# Patient Record
Sex: Female | Born: 1945 | Hispanic: Yes | Smoking: Former smoker
Health system: Southern US, Community
[De-identification: ages and names within clinical notes are randomized; demographics above are authoritative.]

## PROBLEM LIST (undated history)

## (undated) DIAGNOSIS — E079 Disorder of thyroid, unspecified: Secondary | ICD-10-CM

## (undated) DIAGNOSIS — I10 Essential (primary) hypertension: Secondary | ICD-10-CM

## (undated) DIAGNOSIS — I272 Pulmonary hypertension, unspecified: Secondary | ICD-10-CM

## (undated) DIAGNOSIS — J8410 Pulmonary fibrosis, unspecified: Secondary | ICD-10-CM

## (undated) DIAGNOSIS — M3500 Sicca syndrome, unspecified: Secondary | ICD-10-CM

## (undated) HISTORY — DX: Pulmonary fibrosis, unspecified: J84.10

## (undated) HISTORY — DX: Pulmonary hypertension, unspecified: I27.20

## (undated) HISTORY — DX: Sjogren syndrome, unspecified: M35.00

---

## 2012-10-19 ENCOUNTER — Ambulatory Visit: Payer: Self-pay | Admitting: Rheumatology

## 2012-10-20 ENCOUNTER — Ambulatory Visit: Payer: Self-pay | Admitting: Hematology and Oncology

## 2012-10-26 ENCOUNTER — Ambulatory Visit: Payer: Self-pay | Admitting: Hematology and Oncology

## 2012-10-26 LAB — COMPREHENSIVE METABOLIC PANEL
Albumin: 3.7 g/dL (ref 3.4–5.0)
Alkaline Phosphatase: 50 U/L (ref 50–136)
BUN: 15 mg/dL (ref 7–18)
Bilirubin,Total: 0.4 mg/dL (ref 0.2–1.0)
Chloride: 101 mmol/L (ref 98–107)
Co2: 28 mmol/L (ref 21–32)
Creatinine: 0.96 mg/dL (ref 0.60–1.30)
EGFR (Non-African Amer.): >60
Glucose: 162 mg/dL — ABNORMAL HIGH (ref 65–99)
Potassium: 3.7 mmol/L (ref 3.5–5.1)
SGOT(AST): 29 U/L (ref 15–37)
Sodium: 139 mmol/L (ref 136–145)
Total Protein: 9.1 g/dL — ABNORMAL HIGH (ref 6.4–8.2)

## 2012-10-26 LAB — CBC CANCER CENTER
Eosinophil #: 0.3 x10 3/mm (ref 0.0–0.7)
Eosinophil %: 3.2 %
HGB: 13.4 g/dL (ref 12.0–16.0)
Lymphocyte #: 2.9 x10 3/mm (ref 1.0–3.6)
Lymphocyte %: 31.3 %
MCHC: 33.4 g/dL (ref 32.0–36.0)
MCV: 85 fL (ref 80–100)
Monocyte #: 0.7 x10 3/mm (ref 0.2–0.9)
Monocyte %: 7.5 %
Neutrophil #: 5.3 x10 3/mm (ref 1.4–6.5)
Platelet: 305 x10 3/mm (ref 150–440)
RBC: 4.75 10*6/uL (ref 3.80–5.20)

## 2012-10-26 LAB — LACTATE DEHYDROGENASE: LDH: 208 U/L (ref 81–246)

## 2012-10-31 ENCOUNTER — Ambulatory Visit: Payer: Self-pay | Admitting: Hematology and Oncology

## 2012-11-12 ENCOUNTER — Ambulatory Visit: Payer: Self-pay | Admitting: Hematology and Oncology

## 2014-12-27 IMAGING — CT CT NECK-CHEST W/ CM
2 series · 10 of 14 positions shown, 12 images · non-contrast
Comparison: none

REASON FOR EXAM: Abnormal chest XR  SWOLLEN NECK GLANDS
COMMENTS:

[Series 2: soft tissue · axial · 0.66mm/px · z∈[-438,-90]mm · 6 of 164 slices shown, 8 images]
[im 24/164  soft-tissue]
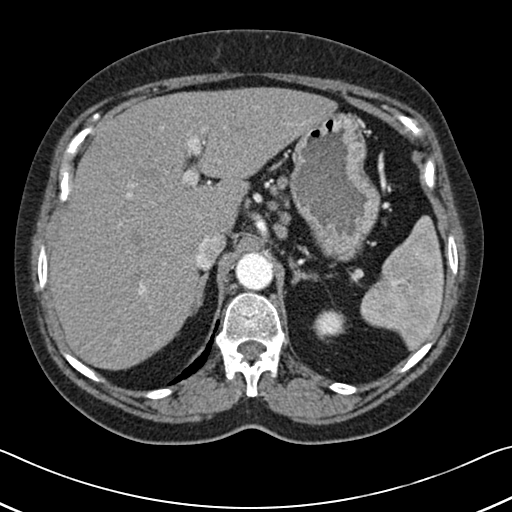
[im 24/164  bone]
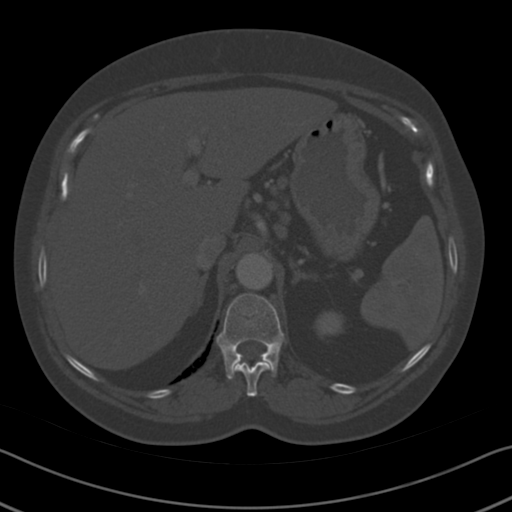
[im 47/164  bone]
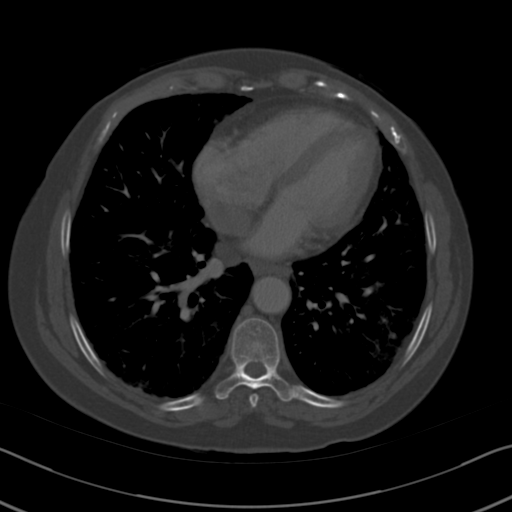
[im 70/164  bone]
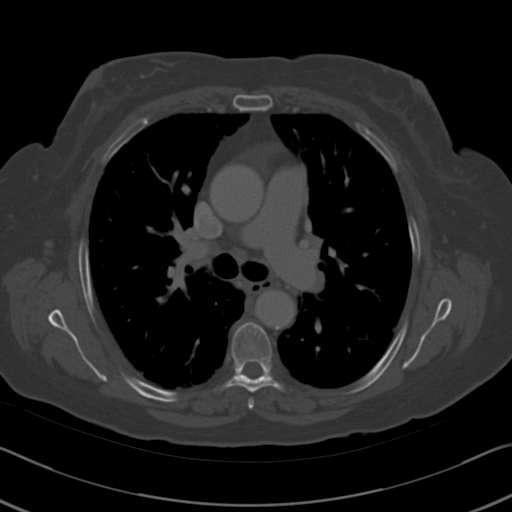
[im 94/164  bone]
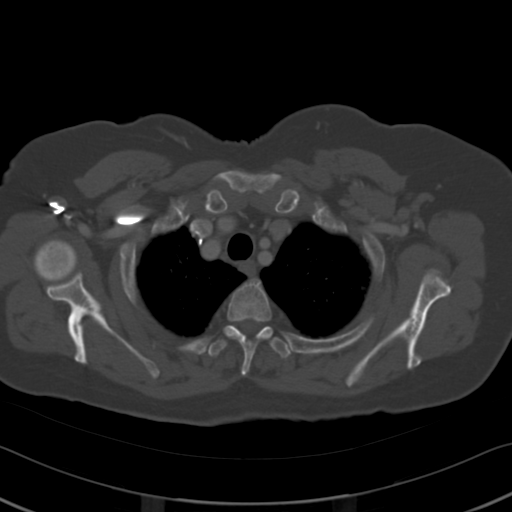
[im 117/164  soft-tissue]
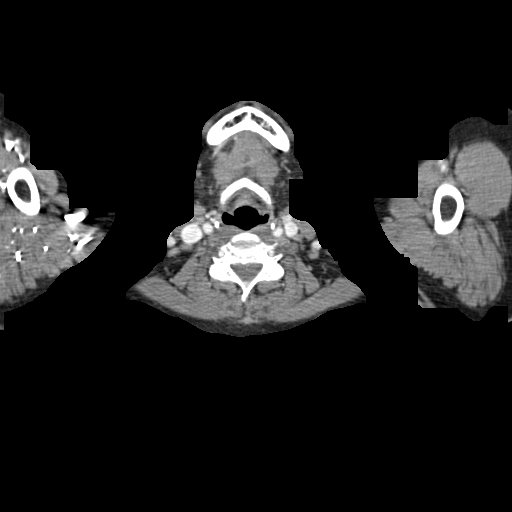
[im 117/164  bone]
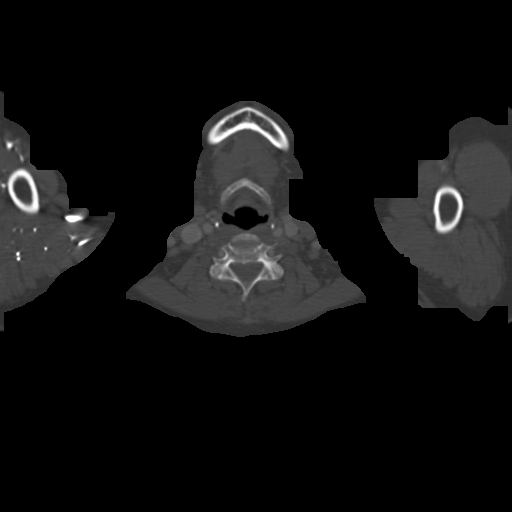
[im 140/164  bone]
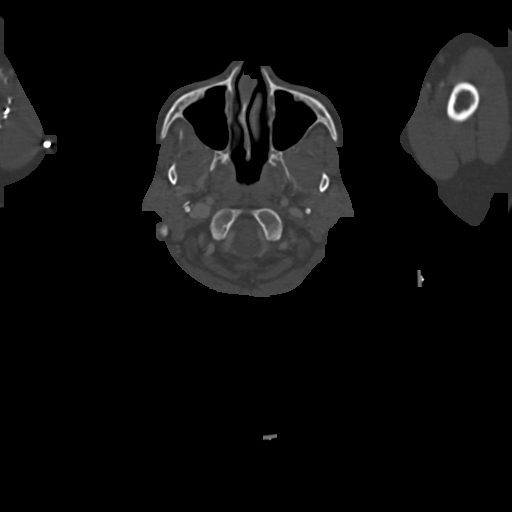

[Series 4: lung windows · axial · 0.66mm/px · z∈[-444,-249]mm · 4 of 109 slices shown]
[im 22/109  bone]
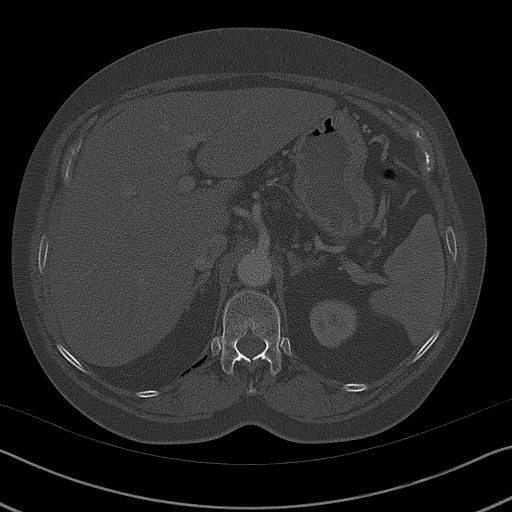
[im 44/109  bone]
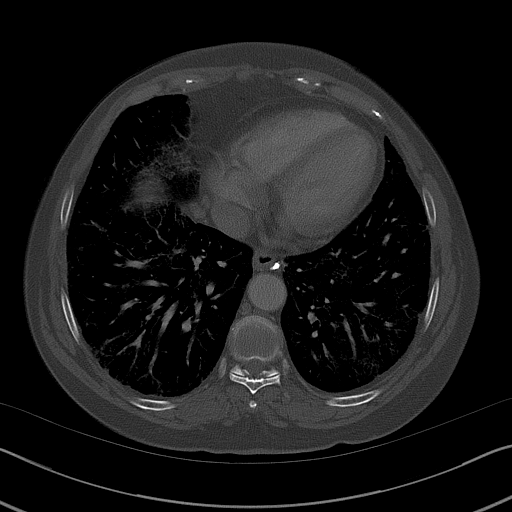
[im 65/109  bone]
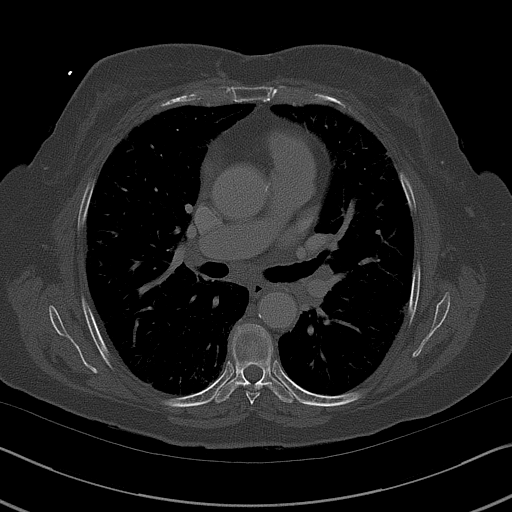
[im 87/109  bone]
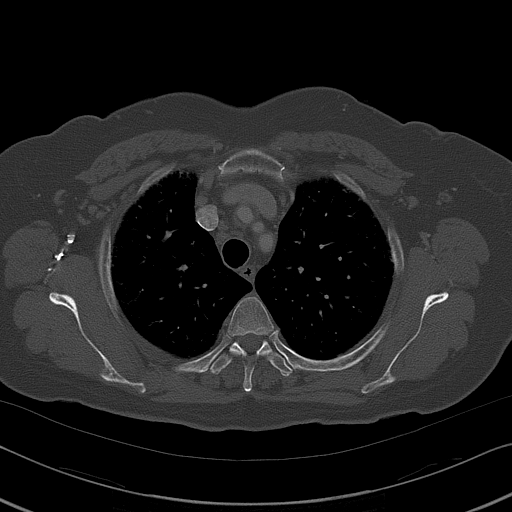

[10 of 14 positions shown; findings below may reference images not displayed]

PROCEDURE:     CT  - CT NECK AND CHEST W  - October 19, 2012  [DATE]

RESULT:     CT of the neck and chest is performed with 80 mL of Msovue-SBS
iodinated intravenous contrast. Images are reconstructed at 3 mm slice
thickness in the axial plane. Multiplanar reconstructions are performed with
the [employer] Via software at the time of interpretation. The patient has no
previous exams for comparison.

Images through the base the brain show no focal abnormal attenuation or
enhancement. The orbital structures and paranasal sinuses appear normal. The
parotid and submandibular salivary glands appear to be normal. The thyroid
lobes enhance homogeneously bilaterally without evidence of enlargement or
mass. The trachea is midline in position. The prevertebral and
parapharyngeal soft tissues appear normal. There are shotty submandibular
and posterior cervical lymph nodes present. No gross cervical adenopathy is
appreciated. There are slightly more prominent mediastinal lymph nodes with
the largest being in the area the precarinal region measuring up to 8 mm
short axis. Subcarinal adenopathy is present. Prominent hilar lymph nodes
measuring 1.4 cm in short axis smaller right hilar lymph nodes present.
There are prominent lymph nodes in the upper abdomen adjacent to the lesser
curvature the stomach and in the porta hepatis. Shotty retroperitoneal lymph
nodes are seen in the included portion of the retroperitoneal region
inferior to the renal arteries. Small nodes are seen adjacent to the greater
curvature the stomach in the left upper quadrant. The spleen is not enlarged
as demonstrated. The kidneys show no discrete mass or obstructive change.
The adrenal glands are unremarkable. There is no pleural or pericardial
effusion. Nonenlarged axillary lymph nodes are present bilaterally. The bony
structures are unremarkable. Lung window images demonstrate some scattered
emphysematous an fibrotic changes. Areas of atelectasis appears present in
the lower lung zones is minimal nodularity on image 70 near the left lung
base with a calcified nodular density seen at mediastinal window settings on
image 126 corresponding to this consistent with granulomatous change. There
is minimal subpleural density on image 62 at lung window settings without
evidence of calcification. This is nonspecific. Followup on subsequent
images is recommended in 6 months. This can be performed without contrast.
This lesion measures up to approximately 5 mm. There is no endobronchial
lesion centrally.

The thoracic aorta is normal in caliber without dissection. No definite
pulmonary embolism is seen within the pulmonary arteries.
IMPRESSION: 1. There does not appear to be severe cervical or supraclavicular
adenopathy. Shotty nodes are present. There is subcarinal adenopathy and
left hilar adenopathy small shotty mediastinal and right hilar lymph nodes
present. Borderline pathologic lymph nodes are seen scattered in the upper
abdomen as described. Correlate with clinical and laboratory data for
underlying process such as lymphoma. Oncologic consultation is recommended.
2. Probable calcified granulomas lesion at the left lung base. Nonspecific
noncalcified lesion in the subpleural left lower lobe. Chronic underlying
lung disease with some areas of fibrosis and/or atelectasis.

[REDACTED]

## 2015-11-21 ENCOUNTER — Emergency Department
Admission: EM | Admit: 2015-11-21 | Discharge: 2015-11-21 | Disposition: A | Discharge status: Home / Self Care | Payer: Self-pay | Attending: Emergency Medicine | Admitting: Emergency Medicine

## 2015-11-21 ENCOUNTER — Emergency Department: Payer: Self-pay

## 2015-11-21 ENCOUNTER — Encounter: Payer: Self-pay | Admitting: Emergency Medicine

## 2015-11-21 DIAGNOSIS — R0600 Dyspnea, unspecified: Secondary | ICD-10-CM | POA: Insufficient documentation

## 2015-11-21 DIAGNOSIS — Z87891 Personal history of nicotine dependence: Secondary | ICD-10-CM | POA: Insufficient documentation

## 2015-11-21 DIAGNOSIS — R002 Palpitations: Secondary | ICD-10-CM | POA: Insufficient documentation

## 2015-11-21 LAB — BASIC METABOLIC PANEL
Anion gap: 8 (ref 5–15)
BUN: 15 mg/dL (ref 6–20)
CALCIUM: 9.7 mg/dL (ref 8.9–10.3)
CO2: 28 mmol/L (ref 22–32)
CREATININE: 0.82 mg/dL (ref 0.44–1.00)
Chloride: 100 mmol/L — ABNORMAL LOW (ref 101–111)
GFR calc Af Amer: >60 mL/min (ref >60)
GFR calc non Af Amer: >60 mL/min (ref >60)
GLUCOSE: 134 mg/dL — AB (ref 65–99)
Potassium: 3.9 mmol/L (ref 3.5–5.1)
Sodium: 136 mmol/L (ref 135–145)

## 2015-11-21 LAB — CBC
HCT: 42.8 % (ref 35.0–47.0)
HEMOGLOBIN: 14.3 g/dL (ref 12.0–16.0)
MCH: 28.4 pg (ref 26.0–34.0)
MCHC: 33.3 g/dL (ref 32.0–36.0)
MCV: 85.3 fL (ref 80.0–100.0)
PLATELETS: 300 10*3/uL (ref 150–440)
RBC: 5.01 MIL/uL (ref 3.80–5.20)
RDW: 14.3 % (ref 11.5–14.5)
WBC: 10 10*3/uL (ref 3.6–11.0)

## 2015-11-21 LAB — TROPONIN I

## 2015-11-21 NOTE — ED Triage Notes (Signed)
[facility] Maryjane Hurtertto, sharp pain mid sternal x4 days

## 2015-11-21 NOTE — ED Notes (Signed)
Interpreter paged to have pt triaged

## 2015-11-21 NOTE — ED Provider Notes (Signed)
[facility] Emergency Department Provider Note  Time seen: 2:38 PM  I have reviewed the triage vital signs and the nursing notes.   HISTORY  Chief Complaint Chest Pain    HPI Dena BilletRosa L Farrel ConnersHernandez De Orellana is a 70 y.o. female who presents to the emergency department for a fast heartbeat and shortness of breath. According to the patient for the past one week she has intermittently felt like her heart is beating fast and she is having trouble breathing. She states the symptoms resolved and she feels normal. She had an episode last night, so her family brought her to the emergency department today for evaluation. Currently the patient states she feels normal, denies any palpitations, denies any chest discomfort or shortness of breath. Patient denies any nausea or diaphoresis, leg pain or swelling at any point. States several days ago she had an episode lasting several hours in which she was feeling short of breath and felt like her heart was racing.  History reviewed. No pertinent past medical history.  There are no active problems to display for this patient.   History reviewed. No pertinent surgical history.  Prior to Admission medications   Not on File    No Known Allergies  No family history on file.  Social History Social History  Substance Use Topics  . Smoking status: Former Games developermoker  . Smokeless tobacco: Never Used  . Alcohol use Not on file    Review of Systems Constitutional: Negative for fever. Cardiovascular: Negative for chest pain.Positive for palpitations/heart racing, resolved. Respiratory: Positive for shortness of breath, resolved. Gastrointestinal: Negative for abdominal pain Neurological: Negative for headache 10-point ROS otherwise negative.  ____________________________________________   PHYSICAL EXAM:  VITAL SIGNS: ED Triage Vitals  Enc Vitals Group     BP 11/21/15 1201 98/66     Pulse Rate 11/21/15 1201 73     Resp  11/21/15 1201 15     Temp 11/21/15 1201 98.2 F (36.8 C)     Temp Source 11/21/15 1201 Oral     SpO2 11/21/15 1201 99 %     Weight 11/21/15 1200 153 lb (69.4 kg)     Height 11/21/15 1200 5' (1.524 m)     Head Circumference --      Peak Flow --      Pain Score 11/21/15 1212 0     Pain Loc --      Pain Edu? --      Excl. in GC? --     Constitutional: Alert and oriented. Well appearing and in no distress. Eyes: Normal exam ENT   Head: Normocephalic and atraumatic.   Mouth/Throat: Mucous membranes are moist. Cardiovascular: Normal rate, regular rhythm. Respiratory: Normal respiratory effort without tachypnea nor retractions. Breath sounds are clear and equal bilaterally. No wheezes/rales/rhonchi. Gastrointestinal: Soft and nontender. No distention.   Musculoskeletal: Nontender with normal range of motion in all extremities. No lower extremity tenderness or edema. Neurologic:  Normal speech and language. No gross focal neurologic deficits  Psychiatric: Mood and affect are normal.   ____________________________________________    EKG  EKG reviewed and interpreted by myself shows normal sinus rhythm at 76 bpm, narrow QRS, normal axis, normal intervals, no concerning ST changes.  ____________________________________________    RADIOLOGY  Chest x-ray shows possible pneumonitis no pulmonary edema  ____________________________________________   INITIAL IMPRESSION / ASSESSMENT AND PLAN / ED COURSE  Pertinent labs & imaging results that were available during my care of the patient were reviewed by me  and considered in my medical decision making (see chart for details).  The patient presents emergency department for intermittent rapid heartbeat and shortness of breath. Patient states she had an episode yesterday, so her family brought her to the emergency department today for evaluation. Has not had any episodes today. States she is feeling normal. Denies any chest pain,  palpitations, shortness of breath. Patient's labs are within normal limits. Troponin is negative. Chest x-ray shows possible pneumonitis. Patient denies cough or fever. Overall the patient appears very well, no complaints at this time. I discussed with the patient and her family the need to see a cardiologist as soon as possible for a Holter monitor. Patient states she will call the number provided today to arrange an appointment as soon as possible. I discussed return precautions for any chest pain, or recurrent symptoms.  ____________________________________________   FINAL CLINICAL IMPRESSION(S) / ED DIAGNOSES  Palpitations Dyspnea    Minna AntisKevin Frenchie Dangerfield, MD 11/21/15 1443

## 2015-11-21 NOTE — Discharge Instructions (Signed)
Please call the number provided for cardiology to arrange the next available appointment for further evaluation and possible Holter monitor. Return to the emergency department for any further episodes of shortness of breath, heart racing, or if you develop any chest pain.

## 2015-12-16 MED ORDER — ONDANSETRON HCL 4 MG/2ML IJ SOLN
INTRAMUSCULAR | Status: AC
  Filled 2015-12-16: qty 2

## 2015-12-16 MED ORDER — MORPHINE SULFATE (PF) 4 MG/ML IV SOLN
INTRAVENOUS | Status: AC
  Filled 2015-12-16: qty 1

## 2016-12-11 ENCOUNTER — Other Ambulatory Visit: Payer: Self-pay

## 2016-12-11 ENCOUNTER — Emergency Department
Admission: EM | Admit: 2016-12-11 | Discharge: 2016-12-11 | Disposition: A | Discharge status: Home / Self Care | Payer: Medicaid Other | Attending: Emergency Medicine | Admitting: Emergency Medicine

## 2016-12-11 ENCOUNTER — Emergency Department: Payer: Medicaid Other

## 2016-12-11 ENCOUNTER — Encounter: Payer: Self-pay | Admitting: *Deleted

## 2016-12-11 DIAGNOSIS — Z87891 Personal history of nicotine dependence: Secondary | ICD-10-CM | POA: Insufficient documentation

## 2016-12-11 DIAGNOSIS — J069 Acute upper respiratory infection, unspecified: Secondary | ICD-10-CM | POA: Insufficient documentation

## 2016-12-11 DIAGNOSIS — R05 Cough: Secondary | ICD-10-CM | POA: Diagnosis present

## 2016-12-11 HISTORY — DX: Disorder of thyroid, unspecified: E07.9

## 2016-12-11 LAB — URINALYSIS, COMPLETE (UACMP) WITH MICROSCOPIC
BACTERIA UA: NONE SEEN
BILIRUBIN URINE: NEGATIVE
Glucose, UA: NEGATIVE mg/dL
Hgb urine dipstick: NEGATIVE
KETONES UR: NEGATIVE mg/dL
Leukocytes, UA: NEGATIVE
Nitrite: NEGATIVE
PROTEIN: NEGATIVE mg/dL
Specific Gravity, Urine: 1.015 (ref 1.005–1.030)
Squamous Epithelial / LPF: NONE SEEN
pH: 5 (ref 5.0–8.0)

## 2016-12-11 LAB — BASIC METABOLIC PANEL
ANION GAP: 9 (ref 5–15)
BUN: 11 mg/dL (ref 6–20)
CALCIUM: 9.4 mg/dL (ref 8.9–10.3)
CO2: 28 mmol/L (ref 22–32)
CREATININE: 0.76 mg/dL (ref 0.44–1.00)
Chloride: 98 mmol/L — ABNORMAL LOW (ref 101–111)
GFR calc non Af Amer: >60 mL/min (ref >60)
Glucose, Bld: 118 mg/dL — ABNORMAL HIGH (ref 65–99)
Potassium: 4 mmol/L (ref 3.5–5.1)
SODIUM: 135 mmol/L (ref 135–145)

## 2016-12-11 LAB — CBC WITH DIFFERENTIAL/PLATELET
BASOS ABS: 0.1 10*3/uL (ref 0–0.1)
BASOS PCT: 1 %
EOS ABS: 0.5 10*3/uL (ref 0–0.7)
Eosinophils Relative: 4 %
HCT: 40.9 % (ref 35.0–47.0)
HEMOGLOBIN: 13.5 g/dL (ref 12.0–16.0)
Lymphocytes Relative: 27 %
Lymphs Abs: 4 10*3/uL — ABNORMAL HIGH (ref 1.0–3.6)
MCH: 27.8 pg (ref 26.0–34.0)
MCHC: 33 g/dL (ref 32.0–36.0)
MCV: 84.3 fL (ref 80.0–100.0)
MONO ABS: 1.4 10*3/uL — AB (ref 0.2–0.9)
Monocytes Relative: 10 %
NEUTROS ABS: 8.5 10*3/uL — AB (ref 1.4–6.5)
NEUTROS PCT: 58 %
Platelets: 323 10*3/uL (ref 150–440)
RBC: 4.86 MIL/uL (ref 3.80–5.20)
RDW: 14.3 % (ref 11.5–14.5)
WBC: 14.6 10*3/uL — ABNORMAL HIGH (ref 3.6–11.0)

## 2016-12-11 MED ORDER — LEVOFLOXACIN 500 MG PO TABS: 500.0000 mg | ORAL_TABLET | Freq: Every day | ORAL | 0 refills | Status: DC

## 2016-12-11 NOTE — ED Triage Notes (Signed)
Pt to ED after PCP sent pt in to have a chest xray to rule out  Pneumonia. Pts PCP reported to family that pts lungs did not sound normal. Pts family reports she has had cough for 3 days with intermittent fevers. No other symptoms reported. Pt denies chest pains or SOB at this time.

## 2016-12-11 NOTE — ED Notes (Signed)
RN spoke to Dr. Shaune PollackLord. Only chest Xray at this time.

## 2016-12-11 NOTE — Discharge Instructions (Signed)
Follow up with your regular doctor in [place], give him the CD with the chest xray and ct results, take the medication as prescribed, wear a mask on the plane so as not to infect other passengers,

## 2016-12-11 NOTE — ED Provider Notes (Signed)
[facility] Emergency Department Provider Note  ____________________________________________   First MD Initiated Contact with Patient 12/11/16 1302     (approximate)  I have reviewed the triage vital signs and the nursing notes.   HISTORY  Chief Complaint Cough    HPI Rosa L Farrel ConnersHernandez De Orellana is a 71 y.o. female who was sent to the ED by her primary care provider for a chest x-ray, she has had a cough and congestion for over a week with a low-grade temp, her doctor was concerned that her lungs did not sound right, she denies chest pain or shortness of breath, she is going to [place] tomorrow for 6 months, she denies vomiting, diarrhea, urinary problems, she is here with her daughter   Past Medical History:  Diagnosis Date  . Thyroid disease     There are no active problems to display for this patient.   History reviewed. No pertinent surgical history.  Prior to Admission medications   Medication Sig Start Date End Date Taking? Authorizing Provider  levofloxacin (LEVAQUIN) 500 MG tablet Take 1 tablet (500 mg total) by mouth daily. 12/11/16   Faythe GheeFisher, Susan W, PA-C    Allergies Patient has no known allergies.  History reviewed. No pertinent family history.  Social History Social History   Tobacco Use  . Smoking status: Former Games developermoker  . Smokeless tobacco: Never Used  Substance Use Topics  . Alcohol use: No    Frequency: Never  . Drug use: No    Review of Systems  Constitutional: Positive fever/chills Eyes: No visual changes. ENT: No sore throat. Positive runny nose and congestion with sinus congestion Respiratory: Positive cough Genitourinary: Negative for dysuria. Musculoskeletal: Negative for back pain. Skin: Negative for rash.    ____________________________________________   PHYSICAL EXAM:  VITAL SIGNS: ED Triage Vitals  Enc Vitals Group     BP 12/11/16 1248 116/79     Pulse Rate 12/11/16 1248 87     Resp  12/11/16 1248 18     Temp 12/11/16 1248 99.3 F (37.4 C)     Temp Source 12/11/16 1248 Oral     SpO2 12/11/16 1248 95 %     Weight 12/11/16 1249 166 lb (75.3 kg)     Height 12/11/16 1249 5\' 3"  (1.6 m)     Head Circumference --      Peak Flow --      Pain Score 12/11/16 1602 5     Pain Loc --      Pain Edu? --      Excl. in GC? --     Constitutional: Alert and oriented. Well appearing and in no acute distress. Eyes: Conjunctivae are normal.  Head: Atraumatic. Nose: No congestion/rhinnorhea. Mouth/Throat: Mucous membranes are moist.  Sinuses are tender to palpation Cardiovascular: Normal rate, regular rhythm. Heart sounds are normal Respiratory: Normal respiratory effort.  No retractions, lungs have coarse breath sounds GU: deferred Musculoskeletal: FROM all extremities, warm and well perfused Neurologic:  Normal speech and language.  Skin:  Skin is warm, dry and intact. No rash noted. Psychiatric: Mood and affect are normal. Speech and behavior are normal.  ____________________________________________   LABS (all labs ordered are listed, but only abnormal results are displayed)  Labs Reviewed  URINALYSIS, COMPLETE (UACMP) WITH MICROSCOPIC - Abnormal; Notable for the following components:      Result Value   Color, Urine YELLOW (*)    APPearance CLEAR (*)    All other components within normal limits  CBC WITH DIFFERENTIAL/PLATELET - Abnormal; Notable for the following components:   WBC 14.6 (*)    Neutro Abs 8.5 (*)    Lymphs Abs 4.0 (*)    Monocytes Absolute 1.4 (*)    All other components within normal limits  BASIC METABOLIC PANEL - Abnormal; Notable for the following components:   Chloride 98 (*)    Glucose, Bld 118 (*)    All other components within normal limits   ____________________________________________   ____________________________________________  RADIOLOGY  Chest x-ray was inconclusive, CT of the chest showed chronic interstitial disease,    ____________________________________________   PROCEDURES  Procedure(s) performed: No      ____________________________________________   INITIAL IMPRESSION / ASSESSMENT AND PLAN / ED COURSE  Pertinent labs & imaging results that were available during my care of the patient were reviewed by me and considered in my medical decision making (see chart for details).  Patient is a 71 year old female that is here with her daughter, she is traveling to [place] tomorrow, her doctor was concerned about her lung sounds and sent her here for evaluation, chest x-ray was inconclusive, chest showed interstitial lung disease, did not show any sign of pneumonia, her white blood cell count was elevated at 14.6, she did have nitrites in her urine, she appears stable, discussed the care plan with her daughter, due to the patient's elevated white count, fever, will prescribe an antibiotic of Levaquin 500 mg daily for 10 days, due to her status of living and [place] for the next 3-6 months, a CT of her chest x-ray and chest CT was given, a copy of her lab work was given, and she was instructed to follow-up with her doctor and [place], she states she understands, she will follow-up as instructed,  Interpreter Annice PihJackie was presents and interpreted all information      ____________________________________________   FINAL CLINICAL IMPRESSION(S) / ED DIAGNOSES  Final diagnoses:  Acute upper respiratory infection      NEW MEDICATIONS STARTED DURING THIS VISIT:  This SmartLink is deprecated. Use AVSMEDLIST instead to display the medication list for a patient.   Note:  This document was prepared using Dragon voice recognition software and may include unintentional dictation errors.    Faythe GheeFisher, Susan W, PA-C 12/11/16 1645    Minna AntisPaduchowski, Kevin, MD 12/11/16 2245

## 2017-08-16 ENCOUNTER — Other Ambulatory Visit: Payer: Self-pay | Admitting: Physician Assistant

## 2017-08-16 DIAGNOSIS — K118 Other diseases of salivary glands: Secondary | ICD-10-CM

## 2017-08-23 ENCOUNTER — Ambulatory Visit: Payer: Medicaid Other

## 2017-08-25 ENCOUNTER — Ambulatory Visit: Payer: Medicaid Other

## 2017-08-30 ENCOUNTER — Ambulatory Visit
Admission: RE | Admit: 2017-08-30 | Discharge: 2017-08-30 | Disposition: A | Discharge status: Home / Self Care | Payer: Medicaid Other | Source: Ambulatory Visit | Attending: Physician Assistant | Admitting: Physician Assistant

## 2017-08-30 DIAGNOSIS — K118 Other diseases of salivary glands: Secondary | ICD-10-CM | POA: Insufficient documentation

## 2017-11-06 ENCOUNTER — Encounter: Payer: Self-pay | Admitting: Emergency Medicine

## 2017-11-06 ENCOUNTER — Emergency Department: Payer: Medicaid Other

## 2017-11-06 ENCOUNTER — Other Ambulatory Visit: Payer: Self-pay

## 2017-11-06 ENCOUNTER — Inpatient Hospital Stay
Admission: EM | Admit: 2017-11-06 | Discharge: 2017-11-08 | DRG: 316 | Disposition: A | Discharge status: Home / Self Care | Payer: Medicaid Other | Attending: Internal Medicine | Admitting: Internal Medicine

## 2017-11-06 DIAGNOSIS — M3500 Sicca syndrome, unspecified: Secondary | ICD-10-CM | POA: Diagnosis present

## 2017-11-06 DIAGNOSIS — E039 Hypothyroidism, unspecified: Secondary | ICD-10-CM | POA: Diagnosis present

## 2017-11-06 DIAGNOSIS — M349 Systemic sclerosis, unspecified: Secondary | ICD-10-CM | POA: Diagnosis present

## 2017-11-06 DIAGNOSIS — Z8249 Family history of ischemic heart disease and other diseases of the circulatory system: Secondary | ICD-10-CM

## 2017-11-06 DIAGNOSIS — I10 Essential (primary) hypertension: Secondary | ICD-10-CM | POA: Diagnosis present

## 2017-11-06 DIAGNOSIS — R079 Chest pain, unspecified: Secondary | ICD-10-CM | POA: Diagnosis present

## 2017-11-06 DIAGNOSIS — Z87891 Personal history of nicotine dependence: Secondary | ICD-10-CM

## 2017-11-06 DIAGNOSIS — R590 Localized enlarged lymph nodes: Secondary | ICD-10-CM | POA: Diagnosis present

## 2017-11-06 DIAGNOSIS — J8410 Pulmonary fibrosis, unspecified: Secondary | ICD-10-CM | POA: Diagnosis present

## 2017-11-06 DIAGNOSIS — D649 Anemia, unspecified: Secondary | ICD-10-CM | POA: Diagnosis present

## 2017-11-06 DIAGNOSIS — Z23 Encounter for immunization: Secondary | ICD-10-CM

## 2017-11-06 DIAGNOSIS — I959 Hypotension, unspecified: Secondary | ICD-10-CM | POA: Diagnosis not present

## 2017-11-06 DIAGNOSIS — I319 Disease of pericardium, unspecified: Principal | ICD-10-CM | POA: Diagnosis present

## 2017-11-06 DIAGNOSIS — I493 Ventricular premature depolarization: Secondary | ICD-10-CM | POA: Diagnosis present

## 2017-11-06 DIAGNOSIS — I2510 Atherosclerotic heart disease of native coronary artery without angina pectoris: Secondary | ICD-10-CM | POA: Diagnosis present

## 2017-11-06 HISTORY — DX: Essential (primary) hypertension: I10

## 2017-11-06 LAB — BASIC METABOLIC PANEL
Anion gap: 10 (ref 5–15)
BUN: 14 mg/dL (ref 8–23)
CO2: 26 mmol/L (ref 22–32)
CREATININE: 0.91 mg/dL (ref 0.44–1.00)
Calcium: 9 mg/dL (ref 8.9–10.3)
Chloride: 102 mmol/L (ref 98–111)
Glucose, Bld: 104 mg/dL — ABNORMAL HIGH (ref 70–99)
POTASSIUM: 3.6 mmol/L (ref 3.5–5.1)
SODIUM: 138 mmol/L (ref 135–145)

## 2017-11-06 LAB — CBC
HCT: 39.2 % (ref 36.0–46.0)
Hemoglobin: 12.3 g/dL (ref 12.0–15.0)
MCH: 27.3 pg (ref 26.0–34.0)
MCHC: 31.4 g/dL (ref 30.0–36.0)
MCV: 87.1 fL (ref 80.0–100.0)
NRBC: 0 % (ref 0.0–0.2)
Platelets: 299 10*3/uL (ref 150–400)
RBC: 4.5 MIL/uL (ref 3.87–5.11)
RDW: 14.5 % (ref 11.5–15.5)
WBC: 11 10*3/uL — AB (ref 4.0–10.5)

## 2017-11-06 LAB — TROPONIN I

## 2017-11-06 LAB — FIBRIN DERIVATIVES D-DIMER (ARMC ONLY): FIBRIN DERIVATIVES D-DIMER (ARMC): 377.98 ng{FEU}/mL (ref 0.00–499.00)

## 2017-11-06 MED ORDER — ASPIRIN 81 MG PO CHEW
324.0000 mg | CHEWABLE_TABLET | Freq: Once | ORAL | Status: AC
  Administered 2017-11-06: 324 mg via ORAL
  Filled 2017-11-06: qty 4

## 2017-11-06 MED ORDER — AMIODARONE HCL IN DEXTROSE 360-4.14 MG/200ML-% IV SOLN
30.0000 mg/h | INTRAVENOUS | Status: DC
  Filled 2017-11-06: qty 200

## 2017-11-06 MED ORDER — AMIODARONE HCL IN DEXTROSE 360-4.14 MG/200ML-% IV SOLN
60.0000 mg/h | INTRAVENOUS | Status: DC
  Administered 2017-11-06: 60 mg/h via INTRAVENOUS
  Filled 2017-11-06: qty 200

## 2017-11-06 MED ORDER — AMIODARONE LOAD VIA INFUSION
150.0000 mg | Freq: Once | INTRAVENOUS | Status: AC
  Administered 2017-11-06: 150 mg via INTRAVENOUS
  Filled 2017-11-06 (×3): qty 83.34

## 2017-11-06 NOTE — ED Provider Notes (Signed)
[facility] Emergency Department Provider Note       Time seen: ----------------------------------------- 9:34 PM on 11/06/2017 -----------------------------------------   I have reviewed the triage vital signs and the nursing notes.  HISTORY   Chief Complaint Chest Pain    HPI Denise [name] is a 72 y.o. female with a history of thyroid disease who presents to the ED for chest pain since last night.  Patient describes pleuritic pain, was told in the past that she had several blockages.  She was noted to be bradycardic on arrival.  She states she has had fever and pain with breathing.  She denies vomiting or diarrhea.  Past Medical History:  Diagnosis Date  . Thyroid disease     There are no active problems to display for this patient.   History reviewed. No pertinent surgical history.  Allergies Patient has no known allergies.  Social History Social History   Tobacco Use  . Smoking status: Former Games developer  . Smokeless tobacco: Never Used  Substance Use Topics  . Alcohol use: No    Frequency: Never  . Drug use: No   Review of Systems Constitutional: Positive for fever Cardiovascular: Positive for chest pain Respiratory: Positive shortness of breath and pain with breathing Gastrointestinal: Negative for abdominal pain, vomiting and diarrhea. Musculoskeletal: Negative for back pain. Skin: Negative for rash. Neurological: Negative for headaches, focal weakness or numbness.  All systems negative/normal/unremarkable except as stated in the HPI  ____________________________________________   PHYSICAL EXAM:  VITAL SIGNS: ED Triage Vitals  Enc Vitals Group     BP 11/06/17 2108 124/84     Pulse Rate 11/06/17 2108 (!) 43     Resp 11/06/17 2108 18     Temp 11/06/17 2108 100 F (37.8 C)     Temp Source 11/06/17 2108 Oral     SpO2 11/06/17 2108 97 %     Weight 11/06/17 2112 150 lb (68 kg)     Height 11/06/17 2112 5\' 1"   (1.549 m)     Head Circumference --      Peak Flow --      Pain Score 11/06/17 2112 10     Pain Loc --      Pain Edu? --      Excl. in GC? --    Constitutional: Alert and oriented.  Mild distress ENT   Head: Normocephalic and atraumatic.   Nose: No congestion/rhinnorhea.   Mouth/Throat: Mucous membranes are moist.   Neck: No stridor. Cardiovascular: Normal rate, regular rhythm. No murmurs, rubs, or gallops. Respiratory: Normal respiratory effort without tachypnea nor retractions. Breath sounds are clear and equal bilaterally. No wheezes/rales/rhonchi. Gastrointestinal: Soft and nontender. Normal bowel sounds Musculoskeletal: Nontender with normal range of motion in extremities. No lower extremity tenderness nor edema. Neurologic:  Normal speech and language. No gross focal neurologic deficits are appreciated.  Skin:  Skin is warm, dry and intact. No rash noted. Psychiatric: Mood and affect are normal. Speech and behavior are normal.  ____________________________________________  EKG: Interpreted by me.  Sinus rhythm rate 86 bpm, normal PR interval, normal QRS, normal QT  ____________________________________________  ED COURSE:  As part of my medical decision making, I reviewed the following data within the electronic MEDICAL RECORD NUMBER History obtained from family if available, nursing notes, old chart and ekg, as well as notes from prior ED visits. Patient presented for chest pain, we will assess with labs and imaging as indicated at this time.   Procedures ____________________________________________  LABS (pertinent positives/negatives)  Labs Reviewed  BASIC METABOLIC PANEL - Abnormal; Notable for the following components:      Result Value   Glucose, Bld 104 (*)    All other components within normal limits  CBC - Abnormal; Notable for the following components:   WBC 11.0 (*)    All other components within normal limits  TROPONIN I  FIBRIN DERIVATIVES  D-DIMER ([facility] ONLY)   CRITICAL CARE Performed by: Ulice Dash   Total critical care time: 30 minutes  Critical care time was exclusive of separately billable procedures and treating other patients.  Critical care was necessary to treat or prevent imminent or life-threatening deterioration.  Critical care was time spent personally by me on the following activities: development of treatment plan with patient and/or surrogate as well as nursing, discussions with consultants, evaluation of patient's response to treatment, examination of patient, obtaining history from patient or surrogate, ordering and performing treatments and interventions, ordering and review of laboratory studies, ordering and review of radiographic studies, pulse oximetry and re-evaluation of patient's condition.  RADIOLOGY Images were viewed by me  Chest x-ray IMPRESSION: 1. No acute cardiopulmonary process. 2. Chronic changes of interstitial lung disease. ____________________________________________  DIFFERENTIAL DIAGNOSIS   PE, musculoskeletal pain, GERD, MI, arrhythmia, unstable angina  FINAL ASSESSMENT AND PLAN  Chest pain  Plan: The patient had presented for chest pain. Patient's labs did not reveal any acute process. Patient's imaging was negative.  She has chronic interstitial lung disease but no acute findings.  No clear etiology to her pain at this point.  I did place her on amiodarone because she had numerous PVCs and couplets as well as bigeminy.  She was also given aspirin.  I will discuss with the hospitalist for admission.   Ulice Dash, MD   Note: This note was generated in part or whole with voice recognition software. Voice recognition is usually quite accurate but there are transcription errors that can and very often do occur. I apologize for any typographical errors that were not detected and corrected.     Emily Filbert, MD 11/06/17 [phone]

## 2017-11-06 NOTE — ED Triage Notes (Signed)
Pt arrives ambulatory to triage with c/o chest pain since last night. Pt denies other cardiac symptoms at this time but states that she has been told that she has several blockages. Pt is bradycardic in triage with pulses dipping in the upper 30's at this time.

## 2017-11-06 NOTE — ED Notes (Signed)
Cardiac/defib Pads placed on patient

## 2017-11-07 ENCOUNTER — Inpatient Hospital Stay: Payer: Medicaid Other

## 2017-11-07 ENCOUNTER — Encounter: Payer: Self-pay | Admitting: Radiology

## 2017-11-07 ENCOUNTER — Other Ambulatory Visit: Payer: Self-pay

## 2017-11-07 ENCOUNTER — Inpatient Hospital Stay (HOSPITAL_COMMUNITY)
Admit: 2017-11-07 | Discharge: 2017-11-07 | Disposition: A | Discharge status: Home / Self Care | Payer: Medicaid Other | Attending: Internal Medicine | Admitting: Internal Medicine

## 2017-11-07 DIAGNOSIS — J8410 Pulmonary fibrosis, unspecified: Secondary | ICD-10-CM | POA: Diagnosis present

## 2017-11-07 DIAGNOSIS — I493 Ventricular premature depolarization: Secondary | ICD-10-CM | POA: Diagnosis present

## 2017-11-07 DIAGNOSIS — Z8249 Family history of ischemic heart disease and other diseases of the circulatory system: Secondary | ICD-10-CM | POA: Diagnosis not present

## 2017-11-07 DIAGNOSIS — I10 Essential (primary) hypertension: Secondary | ICD-10-CM | POA: Diagnosis present

## 2017-11-07 DIAGNOSIS — R079 Chest pain, unspecified: Secondary | ICD-10-CM | POA: Diagnosis present

## 2017-11-07 DIAGNOSIS — I319 Disease of pericardium, unspecified: Secondary | ICD-10-CM | POA: Diagnosis present

## 2017-11-07 DIAGNOSIS — D649 Anemia, unspecified: Secondary | ICD-10-CM | POA: Diagnosis present

## 2017-11-07 DIAGNOSIS — M3500 Sicca syndrome, unspecified: Secondary | ICD-10-CM | POA: Diagnosis present

## 2017-11-07 DIAGNOSIS — I2510 Atherosclerotic heart disease of native coronary artery without angina pectoris: Secondary | ICD-10-CM | POA: Diagnosis present

## 2017-11-07 DIAGNOSIS — M349 Systemic sclerosis, unspecified: Secondary | ICD-10-CM | POA: Diagnosis present

## 2017-11-07 DIAGNOSIS — E039 Hypothyroidism, unspecified: Secondary | ICD-10-CM | POA: Diagnosis present

## 2017-11-07 DIAGNOSIS — I959 Hypotension, unspecified: Secondary | ICD-10-CM

## 2017-11-07 DIAGNOSIS — Z23 Encounter for immunization: Secondary | ICD-10-CM | POA: Diagnosis not present

## 2017-11-07 DIAGNOSIS — Z87891 Personal history of nicotine dependence: Secondary | ICD-10-CM | POA: Diagnosis not present

## 2017-11-07 DIAGNOSIS — R590 Localized enlarged lymph nodes: Secondary | ICD-10-CM

## 2017-11-07 DIAGNOSIS — R0781 Pleurodynia: Secondary | ICD-10-CM

## 2017-11-07 DIAGNOSIS — R071 Chest pain on breathing: Secondary | ICD-10-CM | POA: Diagnosis not present

## 2017-11-07 LAB — URINALYSIS, COMPLETE (UACMP) WITH MICROSCOPIC
Bacteria, UA: NONE SEEN
Bilirubin Urine: NEGATIVE
GLUCOSE, UA: NEGATIVE mg/dL
Hgb urine dipstick: NEGATIVE
Ketones, ur: NEGATIVE mg/dL
NITRITE: NEGATIVE
Protein, ur: NEGATIVE mg/dL
pH: 5 (ref 5.0–8.0)

## 2017-11-07 LAB — BASIC METABOLIC PANEL
ANION GAP: 9 (ref 5–15)
BUN: 16 mg/dL (ref 8–23)
CHLORIDE: 104 mmol/L (ref 98–111)
CO2: 25 mmol/L (ref 22–32)
Calcium: 8.5 mg/dL — ABNORMAL LOW (ref 8.9–10.3)
Creatinine, Ser: 0.94 mg/dL (ref 0.44–1.00)
GFR calc non Af Amer: >60 mL/min (ref >60)
Glucose, Bld: 147 mg/dL — ABNORMAL HIGH (ref 70–99)
POTASSIUM: 3.4 mmol/L — AB (ref 3.5–5.1)
SODIUM: 138 mmol/L (ref 135–145)

## 2017-11-07 LAB — CBC
HEMATOCRIT: 36.6 % (ref 36.0–46.0)
Hemoglobin: 11.5 g/dL — ABNORMAL LOW (ref 12.0–15.0)
MCH: 27.6 pg (ref 26.0–34.0)
MCHC: 31.4 g/dL (ref 30.0–36.0)
MCV: 87.8 fL (ref 80.0–100.0)
NRBC: 0 % (ref 0.0–0.2)
Platelets: 268 10*3/uL (ref 150–400)
RBC: 4.17 MIL/uL (ref 3.87–5.11)
RDW: 14.5 % (ref 11.5–15.5)
WBC: 10.9 10*3/uL — AB (ref 4.0–10.5)

## 2017-11-07 LAB — LACTIC ACID, PLASMA
Lactic Acid, Venous: 2.3 mmol/L (ref 0.5–1.9)
Lactic Acid, Venous: 1.8 mmol/L (ref 0.5–1.9)

## 2017-11-07 LAB — MAGNESIUM: MAGNESIUM: 1.9 mg/dL (ref 1.7–2.4)

## 2017-11-07 LAB — ECHOCARDIOGRAM COMPLETE
HEIGHTINCHES: 61 in
Weight: 2384 oz

## 2017-11-07 LAB — GLUCOSE, CAPILLARY: Glucose-Capillary: 126 mg/dL — ABNORMAL HIGH (ref 70–99)

## 2017-11-07 LAB — TROPONIN I: Troponin I: <0.03 ng/mL (ref <0.03)

## 2017-11-07 LAB — SEDIMENTATION RATE: SED RATE: 65 mm/h — AB (ref 0–30)

## 2017-11-07 MED ORDER — PANTOPRAZOLE SODIUM 20 MG PO TBEC
20.0000 mg | DELAYED_RELEASE_TABLET | Freq: Every day | ORAL | Status: DC
  Administered 2017-11-07 – 2017-11-08 (×2): 20 mg via ORAL
  Filled 2017-11-07 (×2): qty 1

## 2017-11-07 MED ORDER — TRAZODONE HCL 50 MG PO TABS: 25.0000 mg | ORAL_TABLET | Freq: Every evening | ORAL | Status: DC | PRN

## 2017-11-07 MED ORDER — ACETAMINOPHEN 650 MG RE SUPP: 650.0000 mg | Freq: Four times a day (QID) | RECTAL | Status: DC | PRN

## 2017-11-07 MED ORDER — BISACODYL 5 MG PO TBEC: 5.0000 mg | DELAYED_RELEASE_TABLET | Freq: Every day | ORAL | Status: DC | PRN

## 2017-11-07 MED ORDER — SODIUM CHLORIDE 0.9 % IV SOLN
500.0000 mg | INTRAVENOUS | Status: DC
  Administered 2017-11-07: 500 mg via INTRAVENOUS
  Filled 2017-11-07 (×2): qty 500

## 2017-11-07 MED ORDER — HYDROXYCHLOROQUINE SULFATE 200 MG PO TABS
200.0000 mg | ORAL_TABLET | Freq: Every day | ORAL | Status: DC
  Administered 2017-11-07 – 2017-11-08 (×2): 200 mg via ORAL
  Filled 2017-11-07 (×2): qty 1

## 2017-11-07 MED ORDER — ASPIRIN EC 81 MG PO TBEC
81.0000 mg | DELAYED_RELEASE_TABLET | Freq: Every day | ORAL | Status: DC
  Administered 2017-11-08: 81 mg via ORAL
  Filled 2017-11-07: qty 1

## 2017-11-07 MED ORDER — ONDANSETRON HCL 4 MG PO TABS: 4.0000 mg | ORAL_TABLET | Freq: Four times a day (QID) | ORAL | Status: DC | PRN

## 2017-11-07 MED ORDER — POTASSIUM CHLORIDE CRYS ER 20 MEQ PO TBCR
40.0000 meq | EXTENDED_RELEASE_TABLET | ORAL | Status: AC
  Administered 2017-11-07 (×2): 40 meq via ORAL
  Filled 2017-11-07 (×2): qty 2

## 2017-11-07 MED ORDER — IOHEXOL 350 MG/ML SOLN
75.0000 mL | Freq: Once | INTRAVENOUS | Status: AC | PRN
  Administered 2017-11-07: 75 mL via INTRAVENOUS

## 2017-11-07 MED ORDER — INFLUENZA VAC SPLIT HIGH-DOSE 0.5 ML IM SUSY
0.5000 mL | PREFILLED_SYRINGE | INTRAMUSCULAR | Status: AC
  Administered 2017-11-08: 0.5 mL via INTRAMUSCULAR
  Filled 2017-11-07: qty 0.5

## 2017-11-07 MED ORDER — SODIUM CHLORIDE 0.9 % IV SOLN
1.0000 g | INTRAVENOUS | Status: DC
  Administered 2017-11-07: 1 g via INTRAVENOUS
  Filled 2017-11-07: qty 1
  Filled 2017-11-07: qty 10

## 2017-11-07 MED ORDER — NAPROXEN 500 MG PO TABS
500.0000 mg | ORAL_TABLET | Freq: Two times a day (BID) | ORAL | Status: DC
  Administered 2017-11-07 – 2017-11-08 (×3): 500 mg via ORAL
  Filled 2017-11-07 (×4): qty 1

## 2017-11-07 MED ORDER — MAGNESIUM SULFATE 2 GM/50ML IV SOLN
2.0000 g | Freq: Once | INTRAVENOUS | Status: AC
  Administered 2017-11-07: 2 g via INTRAVENOUS
  Filled 2017-11-07: qty 50

## 2017-11-07 MED ORDER — DIPHENHYDRAMINE HCL 50 MG/ML IJ SOLN: 12.5000 mg | Freq: Once | INTRAMUSCULAR | Status: DC

## 2017-11-07 MED ORDER — HEPARIN SODIUM (PORCINE) 5000 UNIT/ML IJ SOLN
5000.0000 [IU] | Freq: Three times a day (TID) | INTRAMUSCULAR | Status: DC
  Administered 2017-11-07 – 2017-11-08 (×3): 5000 [IU] via SUBCUTANEOUS
  Filled 2017-11-07 (×3): qty 1

## 2017-11-07 MED ORDER — ONDANSETRON HCL 4 MG/2ML IJ SOLN
4.0000 mg | Freq: Four times a day (QID) | INTRAMUSCULAR | Status: DC | PRN
  Administered 2017-11-07 (×3): 4 mg via INTRAVENOUS
  Filled 2017-11-07 (×3): qty 2

## 2017-11-07 MED ORDER — HYDROCODONE-ACETAMINOPHEN 5-325 MG PO TABS
1.0000 | ORAL_TABLET | ORAL | Status: DC | PRN
  Administered 2017-11-07: 2 via ORAL
  Administered 2017-11-07: 1 via ORAL
  Filled 2017-11-07: qty 2
  Filled 2017-11-07: qty 1

## 2017-11-07 MED ORDER — LEVOTHYROXINE SODIUM 100 MCG PO TABS
100.0000 ug | ORAL_TABLET | Freq: Every day | ORAL | Status: DC
  Administered 2017-11-08: 100 ug via ORAL
  Filled 2017-11-07: qty 1

## 2017-11-07 MED ORDER — ACETAMINOPHEN 325 MG PO TABS
650.0000 mg | ORAL_TABLET | Freq: Four times a day (QID) | ORAL | Status: DC | PRN
  Administered 2017-11-07 – 2017-11-08 (×2): 650 mg via ORAL
  Filled 2017-11-07 (×2): qty 2

## 2017-11-07 MED ORDER — DOCUSATE SODIUM 100 MG PO CAPS
100.0000 mg | ORAL_CAPSULE | Freq: Two times a day (BID) | ORAL | Status: DC
  Administered 2017-11-07 – 2017-11-08 (×3): 100 mg via ORAL
  Filled 2017-11-07 (×3): qty 1

## 2017-11-07 MED ORDER — SODIUM CHLORIDE 0.9 % IV SOLN
Freq: Once | INTRAVENOUS | Status: AC
  Administered 2017-11-07: 04:00:00 via INTRAVENOUS

## 2017-11-07 MED ORDER — SODIUM CHLORIDE 0.9 % IV SOLN
INTRAVENOUS | Status: AC
  Administered 2017-11-07: 21:00:00 via INTRAVENOUS

## 2017-11-07 MED ORDER — PNEUMOCOCCAL VAC POLYVALENT 25 MCG/0.5ML IJ INJ
0.5000 mL | INJECTION | INTRAMUSCULAR | Status: AC
  Administered 2017-11-08: 0.5 mL via INTRAMUSCULAR
  Filled 2017-11-07: qty 0.5

## 2017-11-07 NOTE — Consult Note (Signed)
Cardiology Consultation:   Patient ID: Denise [name] MRN: [id number]; DOB: Jul 24, 1945  Admit date: 11/06/2017 Date of Consult: 11/07/2017  Primary Care Provider: [facility] Primary Cardiologist: New - Consult by Starlynn Klinkner Primary Electrophysiologist:  None    Patient Profile:   Denise [name] is a 72 y.o. female with a hx of Sjogren's disease, who is being seen today for the evaluation of chest pain at the request of Dr. Caryn Bee.  History is obtained with assistance of Spanish interpreter.  History of Present Illness:   Denise [name] developed acute onset of sharp, substernal chest pain at rest last night.  Pain was worse with deep inspiration and when lying flat.  She denies shortness of breath with this chest pain but was feeling nauseated and lightheaded overnight.  This resolved with Zofran.  She currently continues to feel "small" chest pain, though she rates it at 8/10 in intensity..  In the ED, her pulse was found to be in the 30's at triage, with telemetry showing frequent PVC's.  She also reports subjective fevers.  The patient was started on IV amiodarone by the ED physician with resultant hypotension but no significant improvement in PVC burden.  CTA of the chest was negative for PE but showed pulmonary fibrosis, hilar and mediastinal lymphadenopathy, and cardiomegaly.  Coronary artery calcification is also present.  Troponin was <0.03 x 1.  The patient states that she was told that her heart was not beating well earlier this year by a cardiologist in [place]  She also reports that she had an echocardiogram within the last year in [place], though I see no record of this.  Per her report, further cardiac evaluation was recommended but this was never performed.  Over the last few days, [name] has noted exertional dyspnea.  She also had a stabbing substernal chest pain a few days ago that resolved spontaneously.   She denies orthopnea, PND, and edema.  Past Medical History:  Diagnosis Date  . Hypertension   . Thyroid disease     History reviewed. No pertinent surgical history.   Home Medications:  Prior to Admission medications   Medication Sig Start Date Edwing Figley Date Taking? Authorizing Provider  levofloxacin (LEVAQUIN) 500 MG tablet Take 1 tablet (500 mg total) by mouth daily. 12/11/16   Faythe Ghee, PA-C    Inpatient Medications: Scheduled Meds: . diphenhydrAMINE  12.5 mg Intravenous Once  . docusate sodium  100 mg Oral BID  . heparin  5,000 Units Subcutaneous Q8H  . [START ON 11/08/2017] Influenza vac split quadrivalent PF  0.5 mL Intramuscular Tomorrow-1000  . [START ON 11/08/2017] pneumococcal 23 valent vaccine  0.5 mL Intramuscular Tomorrow-1000   Continuous Infusions:  PRN Meds: acetaminophen **OR** acetaminophen, bisacodyl, HYDROcodone-acetaminophen, ondansetron **OR** ondansetron (ZOFRAN) IV, traZODone  Allergies:   No Known Allergies  Social History:   Social History   Tobacco Use  . Smoking status: Former Games developer  . Smokeless tobacco: Never Used  Substance Use Topics  . Alcohol use: No    Frequency: Never  . Drug use: No     Family History:   No family history of cardiac disease.  ROS:  Review of Systems  Constitutional: Positive for fever and malaise/fatigue.  HENT: Negative.   Eyes: Negative.   Respiratory: Positive for shortness of breath.   Cardiovascular: Positive for chest pain and palpitations (when anxious). Negative for orthopnea, leg swelling and PND.  Gastrointestinal: Positive for nausea.  Negative for abdominal pain, blood in stool and melena.  Genitourinary: Negative.   Musculoskeletal: Negative.   Skin: Negative.   Neurological: Positive for dizziness and headaches.  Endo/Heme/Allergies: Negative.    Physical Exam/Data:   Vitals:   11/07/17 0417 11/07/17 0430 11/07/17 0500 11/07/17 0608  BP: (!) 93/55 96/62 96/63  (!) 92/57  Pulse: 65 65  63 64  Resp:      Temp:    98 F (36.7 C)  TempSrc:    Oral  SpO2: 97% 99%  93%  Weight:      Height:        Intake/Output Summary (Last 24 hours) at 11/07/2017 0802 Last data filed at 11/07/2017 0634 Gross per 24 hour  Intake 268.2 ml  Output 300 ml  Net -31.8 ml   Filed Weights   11/06/17 2112 11/07/17 0239  Weight: 68 kg 67.6 kg   Body mass index is 28.15 kg/m.  General:  Well nourished, well developed, in no acute distress. HEENT: Prominent parotid glands bilaterally. Lymph: no adenopathy Neck: No JVD or HJR. Endocrine:  No thryomegaly Vascular: No carotid bruits; Radial and pedal pulses are 1+ bilaterally. Cardiac:  RRR without murmurs, rubs, or gallops. Lungs:  Good air movement.  Coarse breath sounds with dry crackles in both lungs. Abd: soft, nontender, no hepatomegaly  Ext: no edema Musculoskeletal:  No deformities, BUE and BLE strength normal and equal Skin: warm and dry  Neuro:  CNs 3-12 intact, no focal abnormalities noted Psych:  Normal affect   EKG:  The EKG was personally reviewed and demonstrates:  NSR without significant abnormaltiies. Telemetry:  Telemetry was personally reviewed and demonstrates:  NSR with occasional PVC's (including pairs).  Relevant CV Studies: None.  Laboratory Data:  Chemistry Recent Labs  Lab 11/06/17 2124 11/07/17 0436  NA 138 138  K 3.6 3.4*  CL 102 104  CO2 26 25  GLUCOSE 104* 147*  BUN 14 16  CREATININE 0.91 0.94  CALCIUM 9.0 8.5*  GFRNONAA >60 >60  GFRAA >60 >60  ANIONGAP 10 9    No results for input(s): PROT, ALBUMIN, AST, ALT, ALKPHOS, BILITOT in the last 168 hours. Hematology Recent Labs  Lab 11/06/17 2124 11/07/17 0436  WBC 11.0* 10.9*  RBC 4.50 4.17  HGB 12.3 11.5*  HCT 39.2 36.6  MCV 87.1 87.8  MCH 27.3 27.6  MCHC 31.4 31.4  RDW 14.5 14.5  PLT 299 268   Cardiac Enzymes Recent Labs  Lab 11/06/17 2124  TROPONINI <0.03   No results for input(s): TROPIPOC in the last 168 hours.  BNPNo  results for input(s): BNP, PROBNP in the last 168 hours.  DDimer No results for input(s): DDIMER in the last 168 hours.  Radiology/Studies:  Ct Angio Chest Pe W Or Wo Contrast  Result Date: 11/07/2017 CLINICAL DATA:  72 year old female with concern for pulmonary embolism. EXAM: CT ANGIOGRAPHY CHEST WITH CONTRAST TECHNIQUE: Multidetector CT imaging of the chest was performed using the standard protocol during bolus administration of intravenous contrast. Multiplanar CT image reconstructions and MIPs were obtained to evaluate the vascular anatomy. CONTRAST:  75mL OMNIPAQUE IOHEXOL 350 MG/ML SOLN COMPARISON:  Chest radiograph dated 11/06/2017 and CT dated 12/11/2016 FINDINGS: Cardiovascular: There is moderate cardiomegaly, progressed since the prior CT. No pericardial effusion. There is mild atherosclerotic calcification of the thoracic aorta. There is mild dilatation of the central pulmonary arteries suggestive of underlying pulmonary hypertension. No CT evidence of pulmonary embolism. Mediastinum/Nodes: Enlarged bilateral hilar and mediastinal lymph nodes measure 15  mm in the right hilum and 18 mm on the left. Direct comparison with prior CT is limited due to noncontrast nature of the prior CT. The esophagus is grossly unremarkable. No mediastinal fluid collection. Lungs/Pleura: Diffuse interstitial coarsening consistent with known pulmonary fibrosis. Overall slight progression of the disease since the prior CT. No consolidative changes. There is no pleural effusion or pneumothorax. The central airways are patent. Upper Abdomen: No acute abnormality. Musculoskeletal: No chest wall abnormality. No acute or significant osseous findings. Review of the MIP images confirms the above findings. IMPRESSION: 1. No acute intrathoracic pathology. No CT evidence of pulmonary embolism. 2. Diffuse interstitial coarsening consistent with known pulmonary fibrosis. Overall slight progression of the disease since the prior CT.  3. Bilateral hilar and mediastinal adenopathy, likely reactive. 4. Cardiomegaly, new or progressed since the prior CT. Electronically Signed   By: Elgie Collard M.D.   On: 11/07/2017 07:00   Dg Chest Port 1 View  Result Date: 11/06/2017 CLINICAL DATA:  36-year-old female with chest pain. EXAM: PORTABLE CHEST 1 VIEW COMPARISON:  Chest CT dated 12/11/2016 FINDINGS: There is diffuse interstitial coarsening consistent with known interstitial lung disease. No focal consolidation, pleural effusion, or pneumothorax. The cardiac silhouette is within normal limits. Atherosclerotic calcification of the aortic arch. No acute osseous pathology. IMPRESSION: 1. No acute cardiopulmonary process. 2. Chronic changes of interstitial lung disease. Electronically Signed   By: Elgie Collard M.D.   On: 11/06/2017 22:28    Assessment and Plan:   Pleuritic chest pain Quality chest pain is pleuritic and could represent pleurisy or pericarditis.  Her EKG does not show any ST changes consistent with pericarditis.  Additionally friction rub is not appreciated on exam.  Nonetheless, I think it would be reasonable to try empiric treatment for this.  Symptoms are less concerning for ACS, though coronary artery calcification is noted on CTA of the chest.  Initiate naproxen 500 mg twice daily.  May benefit from concurrent colchicine, though I will defer for the time being.  Obtain transthoracic echocardiogram.  If chest pain does not improve with empiric therapy or significant structural abnormalities are noted by echocardiogram, will need to consider catheterization or stress testing as an inpatient.  Otherwise, noninvasive ischemia testing can be deferred to the outpatient setting.  Trend troponin x 3 (or until it has peaked).  Initiate ASA 81 mg daily.  Start pantoprazole 20 mg daily for GI protection in the setting of NSAID use.  Hypotension Patient currently asymptomatic.  This may have been exacerbated by IV  amiodarone administered in the emergency department.  Continue hydration with IV fluids and oral intake.  No further amiodarone or other heart rate lowering medications at this time.  PVCs Incidentally noted and likely contributing to low heart rate measured at triage in the ED.  Telemetry currently shows only occasional PVCs.  No role for amiodarone at this time.  If blood pressure tolerates, low-dose beta-blocker could be considered in the future.  Obtain transthoracic echocardiogram, as above.  Maintain potassium and magnesium levels greater than 4.0 and 2.0, respectively.  Check TSH  Mediastinal/hilar lymphadenopathy Nonspecific and possibly reactive.  This could indicate an acute inflammatory/infectious process that may also be driving aforementioned pleuritic chest pain.  However, Sjogren's syndrome is a risk factor for development of non-Hogkins lymphoma.  Further work-up and follow-up per internal medicine.  For questions or updates, please contact [facility] Please consult [email or web address] for contact info under [facility].  Signed, Yvonne Kendall, MD  11/07/2017  8:02 AM

## 2017-11-07 NOTE — Progress Notes (Addendum)
Pt is complaining of dizziness BP is at 93/61 HR 64. Page prime. Will continue to monitor.  Update 2014 Doctor Mayo ordered 0.9% sodium chloride 100 ml/hr continuous> will continue to monitor.  Update 0500: Pt states that she don't feel dizzy anymore and feeling a lot better. Will continue to monitor.

## 2017-11-07 NOTE — Plan of Care (Signed)
  Problem: Education: Goal: Knowledge of General Education information will improve Description Including pain rating scale, medication(s)/side effects and non-pharmacologic comfort measures Outcome: Progressing   Problem: Nutrition: Goal: Adequate nutrition will be maintained Outcome: Progressing   Problem: Pain Managment: Goal: General experience of comfort will improve Outcome: Progressing   

## 2017-11-07 NOTE — Plan of Care (Signed)
  Problem: Health Behavior/Discharge Planning: Goal: Ability to manage health-related needs will improve Outcome: Progressing   Problem: Pain Managment: Goal: General experience of comfort will improve Outcome: Progressing   Problem: Safety: Goal: Ability to remain free from injury will improve Outcome: Progressing   

## 2017-11-07 NOTE — Progress Notes (Addendum)
Pt complained of dizziness BP was at 90/58 HR 70. Pt amio gtt rate was change but was stopped due to low BP and symptoms of dizziness. Talked to Doctor maier and ordered to discontinue the amiodarone gtt, collect u/a, check lactic acid level, blood culture, and 0.9% sodium chloride 75 ml/hr once. Will continue to monitor.  Update 0518: Lab called in for a critical lactic acid of 2.3 Prime notified. Will continue to monitor.

## 2017-11-07 NOTE — Progress Notes (Signed)
*  PRELIMINARY RESULTS* Echocardiogram 2D Echocardiogram has been performed.  Denise [name] Acel Natzke 11/07/2017, 5:07 PM

## 2017-11-07 NOTE — Consult Note (Signed)
Pt/family refused restart at this time, informed RN Morrie Sheldon

## 2017-11-07 NOTE — Plan of Care (Signed)
  Problem: Health Behavior/Discharge Planning: Goal: Ability to manage health-related needs will improve Outcome: Progressing   Problem: Activity: Goal: Risk for activity intolerance will decrease Outcome: Progressing   Problem: Pain Managment: Goal: General experience of comfort will improve Outcome: Progressing   Problem: Safety: Goal: Ability to remain free from injury will improve Outcome: Progressing   

## 2017-11-07 NOTE — H&P (Signed)
[facility] [place] at [facility]   PATIENT NAME: Denise [name]    MR#:  [id number]  DATE OF BIRTH:  06/03/1945  DATE OF ADMISSION:  11/06/2017  PRIMARY CARE PHYSICIAN: [facility]   REQUESTING/REFERRING PHYSICIAN:   CHIEF COMPLAINT:   Chief Complaint  Patient presents with  . Chest Pain    HISTORY OF PRESENT ILLNESS: Denise [name]  is a 72 y.o. female with history of Sjogren's disease.  She is [place] from [place] and she visits for 6 months a year and [place].  Patient recalls she was told in the past that she had some blockages in her coronary arteries, but she never had any intervention done. Patient presented to emergency room for acute onset of pleuritic chest pain, started suddenly last night, while at rest.  The pain is retrosternal, constant and moderate in nature, worse with deep breathing.  No cough, no fever, no chills, no palpitations.  Patient denies having similar episodes in the past. While in the emergency room, she was noted with frequent PVCs on EKG. Patient was started on amiodarone drip, without much improvement in her PVCs.  Blood pressure is now on the low side with systolic in 90s. Blood test done emergency room, running CBC and CMP are grossly unremarkable except for slightly elevated WBC at 11,000.  Troponin is less than 0.03.  UA is pending. EKG shows sinus rhythm, rate 86 bpm, normal PR interval, normal QRS, normal QT.  Frequent PVCs noted.  No acute ischemic changes. No acute cardiopulmonary process per chest x-ray. Patient is admitted for further evaluation and treatment.  PAST MEDICAL HISTORY:   Past Medical History:  Diagnosis Date  . Thyroid disease     PAST SURGICAL HISTORY: History reviewed. No pertinent surgical history.  SOCIAL HISTORY:  Social History   Tobacco Use  . Smoking status: Former Games developer  . Smokeless tobacco: Never Used  Substance Use Topics  .  Alcohol use: No    Frequency: Never    FAMILY HISTORY: Hypertension in both parents.  DRUG ALLERGIES: No Known Allergies  REVIEW OF SYSTEMS:   CONSTITUTIONAL: No fever, fatigue or weakness.  EYES: No changes in vision.  EARS, NOSE, AND THROAT: No tinnitus or ear pain.  RESPIRATORY: No cough, shortness of breath, wheezing or hemoptysis.  CARDIOVASCULAR: Positive for pleuritic chest pain, no orthopnea, edema.  GASTROINTESTINAL: No nausea, vomiting, diarrhea or abdominal pain.  GENITOURINARY: No dysuria, hematuria.  ENDOCRINE: No polyuria, nocturia. HEMATOLOGY: No bleeding. SKIN: No rash or lesion. MUSCULOSKELETAL: No joint pain at this time.   NEUROLOGIC: No focal weakness.  PSYCHIATRY: No anxiety or depression.   MEDICATIONS AT HOME: Plaquenil, naproxen and Salagen. Prior to Admission medications   Medication Sig Start Date End Date Taking? Authorizing Provider  levofloxacin (LEVAQUIN) 500 MG tablet Take 1 tablet (500 mg total) by mouth daily. 12/11/16   Fisher, Roselyn Bering, PA-C      PHYSICAL EXAMINATION:   VITAL SIGNS: Blood pressure 121/67, pulse 74, temperature 100 F (37.8 C), temperature source Oral, resp. rate (!) 22, height 5\' 1"  (1.549 m), weight 68 kg, SpO2 95 %.  GENERAL:  72 y.o.-year-old patient lying in the bed with mild distress, secondary to chest pain.  EYES: Pupils equal, round, reactive to light and accommodation. No scleral icterus. Extraocular muscles intact.  HEENT: Head atraumatic, normocephalic. Oropharynx and nasopharynx clear.  NECK:  Supple, no jugular venous distention. No thyroid enlargement, no  tenderness.  LUNGS: Normal breath sounds bilaterally, no wheezing, rales,rhonchi or crepitation. No use of accessory muscles of respiration.  CARDIOVASCULAR: S1, S2 normal. No S3/S4.  ABDOMEN: Soft, nontender, nondistended. Bowel sounds present. No organomegaly or mass.  EXTREMITIES: No pedal edema, cyanosis, or clubbing.  NEUROLOGIC: Cranial nerves II  through XII are intact. Muscle strength 5/5 in all extremities. Sensation intact.   PSYCHIATRIC: The patient is alert and oriented x 3.  SKIN: No obvious rash, lesion, or ulcer.   LABORATORY PANEL:   CBC Recent Labs  Lab 11/06/17 2124  WBC 11.0*  HGB 12.3  HCT 39.2  PLT 299  MCV 87.1  MCH 27.3  MCHC 31.4  RDW 14.5   ------------------------------------------------------------------------------------------------------------------  Chemistries  Recent Labs  Lab 11/06/17 2124  NA 138  K 3.6  CL 102  CO2 26  GLUCOSE 104*  BUN 14  CREATININE 0.91  CALCIUM 9.0   ------------------------------------------------------------------------------------------------------------------ estimated creatinine clearance is 50.8 mL/min (by C-G formula based on SCr of 0.91 mg/dL). ------------------------------------------------------------------------------------------------------------------ No results for input(s): TSH, T4TOTAL, T3FREE, THYROIDAB in the last 72 hours.  Invalid input(s): FREET3   Coagulation profile No results for input(s): INR, PROTIME in the last 168 hours. ------------------------------------------------------------------------------------------------------------------- No results for input(s): DDIMER in the last 72 hours. -------------------------------------------------------------------------------------------------------------------  Cardiac Enzymes Recent Labs  Lab 11/06/17 2124  TROPONINI <0.03   ------------------------------------------------------------------------------------------------------------------ Invalid input(s): POCBNP  ---------------------------------------------------------------------------------------------------------------  Urinalysis    Component Value Date/Time   COLORURINE YELLOW (A) 12/11/2016 1315   APPEARANCEUR CLEAR (A) 12/11/2016 1315   LABSPEC 1.015 12/11/2016 1315   PHURINE 5.0 12/11/2016 1315   GLUCOSEU NEGATIVE  12/11/2016 1315   HGBUR NEGATIVE 12/11/2016 1315   BILIRUBINUR NEGATIVE 12/11/2016 1315   KETONESUR NEGATIVE 12/11/2016 1315   PROTEINUR NEGATIVE 12/11/2016 1315   NITRITE NEGATIVE 12/11/2016 1315   LEUKOCYTESUR NEGATIVE 12/11/2016 1315     RADIOLOGY: Dg Chest Port 1 View  Result Date: 11/06/2017 CLINICAL DATA:  39-year-old female with chest pain. EXAM: PORTABLE CHEST 1 VIEW COMPARISON:  Chest CT dated 12/11/2016 FINDINGS: There is diffuse interstitial coarsening consistent with known interstitial lung disease. No focal consolidation, pleural effusion, or pneumothorax. The cardiac silhouette is within normal limits. Atherosclerotic calcification of the aortic arch. No acute osseous pathology. IMPRESSION: 1. No acute cardiopulmonary process. 2. Chronic changes of interstitial lung disease. Electronically Signed   By: Elgie Collard M.D.   On: 11/06/2017 22:28    EKG: Orders placed or performed during the hospital encounter of 11/06/17  . EKG 12-Lead  . EKG 12-Lead  . ED EKG within 10 minutes  . ED EKG within 10 minutes    IMPRESSION AND PLAN:  1.  Pleuritic chest pain, of unclear etiology.  Further evaluate for PE, pneumonia with CTA of the chest.  Will rule out ACS.  Continue to monitor patient on telemetry and follow troponin levels; check 2D echo to further evaluate cardiac function. 2.  Sjogren's syndrome, continue management per rheumatology. 3.  Hypotension, status post amiodarone use, could be related to amiodarone drip.  Systolic blood pressure is currently at 90.  Will stop amiodarone drip and start IV fluids.  Will rule out sepsis, check lactic acid level, blood cultures and urine culture.  Continue to monitor clinically closely.  All the records are reviewed and case discussed with ED provider. Management plans discussed with the patient, family and they are in agreement.  CODE STATUS: Full    TOTAL TIME TAKING CARE OF THIS PATIENT: 50 minutes.    Marylene Land  Caryn Bee M.D  on 11/07/2017 at 12:13 AM  Between 7am to 6pm - Pager - [phone]  After 6pm go to [email or web address] - password [email or web address] [facility] [place] at [facility]  [phone]  CC: Primary care physician; [facility]

## 2017-11-07 NOTE — Progress Notes (Signed)
Admitted this morning for chest pain, patient told me that she is having cough, fever.  Lactic acid slightly high so started on antibiotic.  CT chest reviewed.  Cardiologist recommended echocardiogram.  Discussed with patient with help of Spanish and further.  Family inquired about home including thyroid.  Requested family to get her home medicines, we have to review with pharmacist and restart them. #1 pleuritic chest pain: Patient has history of pulmonary fibrosis, noted to have some lymph node, hilar lymphadenopathy which is likely reactive. #2 .PVCs: Keep potassium more than 4, magnesium more than 2.  Discontinue amiodarone.  Check echocardiogram. Time spent 15 minutes

## 2017-11-07 NOTE — ED Notes (Signed)
Report finished att, delay to call report d/t pt load

## 2017-11-08 ENCOUNTER — Telehealth: Payer: Self-pay | Admitting: Cardiovascular Disease

## 2017-11-08 DIAGNOSIS — R071 Chest pain on breathing: Secondary | ICD-10-CM

## 2017-11-08 LAB — BASIC METABOLIC PANEL
ANION GAP: 5 (ref 5–15)
BUN: 15 mg/dL (ref 8–23)
CO2: 25 mmol/L (ref 22–32)
CREATININE: 0.96 mg/dL (ref 0.44–1.00)
Calcium: 8.4 mg/dL — ABNORMAL LOW (ref 8.9–10.3)
Chloride: 107 mmol/L (ref 98–111)
GFR calc non Af Amer: 59 mL/min — ABNORMAL LOW (ref >60)
GLUCOSE: 166 mg/dL — AB (ref 70–99)
POTASSIUM: 4.7 mmol/L (ref 3.5–5.1)
Sodium: 137 mmol/L (ref 135–145)

## 2017-11-08 LAB — MAGNESIUM: MAGNESIUM: 2.3 mg/dL (ref 1.7–2.4)

## 2017-11-08 LAB — C-REACTIVE PROTEIN: CRP: 10.9 mg/dL — AB (ref <1.0)

## 2017-11-08 LAB — GLUCOSE, CAPILLARY: GLUCOSE-CAPILLARY: 84 mg/dL (ref 70–99)

## 2017-11-08 MED ORDER — AMOXICILLIN-POT CLAVULANATE 875-125 MG PO TABS: 1.0000 | ORAL_TABLET | Freq: Two times a day (BID) | ORAL | 0 refills | Status: AC

## 2017-11-08 MED ORDER — LEVOTHYROXINE SODIUM 100 MCG PO TABS: 100.0000 ug | ORAL_TABLET | Freq: Every day | ORAL | 0 refills | Status: AC

## 2017-11-08 MED ORDER — NAPROXEN 500 MG PO TABS: 500.0000 mg | ORAL_TABLET | Freq: Two times a day (BID) | ORAL | 0 refills | Status: DC

## 2017-11-08 MED ORDER — HYDROXYCHLOROQUINE SULFATE 200 MG PO TABS: 200.0000 mg | ORAL_TABLET | Freq: Every day | ORAL | 0 refills | Status: DC

## 2017-11-08 MED ORDER — AZITHROMYCIN 250 MG PO TABS: ORAL_TABLET | ORAL | 0 refills | Status: AC

## 2017-11-08 NOTE — Telephone Encounter (Signed)
-----   Message from Iran Ouch, MD sent at 11/08/2017 12:29 PM EDT ----- This patient needs a follow-up appointment in 1 to 2 weeks for chest pain. She was seen by Dr. Okey Dupre.

## 2017-11-08 NOTE — Telephone Encounter (Signed)
Unable to lmov for patient ° °Will try again at a later time ° °

## 2017-11-08 NOTE — Discharge Summary (Signed)
Denise [name], is a 72 y.o. female  DOB [birth year]  MRN [id number].  Admission date:  11/06/2017  Admitting Physician  [name], MD  Discharge Date:  11/08/2017   Primary MD  Center, [name]  Recommendations for primary care physician for things to follow:   Follow-up at [facility] in 1 to 2 weeks Follow-up with Dr. END in 4 weeks   Admission Diagnosis  Chest pain [R07.9]   Discharge Diagnosis  Chest pain [R07.9]    Active Problems:   Chest pain      Past Medical History:  Diagnosis Date  . Hypertension   . Thyroid disease     History reviewed. No pertinent surgical history.     History of present illness and  Hospital Course:     Kindly see H&P for history of present illness and admission details, please review complete Labs, Consult reports and Test reports for all details in brief  HPI  from the history and physical done on the day of admission 72 year old female patient comes in because of chest pain.  Admitted to telemetry for evaluation of acute coronary syndrome.   Hospital Course  Atypical chest pain chest pain: Troponins have been negative for 3 times.  Echocardiogram showed EF more than 60%.  Seen by cardiology from [facility].  Patient chest pain is atypical secondary to possible pericarditis, started on naproxen 500 mg p.o. twice daily, patient feels much better today, cardiologist recommended that she can be discharged home, patient can have outpatient stress test, and CT angios chest did not show PE.  Patient has history of scleroderma, CT chest showed diffuse interstitial coarsening with pulmonary fibrosis, found to have bilateral hilar lymphadenopathy about 15 mm in the right hilum, 18 mm in the left.  So started on empiric  antibiotics and discharging home with Augmentin, azithromycin.  Continue Augmentin for 5 days, azithromycin 250 mg daily for 4 days.  #2. history of scleroderma,' controlled, continue Plaquenil. 3.  Hypothyroidism: Continue Synthyroid.  Discharge Condition: Stable  Follow UP  Follow-up Information    Center, [facility]. Schedule an appointment as soon as possible for a visit on 11/17/2017.   Specialty:  General Practice Why:  Appointment Time: @ 1:00pm Contact information: [address] [place] [address] [phone]        [name], PA-C On 12/06/2017.   Specialties:  Physician Assistant, Cardiology, Radiology Why:  Appointment Time: @ 11:00 Contact information: [address] [place] [address] [phone]             Discharge Instructions  and  Discharge Medications   Used Spanish interpreter..   Allergies as of 11/08/2017   No Known Allergies     Medication List    STOP taking these medications   levofloxacin 500 MG tablet Commonly known as:  LEVAQUIN     TAKE these medications   amoxicillin-clavulanate 875-125 MG tablet Commonly known as:  AUGMENTIN Take 1 tablet by mouth 2 (two) times daily for 5 days.   azithromycin 250 MG tablet Commonly known as:  ZITHROMAX Take 2 tablets (500 mg) on  Day 1,  followed by 1 tablet (250 mg) once daily on Days 2 through 5.   hydroxychloroquine 200 MG tablet Commonly known as:  PLAQUENIL Take 1 tablet (200 mg total) by mouth daily. Start taking on:  11/09/2017   levothyroxine 100 MCG tablet Commonly known  as:  SYNTHROID, LEVOTHROID Take 1 tablet (100 mcg total) by mouth daily at 6 (six) AM. Start taking on:  11/09/2017   naproxen 500 MG tablet Commonly known as:  NAPROSYN Take 1 tablet (500 mg total) by mouth 2 (two) times daily with a meal.         Diet and Activity recommendation: See Discharge Instructions above   Consults obtained  -cardiology   Major procedures and Radiology Reports - PLEASE review detailed and final reports for all details, in brief -      Ct Angio Chest Pe W Or Wo Contrast  Result Date: 11/07/2017 CLINICAL DATA:  72 year old female with concern for pulmonary embolism. EXAM: CT ANGIOGRAPHY CHEST WITH CONTRAST TECHNIQUE: Multidetector CT imaging of the chest was performed using the standard protocol during bolus administration of intravenous contrast. Multiplanar CT image reconstructions and MIPs were obtained to evaluate the vascular anatomy. CONTRAST:  75mL OMNIPAQUE IOHEXOL 350 MG/ML SOLN COMPARISON:  Chest radiograph dated 11/06/2017 and CT dated 12/11/2016 FINDINGS: Cardiovascular: There is moderate cardiomegaly, progressed since the prior CT. No pericardial effusion. There is mild atherosclerotic calcification of the thoracic aorta. There is mild dilatation of the central pulmonary arteries suggestive of underlying pulmonary hypertension. No CT evidence of pulmonary embolism. Mediastinum/Nodes: Enlarged bilateral hilar and mediastinal lymph nodes measure 15 mm in the right hilum and 18 mm on the left. Direct comparison with prior CT is limited due to noncontrast nature of the prior CT. The esophagus is grossly unremarkable. No mediastinal fluid collection. Lungs/Pleura: Diffuse interstitial coarsening consistent with known pulmonary fibrosis. Overall slight progression of the disease since the prior CT. No consolidative changes. There is no pleural effusion or pneumothorax. The central airways are patent. Upper Abdomen: No acute abnormality. Musculoskeletal: No chest wall abnormality. No acute or significant osseous findings. Review of the MIP images confirms the above findings. IMPRESSION: 1. No acute intrathoracic pathology. No CT evidence of pulmonary embolism. 2. Diffuse interstitial coarsening consistent with known pulmonary fibrosis. Overall slight progression of the disease since the prior CT. 3.  Bilateral hilar and mediastinal adenopathy, likely reactive. 4. Cardiomegaly, new or progressed since the prior CT. Electronically Signed   By: Elgie Collard M.D.   On: 11/07/2017 07:00   Dg Chest Port 1 View  Result Date: 11/06/2017 CLINICAL DATA:  77-year-old female with chest pain. EXAM: PORTABLE CHEST 1 VIEW COMPARISON:  Chest CT dated 12/11/2016 FINDINGS: There is diffuse interstitial coarsening consistent with known interstitial lung disease. No focal consolidation, pleural effusion, or pneumothorax. The cardiac silhouette is within normal limits. Atherosclerotic calcification of the aortic arch. No acute osseous pathology. IMPRESSION: 1. No acute cardiopulmonary process. 2. Chronic changes of interstitial lung disease. Electronically Signed   By: Elgie Collard M.D.   On: 11/06/2017 22:28    Micro Results     Recent Results (from the past 240 hour(s))  CULTURE, BLOOD (ROUTINE X 2) w Reflex to ID Panel     Status: None (Preliminary result)   Collection Time: 11/07/17  4:33 AM  Result Value Ref Range Status   Specimen Description BLOOD LEFT ANTECUBITAL  Final   Special Requests   Final    BOTTLES DRAWN AEROBIC AND ANAEROBIC Blood Culture results may not be optimal due to an inadequate volume of blood received in culture bottles   Culture   Final    NO GROWTH 1 DAY Performed at [facility], [address] [place], [place] [address]    Report Status PENDING  Incomplete  CULTURE, BLOOD (ROUTINE X 2) w Reflex to ID Panel     Status: None (Preliminary result)   Collection Time: 11/07/17  4:36 AM  Result Value Ref Range Status   Specimen Description BLOOD LEFT HAND  Final   Special Requests   Final    BOTTLES DRAWN AEROBIC AND ANAEROBIC Blood Culture adequate volume   Culture   Final    NO GROWTH 1 DAY Performed at [facility], [address] [place], [place] [address]    Report Status PENDING  Incomplete       Today   Subjective:   Lorne Skeens today has no headache,no chest abdominal pain,no new weakness tingling or numbness, feels much better wants to go home today.   Objective:   Blood pressure 114/78, pulse 76, temperature 97.9 F (36.6 C), temperature source Oral, resp. rate 18, height 5\' 1"  (1.549 m), weight 72.8 kg, SpO2 91 %.   Intake/Output Summary (Last 24 hours) at 11/08/2017 1318 Last data filed at 11/08/2017 1100 Gross per 24 hour  Intake 835.84 ml  Output 100 ml  Net 735.84 ml    Exam Awake Alert, Oriented x 3, No new F.N deficits, Normal affect [place].AT,PERRAL Supple Neck,No JVD, No cervical lymphadenopathy appriciated.  Symmetrical Chest wall movement, Good air movement bilaterally, CTAB RRR,No Gallops,Rubs or new Murmurs, No Parasternal Heave +ve B.Sounds, Abd Soft, Non tender, No organomegaly appriciated, No rebound -guarding or rigidity. No Cyanosis, Clubbing or edema, No new Rash or bruise  Data Review   CBC w Diff:  Lab Results  Component Value Date   WBC 10.9 (H) 11/07/2017   HGB 11.5 (L) 11/07/2017   HGB 13.4 10/26/2012   HCT 36.6 11/07/2017   HCT 40.3 10/26/2012   PLT 268 11/07/2017   PLT 305 10/26/2012   LYMPHOPCT 27 12/11/2016   LYMPHOPCT 31.3 10/26/2012   MONOPCT 10 12/11/2016   MONOPCT 7.5 10/26/2012   EOSPCT 4 12/11/2016   EOSPCT 3.2 10/26/2012   BASOPCT 1 12/11/2016   BASOPCT 0.7 10/26/2012    CMP:  Lab Results  Component Value Date   NA 137 11/08/2017   NA 139 10/26/2012   K 4.7 11/08/2017   K 3.7 10/26/2012   CL 107 11/08/2017   CL 101 10/26/2012   CO2 25 11/08/2017   CO2 28 10/26/2012   BUN 15 11/08/2017   BUN 15 10/26/2012   CREATININE 0.96 11/08/2017   CREATININE 0.96 10/26/2012   PROT 9.1 (H) 10/26/2012   ALBUMIN 3.7 10/26/2012   BILITOT 0.4 10/26/2012   ALKPHOS 50 10/26/2012   AST 29 10/26/2012   ALT 33 10/26/2012  .   Total Time in preparing paper work, data evaluation and todays exam - 35 minutes  Katha Hamming M.D on  11/08/2017 at 1:18 PM    Note: This dictation was prepared with Dragon dictation along with smaller phrase technology. Any transcriptional errors that result from this process are unintentional.

## 2017-11-08 NOTE — Progress Notes (Signed)
Patient daughter at bedside informed this RN that patient had eaten breakfast this morning that she had brought in from home. MD notified.

## 2017-11-08 NOTE — Progress Notes (Signed)
Progress Note  Patient Name: Denise [name] Date of Encounter: 11/08/2017  Primary Cardiologist: New - [facility], Dr. Kirke Corin  Subjective   She reports current, mild epigastric and lower anterior chest pain, described as pleuritic and non-radiating chest pain; rated 2/10 and improved from yesterday. Reports associated SOB.   Emesis x2 last night with no further nausea following emesis. Patient was NPO in the event that stress test / cath needed today. Family brought in food, and she had 2 plantains and 1 coffee this AM ~8AM.  10/27 Echo as below shows normal EF 65-70% with moderately increased PASP at 45mm HG, IVC dilated and consistent with elevated CVP, calcified AV, mild RAE/LAE, and trivial pericardial effusion was noted.   Troponin negative x2.  Pending MD to see patient. *Will need to schedule her for outpatient follow-up before discharge.  Inpatient Medications    Scheduled Meds: . aspirin EC  81 mg Oral Daily  . diphenhydrAMINE  12.5 mg Intravenous Once  . docusate sodium  100 mg Oral BID  . heparin  5,000 Units Subcutaneous Q8H  . hydroxychloroquine  200 mg Oral Daily  . Influenza vac split quadrivalent PF  0.5 mL Intramuscular Tomorrow-1000  . levothyroxine  100 mcg Oral Q0600  . naproxen  500 mg Oral BID WC  . pantoprazole  20 mg Oral Daily  . pneumococcal 23 valent vaccine  0.5 mL Intramuscular Tomorrow-1000   Continuous Infusions: . azithromycin Stopped (11/07/17 1408)  . cefTRIAXone (ROCEPHIN)  IV Stopped (11/07/17 1307)   PRN Meds: acetaminophen **OR** acetaminophen, bisacodyl, HYDROcodone-acetaminophen, ondansetron **OR** ondansetron (ZOFRAN) IV, traZODone   Vital Signs    Vitals:   11/07/17 2014 11/08/17 0116 11/08/17 0448 11/08/17 0807  BP: 99/68 106/73 98/71 114/78  Pulse: 66 63 68 76  Resp:    18  Temp: 98.7 F (37.1 C)  97.9 F (36.6 C) 97.9 F (36.6 C)  TempSrc: Oral  Oral Oral  SpO2: 94%  92% 91%  Weight:   72.8 kg   Height:         Intake/Output Summary (Last 24 hours) at 11/08/2017 1057 Last data filed at 11/07/2017 2209 Gross per 24 hour  Intake 835.84 ml  Output 100 ml  Net 735.84 ml   Filed Weights   11/06/17 2112 11/07/17 0239 11/08/17 0448  Weight: 68 kg 67.6 kg 72.8 kg    Telemetry    SR, HR 60-70s - Personally Reviewed  ECG    No new tracings - Personally Reviewed  Physical Exam   GEN: No acute distress.  Accompanied by daughter Neck: No JVD Cardiac: RRR, + systolic murmur, no rubs, or gallops.  Respiratory: Coarse breath sounds, crackles appreciated bilaterally GI: Soft, obese, nontender, non-distended  MS: No edema; No deformity. Neuro:  Nonfocal  Psych: Normal affect   Labs    Chemistry Recent Labs  Lab 11/06/17 2124 11/07/17 0436 11/08/17 1019  NA 138 138 137  K 3.6 3.4* 4.7  CL 102 104 107  CO2 26 25 25   GLUCOSE 104* 147* 166*  BUN 14 16 15   CREATININE 0.91 0.94 0.96  CALCIUM 9.0 8.5* 8.4*  GFRNONAA >60 >60 59*  GFRAA >60 >60 >60  ANIONGAP 10 9 5      Hematology Recent Labs  Lab 11/06/17 2124 11/07/17 0436  WBC 11.0* 10.9*  RBC 4.50 4.17  HGB 12.3 11.5*  HCT 39.2 36.6  MCV 87.1 87.8  MCH 27.3 27.6  MCHC 31.4 31.4  RDW 14.5 14.5  PLT 299 268    Cardiac Enzymes Recent Labs  Lab 11/06/17 2124 11/07/17 2002  TROPONINI <0.03 <0.03   No results for input(s): TROPIPOC in the last 168 hours.   BNPNo results for input(s): BNP, PROBNP in the last 168 hours.   DDimer No results for input(s): DDIMER in the last 168 hours.   Radiology    Ct Angio Chest Pe W Or Wo Contrast  Result Date: 11/07/2017 CLINICAL DATA:  72 year old female with concern for pulmonary embolism. EXAM: CT ANGIOGRAPHY CHEST WITH CONTRAST TECHNIQUE: Multidetector CT imaging of the chest was performed using the standard protocol during bolus administration of intravenous contrast. Multiplanar CT image reconstructions and MIPs were obtained to evaluate the vascular anatomy. CONTRAST:   75mL OMNIPAQUE IOHEXOL 350 MG/ML SOLN COMPARISON:  Chest radiograph dated 11/06/2017 and CT dated 12/11/2016 FINDINGS: Cardiovascular: There is moderate cardiomegaly, progressed since the prior CT. No pericardial effusion. There is mild atherosclerotic calcification of the thoracic aorta. There is mild dilatation of the central pulmonary arteries suggestive of underlying pulmonary hypertension. No CT evidence of pulmonary embolism. Mediastinum/Nodes: Enlarged bilateral hilar and mediastinal lymph nodes measure 15 mm in the right hilum and 18 mm on the left. Direct comparison with prior CT is limited due to noncontrast nature of the prior CT. The esophagus is grossly unremarkable. No mediastinal fluid collection. Lungs/Pleura: Diffuse interstitial coarsening consistent with known pulmonary fibrosis. Overall slight progression of the disease since the prior CT. No consolidative changes. There is no pleural effusion or pneumothorax. The central airways are patent. Upper Abdomen: No acute abnormality. Musculoskeletal: No chest wall abnormality. No acute or significant osseous findings. Review of the MIP images confirms the above findings. IMPRESSION: 1. No acute intrathoracic pathology. No CT evidence of pulmonary embolism. 2. Diffuse interstitial coarsening consistent with known pulmonary fibrosis. Overall slight progression of the disease since the prior CT. 3. Bilateral hilar and mediastinal adenopathy, likely reactive. 4. Cardiomegaly, new or progressed since the prior CT. Electronically Signed   By: Elgie Collard M.D.   On: 11/07/2017 07:00   Dg Chest Port 1 View  Result Date: 11/06/2017 CLINICAL DATA:  82-year-old female with chest pain. EXAM: PORTABLE CHEST 1 VIEW COMPARISON:  Chest CT dated 12/11/2016 FINDINGS: There is diffuse interstitial coarsening consistent with known interstitial lung disease. No focal consolidation, pleural effusion, or pneumothorax. The cardiac silhouette is within normal limits.  Atherosclerotic calcification of the aortic arch. No acute osseous pathology. IMPRESSION: 1. No acute cardiopulmonary process. 2. Chronic changes of interstitial lung disease. Electronically Signed   By: Elgie Collard M.D.   On: 11/06/2017 22:28    Cardiac Studies   TTE  11/07/17 Left ventricle: The cavity size was normal. Wall thickness was   increased in a pattern of mild LVH. Systolic function was   vigorous. The estimated ejection fraction was in the range of 65%   to 70%. Wall motion was normal; there were no regional wall   motion abnormalities. Left ventricular diastolic function   parameters were normal. - Aortic valve: Calcified annulus. Trileaflet; mildly thickened   leaflets. - Left atrium: The atrium was mildly dilated. - Right ventricle: The cavity size was normal. Systolic function   was normal. - Right atrium: The atrium was mildly dilated. - Pulmonary arteries: Systolic pressure was moderately increased,   estimated to be 45 mm Hg. - Inferior vena cava: The vessel was dilated. The respirophasic   diameter changes were blunted (< 50%), consistent with elevated   central venous pressure. -  Pericardium, extracardiac: A trivial pericardial effusion was   identified.   Patient Profile     72 y.o. female with a hx of Sjogren's disease, who is being seen today for the evaluation of chest pain.    Assessment & Plan    Atypical, pleuritic chest pain - 2/10 pleuritic and non-radiating chest pain with associated SOB. EKG without STE and not consistent with pericarditis. No fiction rub on exam. Troponin negative x2. Suspicion low for cardiac etiology / ACS given quality of pain and story though CAC on CTA of chest.  - CP relief with initiation of 500mg  naproxen BID. No significant findings noted on echocardiogram and patient reports improvement in chest pain since yesterday. She was made NPO overnight in preparation for possible need to stress test / cath today if echo  findings concerning; however, given the above echo results, an improvement in reported CP, and that she ate this AM ~8AM, recommend discontinuing NPO status and resuming a heart healthy diet.  - Patient will need scheduled for outpatient cardiology follow-up and consideration of outpatient stress if still reporting chest pain at follow-up appointment. Started ASA 81mg  daily and PPI for GI protection and recommend discharge with these medications.  Continue subq heparin at this time for DVT prophylaxis. Further recommendations pending MD to see patient.   Hypotension - Resolved with BP currently 114/78, HR 76. No reported symptoms since discontinuing amiodarone administered in emergency department. Recommend ambulation prior to discharge and consider obtaining orthostatics to ensure no risk of fall s/p discharge as pt has reported symptoms of pre-syncope during hospitalization.   PVCs - No reported feeling of palpitations or remaining sx. No PVCs noted on limited telemetry recordings this AM. Could consider low dose  blocker in the future if becomes symptomatic / Zoll monitor. K 4.6, Mg 2.3. Consider checking TSH. Daily BMET to monitor electrolytes.  Mediastinal / hilar lymphadenopathy - Consider pulmonary etiology of CP - Per IM, pulmonology  Anemia, Mild - Hgb 11.5 - Per IM  For questions or updates, please contact [facility] Please consult [email or web address] for contact info under        Signed, Lennon Alstrom, PA-C  11/08/2017, 10:57 AM

## 2017-11-09 LAB — HIV ANTIBODY (ROUTINE TESTING W REFLEX): HIV SCREEN 4TH GENERATION: NONREACTIVE

## 2017-11-10 ENCOUNTER — Telehealth: Payer: Self-pay

## 2017-11-10 NOTE — Telephone Encounter (Signed)
EMMI Follow-up: Patient is on the Future patient list and I received a call from her daughter, Jearld Adjutant as they had just received an automated call.  I talked with Jearld Adjutant and she her mother was feeling better and no pain.  She had gotten her Rx's filled and was aware of her follow-up appointments.  I let her know there would be another automated call with a different series of questions and to let us know if she has any concerns at that time.  No needs noted for today.

## 2017-11-11 NOTE — Telephone Encounter (Signed)
Unable to lmov for patient ° °Will try again at a later time ° °

## 2017-11-12 LAB — CULTURE, BLOOD (ROUTINE X 2)
CULTURE: NO GROWTH
Culture: NO GROWTH
Special Requests: ADEQUATE

## 2017-11-17 NOTE — Telephone Encounter (Signed)
Sent Letter

## 2017-12-02 NOTE — Progress Notes (Signed)
Cardiology Office Note Date:  12/06/2017  Patient ID:  Denise [name], [place] 10/26/1945, MRN [id number] PCP:  [facility]  Cardiologist:  Dr. Okey DupreEnd, MD    Chief Complaint: Hospital follow up  History of Present Illness: Denise [name] is a 72 y.o. female with history of Sjogren's disease, pulmonary hypertension likely secondary to pulmonary fibrosis, HTN, and hypothyroidism who presents for hospital follow up after [facility] to 10/28 for atypical chest pain.  [facility] with acute onset of sharp, substernal chest pain at rest the prior night. Pain was worse with deep inspiration and when lying flat. In the ED she was reported to have had a pulse in the 30s bpm with frequent PVCs. She was started on IV amiodarone in the ED with subsequent hypotension and no improvement in PVC burden. CTA of the chest was negative for PE, but did show pulmonary fibrosis, hilar and mediastinal lymphadenopathy, and cardiomegaly as well as coronary artery calcification. She was reportedly told her heart was not beating well by a cardiologist in [place] earlier in 2019. Work up was recommended, though not performed. Troponin negative x 2. EKG without acute significant abnormalities. Echo showed an EF of 65-70%, mild LVH, no RWMA, normal LV diastolic function, mildly dilated LA, RVSF cavity size normal with normal RVSF, mildly dilated RA, PASP 45 mmHg, dilated IVC consistent with elevated CVP, trivial pericardial effusion was noted. She was felt to have atypical, pleuritic chest pain secondary to pericarditis or pleurisy. She was placed on naproxen 500 mg bid with significant improvement in her symptoms. Regarding her hilar and mediastinal lymphadenopathy, she was placed on empiric antibiotics by IM and discharged home.   Labs:  10/2017 - Mg++ 2.3, K+ 4.7, SCr 0.96, glucose 166, sed rate 65, CRP 10.9, WBC 10.9, HGB  11.5  She comes in accompanied by her daughter.  Hospital supplied medical interpreter is utilized for today's visit.  She continues to note sharp chest discomfort with coughing as well as laying flat.  Her chest discomfort is improved when sitting up.  She denies any exertional chest pain.  She does note some fatigue and her extremities with ambulation though no associated chest pain or dyspnea.  No palpitations, dizziness, presyncope, or syncope.  Overall she feels significantly improved from how she felt leading up to her hospital admission.  The patient's daughter would like a pulmonology referral for pulmonary fibrosis.  The patient will be traveling back to [place] and mid December, 2019.  She is currently symptom-free.   Past Medical History:  Diagnosis Date  . Hypertension   . Pulmonary fibrosis (HCC)   . Pulmonary hypertension (HCC)    a. TTE 10/19:  EF of 65-70%, mild LVH, no RWMA, normal LV diastolic function, mildly dilated LA, RVSF cavity size normal with normal RVSF, mildly dilated RA, PASP 45 mmHg, dilated IVC consistent with elevated CVP, trivial pericardial effusion was noted  . Sjogren's disease (HCC)   . Thyroid disease     History reviewed. No pertinent surgical history.  Current Meds  Medication Sig  . hydroxychloroquine (PLAQUENIL) 200 MG tablet Take 1 tablet (200 mg total) by mouth daily.  Marland Kitchen. levothyroxine (SYNTHROID, LEVOTHROID) 100 MCG tablet Take 1 tablet (100 mcg total) by mouth daily at 6 (six) AM.  . naproxen (NAPROSYN) 500 MG tablet Take 1 tablet (500 mg total) by mouth 2 (two) times daily with a meal.  .  pilocarpine (SALAGEN) 5 MG tablet Take 5 mg by mouth 3 (three) times daily.    Allergies:   Patient has no known allergies.   Social History:  The patient  reports that she has quit smoking. She has never used smokeless tobacco. She reports that she does not drink alcohol or use drugs.   Family History:  The patient's family history is not on  file.  ROS:   Review of Systems  Constitutional: Positive for malaise/fatigue. Negative for chills, diaphoresis, fever and weight loss.  HENT: Negative for congestion.   Eyes: Negative for discharge and redness.  Respiratory: Positive for cough and shortness of breath. Negative for hemoptysis, sputum production and wheezing.   Cardiovascular: Positive for chest pain. Negative for palpitations, orthopnea, claudication, leg swelling and PND.  Gastrointestinal: Negative for abdominal pain, blood in stool, heartburn, melena, nausea and vomiting.  Genitourinary: Negative for hematuria.  Musculoskeletal: Negative for falls and myalgias.  Skin: Negative for rash.  Neurological: Positive for weakness. Negative for dizziness, tingling, tremors, sensory change, speech change, focal weakness and loss of consciousness.  Endo/Heme/Allergies: Does not bruise/bleed easily.  Psychiatric/Behavioral: Negative for substance abuse. The patient is not nervous/anxious.   All other systems reviewed and are negative.    PHYSICAL EXAM:  VS:  BP 100/60 (BP Location: Left Arm, Patient Position: Sitting, Cuff Size: Normal)   Pulse 75   Ht 4\' 11"  (1.499 m)   Wt 148 lb 8 oz (67.4 kg)   SpO2 97%   BMI 29.99 kg/m  BMI: Body mass index is 29.99 kg/m.  Physical Exam  Constitutional: She is oriented to person, place, and time. She appears well-developed and well-nourished.  HENT:  Head: Normocephalic and atraumatic.  Eyes: Right eye exhibits no discharge. Left eye exhibits no discharge.  Neck: Normal range of motion. No JVD present.  Cardiovascular: Normal rate, regular rhythm, S1 normal, S2 normal and normal heart sounds. Exam reveals no distant heart sounds, no friction rub, no midsystolic click and no opening snap.  No murmur heard. Pulses:      Posterior tibial pulses are 2+ on the right side, and 2+ on the left side.  Pulmonary/Chest: Effort normal and breath sounds normal. No respiratory distress. She has  no decreased breath sounds. She has no wheezes. She has no rales. She exhibits no tenderness.  Diffuse coarse breath sounds bilaterally  Abdominal: Soft. She exhibits no distension. There is no tenderness.  Musculoskeletal: She exhibits no edema.  Neurological: She is alert and oriented to person, place, and time.  Skin: Skin is warm and dry. No cyanosis. Nails show no clubbing.  Psychiatric: She has a normal mood and affect. Her speech is normal and behavior is normal. Judgment and thought content normal.     EKG:  Was ordered and interpreted by me today. Shows NSR, 75 bpm, T wave inversion along leads V2, V3, V4, V5  Recent Labs: 11/07/2017: Hemoglobin 11.5; Platelets 268 11/08/2017: BUN 15; Creatinine, Ser 0.96; Magnesium 2.3; Potassium 4.7; Sodium 137  No results found for requested labs within last 8760 hours.   CrCl cannot be calculated (Patient's most recent lab result is older than the maximum 21 days allowed.).   Wt Readings from Last 3 Encounters:  12/06/17 148 lb 8 oz (67.4 kg)  11/08/17 160 lb 7.9 oz (72.8 kg)  12/11/16 166 lb (75.3 kg)     Other studies reviewed: Additional studies/records reviewed today include: summarized above  ASSESSMENT AND PLAN:  1. Chest pain with moderate  risk for cardiac etiology and abnormal EKG: Currently chest pain-free.  Symptoms are relatively atypical for angina however she does have multiple cardiac risk factors.  Because of this, we will schedule a Lexiscan Myoview to evaluate for high risk ischemia.  She is no longer taking naproxen as she was advised to stop this approximately 1 week after her discharge.  Recent echocardiogram demonstrated normal LV systolic function.  Should symptoms persist in the setting of normal stress test would recommend repeat echocardiogram.  2. Pulmonary fibrosis/pulmonary hypertension/SOB/cough: Refer to pulmonology for management of pulmonary fibrosis and pulmonary hypertension which is likely in the setting  of her underlying lung disease.  Stable.  3. Abnormal chest CT: Findings were discussed with patient and daughter in detail.  I have recommended that they follow-up with PCP for repeat imaging as indicated.  4. Hypertension: Blood pressure is well controlled not on antihypertensives currently.  5. Sjogren's disease: Possibly leading to her underlying elevated CRP and sed rate.  Managed by PCP.  6. Language barrier: Hospital supply of medical interpreter used for today's visit.  Disposition: F/u with Dr. Okey Dupre or an APP in 3 months.  Current medicines are reviewed at length with the patient today.  The patient did not have any concerns regarding medicines.  Signed, Eula Listen, PA-C 12/06/2017 11:28 AM     [facility] [place] [address] [place], [place] [address] [phone]

## 2017-12-04 ENCOUNTER — Encounter: Payer: Self-pay | Admitting: Physician Assistant

## 2017-12-06 ENCOUNTER — Ambulatory Visit (INDEPENDENT_AMBULATORY_CARE_PROVIDER_SITE_OTHER): Payer: Medicaid Other | Admitting: Physician Assistant

## 2017-12-06 ENCOUNTER — Encounter: Payer: Self-pay | Admitting: Physician Assistant

## 2017-12-06 VITALS — BP 100/60 | HR 75 | Ht 59.0 in | Wt 148.5 lb

## 2017-12-06 DIAGNOSIS — J8410 Pulmonary fibrosis, unspecified: Secondary | ICD-10-CM

## 2017-12-06 DIAGNOSIS — R0602 Shortness of breath: Secondary | ICD-10-CM

## 2017-12-06 DIAGNOSIS — R059 Cough, unspecified: Secondary | ICD-10-CM

## 2017-12-06 DIAGNOSIS — R079 Chest pain, unspecified: Secondary | ICD-10-CM

## 2017-12-06 DIAGNOSIS — M3500 Sicca syndrome, unspecified: Secondary | ICD-10-CM

## 2017-12-06 DIAGNOSIS — R9389 Abnormal findings on diagnostic imaging of other specified body structures: Secondary | ICD-10-CM

## 2017-12-06 DIAGNOSIS — I10 Essential (primary) hypertension: Secondary | ICD-10-CM

## 2017-12-06 DIAGNOSIS — I272 Pulmonary hypertension, unspecified: Secondary | ICD-10-CM

## 2017-12-06 DIAGNOSIS — R05 Cough: Secondary | ICD-10-CM

## 2017-12-06 DIAGNOSIS — Z789 Other specified health status: Secondary | ICD-10-CM

## 2017-12-06 NOTE — Patient Instructions (Signed)
Medication Instructions:  Your physician recommends that you continue on your current medications as directed. Please refer to the Current Medication list given to you today.  If you need a refill on your cardiac medications before your next appointment, please call your pharmacy.   Lab work: none If you have labs (blood work) drawn today and your tests are completely normal, you will receive your results only by: Marland Kitchen. MyChart Message (if you have MyChart) OR . A paper copy in the mail If you have any lab test that is abnormal or we need to change your treatment, we will call you to review the results.  Testing/Procedures: Your physician has requested that you have a lexiscan myoview. For further information please visit [email or web address]. Please follow instruction sheet, as given.  [facility] MYOVIEW  Your caregiver has ordered a Stress Test with nuclear imaging. The purpose of this test is to evaluate the blood supply to your heart muscle. This procedure is referred to as a "Non-Invasive Stress Test." This is because other than having an IV started in your vein, nothing is inserted or "invades" your body. Cardiac stress tests are done to find areas of poor blood flow to the heart by determining the extent of coronary artery disease (CAD). Some patients exercise on a treadmill, which naturally increases the blood flow to your heart, while others who are  unable to walk on a treadmill due to physical limitations have a pharmacologic/chemical stress agent called Lexiscan . This medicine will mimic walking on a treadmill by temporarily increasing your coronary blood flow.   Please note: these test may take anywhere between 2-4 hours to complete  PLEASE REPORT TO [facility] MEDICAL MALL ENTRANCE  THE VOLUNTEERS AT THE FIRST DESK WILL DIRECT YOU WHERE TO GO  Date of Procedure:_____________________________________  Arrival Time for Procedure:______________________________   PLEASE NOTIFY THE  OFFICE AT LEAST 24 HOURS IN ADVANCE IF YOU ARE UNABLE TO KEEP YOUR APPOINTMENT.  [phone] AND  PLEASE NOTIFY NUCLEAR MEDICINE AT [facility] AT LEAST 24 HOURS IN ADVANCE IF YOU ARE UNABLE TO KEEP YOUR APPOINTMENT. [phone]  How to prepare for your Myoview test:  1. Do not eat or drink after midnight 2. No caffeine for 24 hours prior to test 3. No smoking 24 hours prior to test. 4. Your medication may be taken with water.  If your doctor stopped a medication because of this test, do not take that medication. 5. Ladies, please do not wear dresses.  Skirts or pants are appropriate. Please wear a short sleeve shirt. 6. No perfume, cologne or lotion. 7. Wear comfortable walking shoes. No heels!   Follow-Up: You have been referred to PULMONOLOGY FOR EVALUATION.   At [facility], you and your health needs are our priority.  As part of our continuing mission to provide you with exceptional heart care, we have created designated Provider Care Teams.  These Care Teams include your primary Cardiologist (physician) and Advanced Practice Providers (APPs -  Physician Assistants and Nurse Practitioners) who all work together to provide you with the care you need, when you need it. You will need a follow up appointment in 3 months.  Please call our office 2 months in advance to schedule this appointment.  You may see DR Cristal DeerHRISTOPHER END.   Gammagrafa cardaca (Cardiac Nuclear Scanning) Se utiliza una gammagrafa cardaca para controlar si hay problemas en el corazn, por ejemplo, los siguientes:  Una regin del corazn no recibe suficiente sangre.  Parte del msculo cardaco ha  muerto, lo que ocurre cuando se produce un ataque cardaco.  La pared del corazn no funciona normalmente. Para este estudio, se inyecta un colorante radiactivo (marcador) en el torrente sanguneo. Despus de que el marcador se haya dirigido al corazn, se [place] un dispositivo de gammagrafa para medir la cantidad de  Engineer, manufacturing systems que es absorbida por diferentes zonas del corazn o que se distribuye en ellas. INFORME AL MDICO:  Cualquier alergia que tenga.  Todos los [employer], incluidos vitaminas, hierbas, gotas oftlmicas, cremas y [address] de [address].  Problemas previos que usted o los [employer] de su familia hayan tenido con el uso de anestsicos.  Enfermedades de la sangre que padezca.  Cirugas previas.  Enfermedades que tenga.  RIESGOS Y COMPLICACIONES En general, se trata de un procedimiento seguro. Sin embargo, Photographer, pueden surgir problemas. Algunos posibles problemas incluyen:  Dolor de pecho intenso.  Latidos cardacos rpidos.  Sensacin de calor en el pecho, que suele desaparecer rpidamente. ANTES DEL PROCEDIMIENTO Consulte a su mdico si debe cambiar o suspender los medicamentos que toma habitualmente. PROCEDIMIENTO Griffin Dakin, este procedimiento se realiza en un hospital y [place] 2 y Nurse, learning disability.  Se coloca una va intravenosa (IV) en una de las venas.  El mdico le inyectar una pequea cantidad de marcador radiactivo a travs de la va.  Luego, usted esperar durante 20 a mientras el marcador viaja por el torrente sanguneo.  Estar recostado sobre una camilla para que se puedan tomar imgenes del corazn. Se tomarn imgenes durante un lapso de 15 a .  Deber ejercitarse sobre una cinta caminadora o una bicicleta fija. Mientras se ejercita, se controlar la actividad del corazn con Artist (ECG) y se Management consultant presin arterial.  Si no puede ejercitarse, tal vez le administren un medicamento para acelerar los latidos cardacos.  Cuando el corazn reciba el flujo mximo de [place], se volver a Materials engineer en la va intravenosa.  Despus de entre 20 y , regresar a la camilla y se tomarn ms imgenes del Programmer, multimedia.  Cuando el procedimiento haya terminado, se retirar  la va intravenosa. DESPUS DEL PROCEDIMIENTO  Es probable que pueda retirarse inmediatamente despus del estudio. Puede retomar su rutina normal, incluidos la dieta, las actividades y [employer], a menos que el mdico le indique lo contrario.  Averige cmo y [employer].  Esta informacin no tiene Theme park manager el consejo del mdico. Asegrese de hacerle al mdico cualquier pregunta que tenga. Document Released: 04/25/2012 Document Revised: 01/03/2013 Document Reviewed: 12/07/2012 Elsevier Interactive Patient Education  2017 [employer].

## 2017-12-07 ENCOUNTER — Ambulatory Visit (INDEPENDENT_AMBULATORY_CARE_PROVIDER_SITE_OTHER): Payer: Medicaid Other | Admitting: Internal Medicine

## 2017-12-07 ENCOUNTER — Encounter: Payer: Self-pay | Admitting: Internal Medicine

## 2017-12-07 VITALS — BP 104/62 | HR 90 | Resp 16 | Ht 59.0 in | Wt 147.0 lb

## 2017-12-07 DIAGNOSIS — J849 Interstitial pulmonary disease, unspecified: Secondary | ICD-10-CM

## 2017-12-07 DIAGNOSIS — R05 Cough: Secondary | ICD-10-CM

## 2017-12-07 DIAGNOSIS — R059 Cough, unspecified: Secondary | ICD-10-CM

## 2017-12-07 MED ORDER — GUAIFENESIN-CODEINE 100-10 MG/5ML PO SOLN: 5.0000 mL | ORAL | 0 refills | Status: DC | PRN

## 2017-12-07 MED ORDER — FLUTTER DEVI: 1.0000 | Freq: Four times a day (QID) | 0 refills | Status: AC

## 2017-12-07 NOTE — Progress Notes (Signed)
Name: Denise [name] MRN: [id number] DOB: 03/24/1945     CONSULTATION DATE: 11.26.19 REFERRING MD : Leonette Most drew  CHIEF COMPLAINT: SOB  STUDIES:       10.27.19  CT chest Independently reviewed by Me extensive b/l interstitial infiltrates Findings c/w fibrotic NSIP, UIP   HISTORY OF PRESENT ILLNESS: 72 year old pleasant Hispanic female seen today for abnormal CT chest Patient was recently admitted to the hospital for chest pain She was prescribed antibiotics and prednisone and she felt better   admitted to [facility] 11/07/2017 with acute onset of sharp, substernal chest pain at rest the prior night. Pain was worse with deep inspiration and when lying flat. In the ED she was reported to have had a pulse in the 30s bpm with frequent PVCs. CTA of the chest was negative for PE, but did show pulmonary fibrosis, hilar and mediastinal lymphadenopathy, and cardiomegaly as well as coronary artery calcification.   Troponin negative x 2. EKG without acute significant abnormalities. Echo showed an EF of 65-70%, mild LVH, no RWMA, normal LV diastolic function, mildly dilated LA, RVSF cavity size normal with normal RVSF, mildly dilated RA, PASP 45 mmHg, dilated IVC consistent with elevated CVP, trivial pericardial effusion was noted.    Her chest pain is being evaluated by Dr. 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  has no past surgical history on file. Prior to Admission medications   Medication Sig Start Date End Date Taking? Authorizing Provider  hydroxychloroquine (PLAQUENIL) 200 MG tablet Take 1 tablet (200 mg total) by mouth daily. 11/09/17   Katha Hamming, MD  levothyroxine (SYNTHROID, LEVOTHROID) 100 MCG tablet Take 1 tablet (100 mcg total) by mouth daily at 6 (six) AM. 11/09/17   Katha Hamming, MD  naproxen (NAPROSYN) 500 MG tablet Take 1 tablet (500 mg total) by mouth 2 (two) times daily with a meal. 11/08/17   Katha Hamming, MD  pilocarpine (SALAGEN) 5 MG tablet Take 5 mg by mouth 3 (three) times daily.    [provider]   No Known Allergies  FAMILY HISTORY:  family history is not on file. SOCIAL HISTORY:  reports that she has quit smoking. She has never used smokeless tobacco. She reports that she does not drink alcohol or use drugs.  REVIEW OF SYSTEMS:   Constitutional: Negative for fever, chills, weight loss, malaise/fatigue and diaphoresis.  HENT: Negative for hearing loss, ear pain, nosebleeds, congestion, sore throat, neck pain, tinnitus and ear discharge.   Eyes: Negative for blurred vision, double vision, photophobia, pain, discharge and redness.  Respiratory + cough, hemoptysis, +sputum production, +shortness of breath, +wheezing and stridor.  Cardiovascular: Negative for chest pain, palpitations, orthopnea, claudication, leg swelling and PND.  Gastrointestinal: Negative for heartburn, nausea, vomiting, abdominal pain, diarrhea, constipation, blood in stool and melena.  Genitourinary: Negative for dysuria, urgency, frequency, hematuria and flank pain.  Musculoskeletal: Negative for myalgias, back pain, joint pain and falls.  Skin:  Negative for itching and rash.  Neurological: Negative for dizziness, tingling, tremors, sensory change, speech change, focal weakness, seizures, loss of consciousness, weakness and headaches.  Endo/Heme/Allergies: Negative for environmental allergies and polydipsia. Does not bruise/bleed easily.  ALL OTHER ROS ARE NEGATIVE  Ht 4\' 11"  (1.499 m)   BMI 29.99 kg/m  BP 104/62 (BP Location: Left Arm, Cuff Size: Normal)   Pulse 90   Resp 16   Ht 4\' 11"  (1.499 m)   Wt 147 lb (66.7 kg)   SpO2 93%   BMI 29.69 kg/m    Physical Examination:   GENERAL:NAD, no fevers, chills, no weakness no fatigue HEAD: Normocephalic, atraumatic.  EYES: Pupils equal, round, reactive to light. Extraocular muscles intact. No scleral icterus.  MOUTH: Moist mucosal membrane.   EAR, NOSE, THROAT: Clear without exudates. No external lesions.  NECK: Supple. No thyromegaly. No nodules. No JVD.  PULMONARY:CTA B/L no wheezes, +crackles, no rhonchi CARDIOVASCULAR: S1 and S2. Regular rate and rhythm. No murmurs, rubs, or gallops. No edema.  GASTROINTESTINAL: Soft, nontender, nondistended. No masses. Positive bowel sounds.  MUSCULOSKELETAL: No swelling, clubbing, or edema. Range of motion full in all extremities.  NEUROLOGIC: Cranial nerves II through XII are intact. No gross focal neurological deficits.  SKIN: No ulceration, lesions, rashes, or cyanosis. Skin warm and dry. Turgor intact.  PSYCHIATRIC: Mood, affect within normal limits. The patient is awake, alert and oriented x 3. Insight, judgment intact.      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  At this time her respiratory disease does not seem to impede her lifestyle or her daily activities however she does have some chronic dyspnea on exertion and shortness of breath associated with chronic early morning productive cough  At this time I would recommend obtaining overnight pulse oximetry along with 6-minute walk test to assess for exertional and nocturnal hypoxia  I have also advised flutter valve 10-15 times per day  At this time I do believe her lung disease is chronic and there is no indication for biopsy at this time as CT scan findings suggest underlying chronic interstitial lung disease.  I do not believe there is any role for steroids at this time May consider antifibrinolytic therapy in the future if symptoms progress but at this time would follow-up with cardiology to finish her cardiac work-up     Patient/Family are satisfied with Plan of action and management. All questions answered Follow up in 3 months   Denise [name] Santiago Gladavid Sunny Aguon, M.D.  [facility]  Medical Director [facility] Medical Director [facility] Cardio-Pulmonary Department

## 2017-12-07 NOTE — Patient Instructions (Addendum)
Plan for and ONO  Follow up in 6 months  Flutter valve 10-15 times per day

## 2017-12-08 ENCOUNTER — Ambulatory Visit
Admission: RE | Admit: 2017-12-08 | Discharge: 2017-12-08 | Disposition: A | Discharge status: Home / Self Care | Payer: Medicaid Other | Source: Ambulatory Visit | Attending: Physician Assistant | Admitting: Physician Assistant

## 2017-12-08 DIAGNOSIS — R079 Chest pain, unspecified: Secondary | ICD-10-CM | POA: Diagnosis not present

## 2017-12-08 LAB — NM MYOCAR MULTI W/SPECT W/WALL MOTION / EF
LVDIAVOL: 39 mL (ref 46–106)
LVSYSVOL: 14 mL
NUC STRESS TID: 1.18
Peak HR: 93 {beats}/min
Rest HR: 68 {beats}/min

## 2017-12-08 MED ORDER — TECHNETIUM TC 99M TETROFOSMIN IV KIT
32.0910 | PACK | Freq: Once | INTRAVENOUS | Status: AC | PRN
  Administered 2017-12-08: 32.091 via INTRAVENOUS

## 2017-12-08 MED ORDER — REGADENOSON 0.4 MG/5ML IV SOLN
0.4000 mg | Freq: Once | INTRAVENOUS | Status: AC
  Administered 2017-12-08: 0.4 mg via INTRAVENOUS

## 2017-12-08 MED ORDER — TECHNETIUM TC 99M TETROFOSMIN IV KIT
10.9100 | PACK | Freq: Once | INTRAVENOUS | Status: AC | PRN
  Administered 2017-12-08: 10.91 via INTRAVENOUS

## 2017-12-16 ENCOUNTER — Telehealth: Payer: Self-pay | Admitting: *Deleted

## 2017-12-16 NOTE — Telephone Encounter (Signed)
SMW needs to be rescheduled from 12/17/17 due to front office being closed for pm. We can rescheduled to 12/18 at 3 pm if pt able to come in. If not then we can try to find another date that will work but must be redone within 30 days off office visit.

## 2017-12-16 NOTE — Telephone Encounter (Signed)
Patients daughter calling stating she can't do the 12/29/17  Patient is leaving the country on 12/28/17   Please call back

## 2017-12-17 ENCOUNTER — Ambulatory Visit: Payer: Medicaid Other

## 2017-12-20 NOTE — Telephone Encounter (Signed)
Appt rescheduled. Patient's daughter states she will be leaving the country on 12/17. She will not return until April 2020.

## 2017-12-24 ENCOUNTER — Ambulatory Visit (INDEPENDENT_AMBULATORY_CARE_PROVIDER_SITE_OTHER): Payer: Medicaid Other | Admitting: *Deleted

## 2017-12-24 DIAGNOSIS — J849 Interstitial pulmonary disease, unspecified: Secondary | ICD-10-CM | POA: Diagnosis not present

## 2017-12-24 NOTE — Progress Notes (Signed)
SIX MIN WALK 12/24/2017  Medications none  Supplimental Oxygen during Test? (L/min) No  Laps 6  Partial Lap (in Meters) 0  Baseline BP (sitting) 106/64  Baseline Heartrate 79  Baseline Dyspnea (Borg Scale) 1  Baseline Fatigue (Borg Scale) 0  Baseline SPO2 97  BP (sitting) 130/70  Heartrate 101  Dyspnea (Borg Scale) 0  Fatigue (Borg Scale) 0  SPO2 74  BP (sitting) 110/80  Heartrate 88  SPO2 100  Distance Completed 204  Tech Comments: pt walked normal walking pace for 6 mins. Pt did DE-SAT/ 2 liters 02 applied.    SMW performed today.  Patient scheduled to leave the country on 12/28/17 and returning April 2020. Patient informed that if she qualified that orders could not be submitted because oxygen has be to set-up within 30 days of qualifying walk. Spoke with her son-in-law and [facility] interpreter. They both talked with patient and she decided to proceed with walk. She understood that she does qualify and the danger of not having 02 with exertion. She was informed and verbalized understanding that SMW will have to be performed to re-qualify if not set up now.

## 2018-12-05 ENCOUNTER — Other Ambulatory Visit: Payer: Self-pay | Admitting: Internal Medicine

## 2018-12-05 DIAGNOSIS — J849 Interstitial pulmonary disease, unspecified: Secondary | ICD-10-CM

## 2019-03-13 ENCOUNTER — Telehealth: Payer: Self-pay | Admitting: Internal Medicine

## 2019-03-13 NOTE — Telephone Encounter (Signed)
3 attempts to schedule fu appt from recall list.   Deleting recall.    Patient daughter says patient needs to correct papers before she can come back to the [place] from [place]

## 2019-06-14 ENCOUNTER — Encounter: Payer: Self-pay | Admitting: Internal Medicine

## 2019-06-14 ENCOUNTER — Ambulatory Visit (INDEPENDENT_AMBULATORY_CARE_PROVIDER_SITE_OTHER): Payer: Medicaid Other | Admitting: Internal Medicine

## 2019-06-14 ENCOUNTER — Other Ambulatory Visit: Payer: Self-pay

## 2019-06-14 VITALS — BP 116/72 | HR 93 | Temp 98.7°F | Ht 63.0 in | Wt 157.4 lb

## 2019-06-14 DIAGNOSIS — R0602 Shortness of breath: Secondary | ICD-10-CM | POA: Diagnosis not present

## 2019-06-14 DIAGNOSIS — R9389 Abnormal findings on diagnostic imaging of other specified body structures: Secondary | ICD-10-CM

## 2019-06-14 DIAGNOSIS — J849 Interstitial pulmonary disease, unspecified: Secondary | ICD-10-CM | POA: Diagnosis not present

## 2019-06-14 NOTE — Progress Notes (Signed)
Name: Denise [name] MRN: [id number] DOB: 1945/08/23     CONSULTATION DATE: 11.26.19 REFERRING MD : Leonette Most drew   STUDIES:       10.27.19  CT chest Independently reviewed by Me extensive b/l interstitial infiltrates Findings c/w fibrotic NSIP, UIP  10/2017 Troponin negative x 2. EKG without acute significant abnormalities. Echo showed an EF of 65-70%, mild LVH, no RWMA, normal LV diastolic function, mildly dilated LA, RVSF cavity size normal with normal RVSF, mildly dilated RA, PASP 45 mmHg, dilated IVC consistent with elevated CVP, trivial pericardial effusion was noted.    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 Had been prescribed prednisone and antibiotics which made her feel better  Admitted to [facility] October 2019 Incidental finding of pulmonary fibrosis at that time with CT of the chest   Chronic cough for many years Chronic shortness of breath progressive over the last several months  No exacerbation at this time No evidence of heart failure at this time No evidence or signs of infection at this time No respiratory distress No fevers, chills, nausea, vomiting, diarrhea No evidence of lower extremity edema No evidence hemoptysis  Patient is from [place] Migrated to [place] in 2013   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 Previous smoker 1 pack a day for 30 years Quit 2002   CT chest findings relayed to the patient and family via interpreter Patient does not seem to be in any distress at this time Patient has crackles in both bases  Patient had questions are whether her lungs are getting worse or not I advised her that she needs to be assessed for exertional and nocturnal hypoxia Patient states she can walk 1-2 blocks  before she gets short of breath At this time that may be  acceptable exercise tolerance for her age and for her CT scan lung findings    PAST MEDICAL HISTORY :   has a past medical history of Hypertension, Pulmonary fibrosis (HCC), Pulmonary hypertension (HCC), Sjogren's disease (HCC), and Thyroid disease.  has no past surgical history on file. Prior to Admission medications   Medication Sig Start Date End Date Taking? Authorizing Provider  hydroxychloroquine (PLAQUENIL) 200 MG tablet Take 1 tablet (200 mg total) by mouth daily. 11/09/17   Katha Hamming, MD  levothyroxine (SYNTHROID, LEVOTHROID) 100 MCG tablet Take 1 tablet (100 mcg total) by mouth daily at 6 (six) AM. 11/09/17   Katha Hamming, MD  naproxen (NAPROSYN) 500 MG tablet Take 1 tablet (500 mg total) by mouth 2 (two) times daily with a meal. 11/08/17   Katha Hamming, MD  pilocarpine (SALAGEN) 5 MG tablet Take 5 mg by mouth 3 (three) times daily.    [provider]   No Known Allergies  SOCIAL HISTORY:  reports that she has quit smoking. She has never used smokeless tobacco. She reports that she does not drink alcohol or use drugs.  Review of Systems:  Gen:  Denies  fever, sweats, chills weight loss  HEENT: Denies blurred vision, double vision, ear pain, eye pain, hearing loss, nose bleeds, sore throat Cardiac:  No dizziness, chest pain or heaviness, chest tightness,edema, No JVD Resp:   No cough, -sputum production, -shortness of breath,-wheezing, -hemoptysis,  Gi: Denies swallowing difficulty, stomach pain, nausea or vomiting, diarrhea, constipation, bowel incontinence  Gu:  Denies bladder incontinence, burning urine Ext:   Denies Joint pain, stiffness or swelling Skin: Denies  skin rash, easy bruising or bleeding or hives Endoc:  Denies polyuria, polydipsia , polyphagia or weight change Psych:   Denies depression, insomnia or hallucinations  Other:  All other systems negative   ALL OTHER ROS  ARE NEGATIVE   BP 116/72 (BP Location: Left Arm, Cuff Size: Normal)   Pulse 93   Temp 98.7 F (37.1 C) (Oral)   Ht 5\' 3"  (1.6 m)   Wt 157 lb 6.4 oz (71.4 kg)   SpO2 97%   BMI 27.88 kg/m    Physical Examination:   General Appearance: No distress  Neuro:without focal findings,  speech normal,  HEENT: PERRLA, EOM intact.   Pulmonary: normal breath sounds, +crackels at bases CardiovascularNormal S1,S2.  No m/r/g.   Abdomen: Benign, Soft, non-tender. Renal:  No costovertebral tenderness  GU:  Not performed at this time. Endoc: No evident thyromegaly Skin:   warm, no rashes, no ecchymosis  Extremities: normal, no cyanosis, clubbing. PSYCHIATRIC: Mood, affect within normal limits.   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   Reviewed the underlying CT scan with the patient and family via interpreter At this time her respiratory disease with interstitial lung disease does not seem to overly impede her lifestyle or her daily activities at this time Patient does have chronic shortness of breath and dyspnea exertion I advised patient that we will need to assess for exertional and nocturnal hypoxia by obtaining a 6-minute walk test and overnight pulse oximetry  This time I do believe her lung disease is chronic in nature and there is no indication for biopsy at this time No indication for steroids or antibiotics at this time Repeat imaging studies may or may not be beneficial at this time however will based on the results of the exertional and nocturnal hypoxia testing  May  consider antifibrinolytic therapy in the future if symptoms progress     COVID-19 EDUCATION: The signs and symptoms of COVID-19 were discussed with the patient and how to seek care for testing (follow up with PCP or arrange E-visit).  The importance of social distancing was discussed today.  MEDICATION ADJUSTMENTS/LABS AND TESTS ORDERED: Check 6-minute walk test Check overnight pulse oximetry CURRENT MEDICATIONS REVIEWED AT LENGTH WITH PATIENT TODAY   Patient/Family are satisfied with Plan of action and management. All questions answered  Follow up 6 months  Time spent 34 minutes  Corrin Parker, M.D.  [facility]  Medical Director [place] Director [facility]io-Pulmonary Department

## 2019-06-14 NOTE — Patient Instructions (Signed)
Check 6-minute walk test Check overnight pulse oximetry Avoid secondhand smoke exposure

## 2019-06-26 ENCOUNTER — Other Ambulatory Visit: Payer: Self-pay

## 2019-06-26 ENCOUNTER — Ambulatory Visit (INDEPENDENT_AMBULATORY_CARE_PROVIDER_SITE_OTHER): Payer: Medicaid Other

## 2019-06-26 DIAGNOSIS — J9611 Chronic respiratory failure with hypoxia: Secondary | ICD-10-CM

## 2019-06-26 DIAGNOSIS — R0602 Shortness of breath: Secondary | ICD-10-CM

## 2019-06-26 NOTE — Progress Notes (Signed)
Six Minute Walk - 06/26/19 1646      Six Minute Walk   Medications taken before test (dose and time) mucinex taken at 11:00 am    Supplemental oxygen during test? No    Lap distance in meters  34 meters    Laps Completed 10    Partial lap (in meters) 9 meters    Baseline BP (sitting) 118/70    Baseline Heartrate 83    Baseline Dyspnea (Borg Scale) 0    Baseline Fatigue (Borg Scale) 0    Baseline SPO2 96 %      End of Test Values    BP (sitting) 124/74    Heartrate 100    Dyspnea (Borg Scale) 0.5    Fatigue (Borg Scale) 0.5    SPO2 79 %      2 Minutes Post Walk Values   BP (sitting) 118/70    Heartrate 82    SPO2 100 %   2lpm continuous o2   Stopped or paused before six minutes? No      Interpretation   Distance completed 349 meters    Tech Comments: after desat to 79%ra pt placed on 2lpm continuous o2 and her sats increased to 100%---walked 1 lap at 176 ft and sats at end 98%2lpm//lmr

## 2019-07-05 ENCOUNTER — Other Ambulatory Visit: Payer: Self-pay

## 2019-07-05 ENCOUNTER — Emergency Department
Admission: EM | Admit: 2019-07-05 | Discharge: 2019-07-05 | Disposition: A | Discharge status: Home / Self Care | Payer: Medicaid Other | Attending: Emergency Medicine | Admitting: Emergency Medicine

## 2019-07-05 ENCOUNTER — Emergency Department: Payer: Medicaid Other

## 2019-07-05 DIAGNOSIS — E079 Disorder of thyroid, unspecified: Secondary | ICD-10-CM | POA: Diagnosis not present

## 2019-07-05 DIAGNOSIS — J40 Bronchitis, not specified as acute or chronic: Secondary | ICD-10-CM | POA: Insufficient documentation

## 2019-07-05 DIAGNOSIS — I10 Essential (primary) hypertension: Secondary | ICD-10-CM | POA: Insufficient documentation

## 2019-07-05 DIAGNOSIS — J8410 Pulmonary fibrosis, unspecified: Secondary | ICD-10-CM | POA: Diagnosis not present

## 2019-07-05 DIAGNOSIS — Z87891 Personal history of nicotine dependence: Secondary | ICD-10-CM | POA: Insufficient documentation

## 2019-07-05 DIAGNOSIS — Z20822 Contact with and (suspected) exposure to covid-19: Secondary | ICD-10-CM | POA: Insufficient documentation

## 2019-07-05 DIAGNOSIS — R05 Cough: Secondary | ICD-10-CM | POA: Diagnosis present

## 2019-07-05 DIAGNOSIS — R0981 Nasal congestion: Secondary | ICD-10-CM | POA: Insufficient documentation

## 2019-07-05 DIAGNOSIS — Z79899 Other long term (current) drug therapy: Secondary | ICD-10-CM | POA: Diagnosis not present

## 2019-07-05 LAB — COMPREHENSIVE METABOLIC PANEL
ALT: 17 U/L (ref 0–44)
AST: 31 U/L (ref 15–41)
Albumin: 4 g/dL (ref 3.5–5.0)
Alkaline Phosphatase: 50 U/L (ref 38–126)
Anion gap: 10 (ref 5–15)
BUN: 8 mg/dL (ref 8–23)
CO2: 28 mmol/L (ref 22–32)
Calcium: 9 mg/dL (ref 8.9–10.3)
Chloride: 98 mmol/L (ref 98–111)
Creatinine, Ser: 0.76 mg/dL (ref 0.44–1.00)
GFR calc Af Amer: >60 mL/min (ref >60)
GFR calc non Af Amer: >60 mL/min (ref >60)
Glucose, Bld: 122 mg/dL — ABNORMAL HIGH (ref 70–99)
Potassium: 4.7 mmol/L (ref 3.5–5.1)
Sodium: 136 mmol/L (ref 135–145)
Total Bilirubin: 0.7 mg/dL (ref 0.3–1.2)
Total Protein: 8.9 g/dL — ABNORMAL HIGH (ref 6.5–8.1)

## 2019-07-05 LAB — CBC WITH DIFFERENTIAL/PLATELET
Abs Immature Granulocytes: 0.05 10*3/uL (ref 0.00–0.07)
Basophils Absolute: 0.1 10*3/uL (ref 0.0–0.1)
Basophils Relative: 1 %
Eosinophils Absolute: 0.6 10*3/uL — ABNORMAL HIGH (ref 0.0–0.5)
Eosinophils Relative: 6 %
HCT: 41.4 % (ref 36.0–46.0)
Hemoglobin: 13.5 g/dL (ref 12.0–15.0)
Immature Granulocytes: 1 %
Lymphocytes Relative: 28 %
Lymphs Abs: 2.9 10*3/uL (ref 0.7–4.0)
MCH: 29 pg (ref 26.0–34.0)
MCHC: 32.6 g/dL (ref 30.0–36.0)
MCV: 88.8 fL (ref 80.0–100.0)
Monocytes Absolute: 0.9 10*3/uL (ref 0.1–1.0)
Monocytes Relative: 9 %
Neutro Abs: 5.8 10*3/uL (ref 1.7–7.7)
Neutrophils Relative %: 55 %
Platelets: 309 10*3/uL (ref 150–400)
RBC: 4.66 MIL/uL (ref 3.87–5.11)
RDW: 14.2 % (ref 11.5–15.5)
WBC: 10.4 10*3/uL (ref 4.0–10.5)
nRBC: 0 % (ref 0.0–0.2)

## 2019-07-05 LAB — SARS CORONAVIRUS 2 BY RT PCR (HOSPITAL ORDER, PERFORMED IN ~~LOC~~ HOSPITAL LAB): SARS Coronavirus 2: NEGATIVE

## 2019-07-05 LAB — TROPONIN I (HIGH SENSITIVITY): Troponin I (High Sensitivity): 5 ng/L (ref <18)

## 2019-07-05 MED ORDER — PREDNISONE 50 MG PO TABS
ORAL_TABLET | ORAL | 0 refills | Status: DC
Start: 2019-07-05 — End: 2020-09-02

## 2019-07-05 MED ORDER — AMOXICILLIN-POT CLAVULANATE 875-125 MG PO TABS: 1.0000 | ORAL_TABLET | Freq: Two times a day (BID) | ORAL | 0 refills | Status: AC

## 2019-07-05 NOTE — ED Triage Notes (Signed)
Pt states that she has been coughing a lot, itchiness of the throat and chest pain that started a week ago. Pt has productive cough.

## 2019-07-05 NOTE — Discharge Instructions (Addendum)
Take Augmentin twice daily for the next week. Take prednisone once daily for the next week.

## 2019-07-05 NOTE — ED Provider Notes (Signed)
Emergency Department Provider Note  ____________________________________________  Time seen: Approximately 5:16 PM  I have reviewed the triage vital signs and the nursing notes.   HISTORY  Chief Complaint Cough   Historian Patient     HPI Denise Pontarelli Annabeth [name] is a 74 y.o. female with a history of hypertension, pulmonary fibrosis and pulmonary hypertension, presents to the emergency department with productive cough for the past week.  Patient states that she also had some chest wall pain with cough.  Patient has had some nasal congestion.  Patient's daughter denies increased work of breathing at home.  No hemoptysis or diarrhea.  No sick contacts in the home.  No fever or chills at home.  No other alleviating measures have been attempted.   Past Medical History:  Diagnosis Date  . Hypertension   . Pulmonary fibrosis ([place])   . Pulmonary hypertension ([place])    a. TTE 10/19:  EF of 65-70%, mild LVH, no RWMA, normal LV diastolic function, mildly dilated LA, RVSF cavity size normal with normal RVSF, mildly dilated RA, PASP 45 mmHg, dilated IVC consistent with elevated CVP, trivial pericardial effusion was noted  . Sjogren's disease ([place])   . Thyroid disease      Immunizations up to date:  Yes.     Past Medical History:  Diagnosis Date  . Hypertension   . Pulmonary fibrosis ([place])   . Pulmonary hypertension ([place])    a. TTE 10/19:  EF of 65-70%, mild LVH, no RWMA, normal LV diastolic function, mildly dilated LA, RVSF cavity size normal with normal RVSF, mildly dilated RA, PASP 45 mmHg, dilated IVC consistent with elevated CVP, trivial pericardial effusion was noted  . Sjogren's disease ([place])   . Thyroid disease     Patient Active Problem List   Diagnosis Date Noted  . Chest pain 11/07/2017    No past surgical history on file.  Prior to Admission medications   Medication Sig Start Date End Date Taking? Authorizing Provider  amoxicillin-clavulanate (AUGMENTIN)  875-125 MG tablet Take 1 tablet by mouth 2 (two) times daily for 10 days. 07/05/19 07/15/19  Lannie Fields, PA-C  guaiFENesin-codeine 100-10 MG/5ML syrup Take 5 mLs by mouth every 4 (four) hours as needed for cough. 12/07/17   Flora Lipps, MD  hydroxychloroquine (PLAQUENIL) 200 MG tablet Take 1 tablet (200 mg total) by mouth daily. 11/09/17   Epifanio Lesches, MD  levothyroxine (SYNTHROID, LEVOTHROID) 100 MCG tablet Take 1 tablet (100 mcg total) by mouth daily at 6 (six) AM. 11/09/17   Epifanio Lesches, MD  naproxen (NAPROSYN) 500 MG tablet Take 1 tablet (500 mg total) by mouth 2 (two) times daily with a meal. 11/08/17   Epifanio Lesches, MD  pilocarpine (SALAGEN) 5 MG tablet Take 5 mg by mouth 3 (three) times daily.    [provider]  predniSONE (DELTASONE) 50 MG tablet Take one tablet once daily for the next five days. 07/05/19   Lannie Fields, PA-C  Respiratory Therapy Supplies (FLUTTER) DEVI 1 each by Does not apply route 4 (four) times daily. 12/07/17   Flora Lipps, MD    Allergies Patient has no known allergies.  No family history on file.  Social History Social History   Tobacco Use  . Smoking status: Former Research scientist (life sciences)  . Smokeless tobacco: Never Used  Vaping Use  . Vaping Use: Never used  Substance Use Topics  . Alcohol use: No  . Drug use: No     Review of Systems  Constitutional:  No fever/chills Eyes:  No discharge ENT: No upper respiratory complaints. Respiratory: Patient has cough. No SOB/ use of accessory muscles to breath Cardiovascular: Patient has pleuritic chest pain.  Gastrointestinal:   No nausea, no vomiting.  No diarrhea.  No constipation. Musculoskeletal: Negative for musculoskeletal pain. Skin: Negative for rash, abrasions, lacerations, ecchymosis.    ____________________________________________   PHYSICAL EXAM:  VITAL SIGNS: ED Triage Vitals  Enc Vitals Group     BP 07/05/19 1607 116/73     Pulse Rate 07/05/19 1607 83      Resp 07/05/19 1607 18     Temp 07/05/19 1607 98.6 F (37 C)     Temp Source 07/05/19 1607 Oral     SpO2 07/05/19 1607 96 %     Weight 07/05/19 1608 157 lb (71.2 kg)     Height 07/05/19 1608 5\' 3"  (1.6 m)     Head Circumference --      Peak Flow --      Pain Score 07/05/19 1613 10     Pain Loc --      Pain Edu? --      Excl. in GC? --      Constitutional: Alert and oriented. Well appearing and in no acute distress. Eyes: Conjunctivae are normal. PERRL. EOMI. Head: Atraumatic. ENT:      Nose: No congestion/rhinnorhea.      Mouth/Throat: Mucous membranes are moist.  Neck: No stridor.  No cervical spine tenderness to palpation. Cardiovascular: Normal rate, regular rhythm. Normal S1 and S2.  Good peripheral circulation. Respiratory: Normal respiratory effort without tachypnea or retractions. Lungs CTAB. Good air entry to the bases with no decreased or absent breath sounds Gastrointestinal: Bowel sounds x 4 quadrants. Soft and nontender to palpation. No guarding or rigidity. No distention. Musculoskeletal: Full range of motion to all extremities. No obvious deformities noted Neurologic:  Normal for age. No gross focal neurologic deficits are appreciated.  Skin:  Skin is warm, dry and intact. No rash noted. Psychiatric: Mood and affect are normal for age. Speech and behavior are normal.   ____________________________________________   LABS (all labs ordered are listed, but only abnormal results are displayed)  Labs Reviewed  CBC WITH DIFFERENTIAL/PLATELET - Abnormal; Notable for the following components:      Result Value   Eosinophils Absolute 0.6 (*)    All other components within normal limits  COMPREHENSIVE METABOLIC PANEL - Abnormal; Notable for the following components:   Glucose, Bld 122 (*)    Total Protein 8.9 (*)    All other components within normal limits  SARS CORONAVIRUS 2 BY RT PCR (HOSPITAL ORDER, PERFORMED IN [facility])  TROPONIN I (HIGH  SENSITIVITY)  TROPONIN I (HIGH SENSITIVITY)   ____________________________________________  EKG   ____________________________________________  RADIOLOGY 07/07/19, personally viewed and evaluated these images (plain radiographs) as part of my medical decision making, as well as reviewing the written report by the radiologist.    DG Chest 1 View  Result Date: 07/05/2019 CLINICAL DATA:  Cough. EXAM: CHEST  1 VIEW COMPARISON:  November 06, 2017 FINDINGS: Stable moderate severity chronic appearing increased interstitial lung markings are seen bilaterally, most notably within the bilateral lung bases. There is no evidence of a pleural effusion or pneumothorax. The heart size and mediastinal contours are within normal limits. There is moderate severity calcification of the aortic arch. The visualized skeletal structures are unremarkable. IMPRESSION: 1. Stable moderate severity chronic appearing increased interstitial lung markings without evidence of  acute or active cardiopulmonary disease. Electronically Signed   By: Aram Candela M.D.   On: 07/05/2019 18:11    ____________________________________________    PROCEDURES  Procedure(s) performed:     Procedures     Medications - No data to display   ____________________________________________   INITIAL IMPRESSION / ASSESSMENT AND PLAN / ED COURSE  Pertinent labs & imaging results that were available during my care of the patient were reviewed by me and considered in my medical decision making (see chart for details).  Clinical Course as of Jul 04 2104  Wed Jul 05, 2019  1631 Temp: 98.6 F (37 C) [JC]    Clinical Course User Index [JC] Emelia Salisbury, Student-PA     Assessment and Plan:  Bronchitis 74 year old female presents to the emergency department with productive cough for the past week.  Vital signs are reassuring at triage.  On physical exam, patient had no increased work of breathing.  Faint  expiratory wheezes were auscultated on exam.  CBC and CMP were reassuring without leukocytosis or electrolyte abnormality.  Troponin was within reference range.  EKG revealed normal sinus rhythm without ST segment elevation or apparent arrhythmia.  COVID-19 testing was negative.  Patient was discharged with Augmentin and prednisone.  Return precautions were given to return with new or worsening symptoms. ____________________________________________  FINAL CLINICAL IMPRESSION(S) / ED DIAGNOSES  Final diagnoses:  Bronchitis      NEW MEDICATIONS STARTED DURING THIS VISIT:  ED Discharge Orders         Ordered    amoxicillin-clavulanate (AUGMENTIN) 875-125 MG tablet  2 times daily     Discontinue  Reprint     07/05/19 2100    predniSONE (DELTASONE) 50 MG tablet     Discontinue  Reprint     07/05/19 2100              This chart was dictated using voice recognition software/Dragon. Despite best efforts to proofread, errors can occur which can change the meaning. Any change was purely unintentional.     Gasper Lloyd 07/05/19 2221    Sharman Cheek, MD 07/05/19 2244

## 2019-07-05 NOTE — ED Notes (Signed)
See triage note  Presents with cough for several days  States she was feeling bad about 1-2 weeks ago    Afebrile on arrival   States recently has COVID vaccine

## 2019-07-07 ENCOUNTER — Telehealth: Payer: Self-pay

## 2019-07-07 DIAGNOSIS — J849 Interstitial pulmonary disease, unspecified: Secondary | ICD-10-CM

## 2019-07-07 NOTE — Telephone Encounter (Signed)
ONO reviewed by Dr. Belia Heman- recommend 2L O2.  Lm for pt's daughter, Jearld Adjutant [facility]) to relay results.

## 2019-07-10 NOTE — Telephone Encounter (Signed)
Lm x2 for pt's daughter, Jearld Adjutant [facility]).

## 2019-07-10 NOTE — Telephone Encounter (Signed)
spoke to pt's daughter, Jearld Adjutant [facility]) and relayed below results.  Order has been placed for night time oxygen per Marina's request.  Nothing further is needed.

## 2020-08-31 ENCOUNTER — Other Ambulatory Visit: Payer: Self-pay

## 2020-08-31 ENCOUNTER — Emergency Department: Payer: Medicaid Other

## 2020-08-31 ENCOUNTER — Inpatient Hospital Stay
Admission: EM | Admit: 2020-08-31 | Discharge: 2020-09-02 | DRG: 196 | Disposition: A | Discharge status: Home / Self Care | Payer: Medicaid Other | Attending: Internal Medicine | Admitting: Internal Medicine

## 2020-08-31 DIAGNOSIS — E039 Hypothyroidism, unspecified: Secondary | ICD-10-CM | POA: Diagnosis present

## 2020-08-31 DIAGNOSIS — J9621 Acute and chronic respiratory failure with hypoxia: Secondary | ICD-10-CM | POA: Diagnosis not present

## 2020-08-31 DIAGNOSIS — Z79890 Hormone replacement therapy (postmenopausal): Secondary | ICD-10-CM

## 2020-08-31 DIAGNOSIS — Z20822 Contact with and (suspected) exposure to covid-19: Secondary | ICD-10-CM | POA: Diagnosis present

## 2020-08-31 DIAGNOSIS — Z7952 Long term (current) use of systemic steroids: Secondary | ICD-10-CM

## 2020-08-31 DIAGNOSIS — M3500 Sicca syndrome, unspecified: Secondary | ICD-10-CM | POA: Diagnosis present

## 2020-08-31 DIAGNOSIS — Z79899 Other long term (current) drug therapy: Secondary | ICD-10-CM

## 2020-08-31 DIAGNOSIS — M349 Systemic sclerosis, unspecified: Secondary | ICD-10-CM | POA: Diagnosis present

## 2020-08-31 DIAGNOSIS — Z87891 Personal history of nicotine dependence: Secondary | ICD-10-CM

## 2020-08-31 DIAGNOSIS — J9600 Acute respiratory failure, unspecified whether with hypoxia or hypercapnia: Secondary | ICD-10-CM | POA: Diagnosis present

## 2020-08-31 DIAGNOSIS — R0602 Shortness of breath: Secondary | ICD-10-CM

## 2020-08-31 DIAGNOSIS — I2721 Secondary pulmonary arterial hypertension: Secondary | ICD-10-CM | POA: Diagnosis present

## 2020-08-31 DIAGNOSIS — I10 Essential (primary) hypertension: Secondary | ICD-10-CM | POA: Diagnosis present

## 2020-08-31 DIAGNOSIS — J9601 Acute respiratory failure with hypoxia: Secondary | ICD-10-CM | POA: Diagnosis present

## 2020-08-31 DIAGNOSIS — J84112 Idiopathic pulmonary fibrosis: Principal | ICD-10-CM | POA: Diagnosis present

## 2020-08-31 DIAGNOSIS — J8410 Pulmonary fibrosis, unspecified: Secondary | ICD-10-CM | POA: Diagnosis present

## 2020-08-31 LAB — BASIC METABOLIC PANEL
Anion gap: 8 (ref 5–15)
BUN: 22 mg/dL (ref 8–23)
CO2: 25 mmol/L (ref 22–32)
Calcium: 9.1 mg/dL (ref 8.9–10.3)
Chloride: 104 mmol/L (ref 98–111)
Creatinine, Ser: 1.1 mg/dL — ABNORMAL HIGH (ref 0.44–1.00)
GFR, Estimated: 53 mL/min — ABNORMAL LOW (ref >60)
Glucose, Bld: 124 mg/dL — ABNORMAL HIGH (ref 70–99)
Potassium: 4.1 mmol/L (ref 3.5–5.1)
Sodium: 137 mmol/L (ref 135–145)

## 2020-08-31 LAB — CBC
HCT: 40.5 % (ref 36.0–46.0)
Hemoglobin: 13.3 g/dL (ref 12.0–15.0)
MCH: 29.4 pg (ref 26.0–34.0)
MCHC: 32.8 g/dL (ref 30.0–36.0)
MCV: 89.4 fL (ref 80.0–100.0)
Platelets: 308 10*3/uL (ref 150–400)
RBC: 4.53 MIL/uL (ref 3.87–5.11)
RDW: 15.3 % (ref 11.5–15.5)
WBC: 13.2 10*3/uL — ABNORMAL HIGH (ref 4.0–10.5)
nRBC: 0 % (ref 0.0–0.2)

## 2020-08-31 LAB — TROPONIN I (HIGH SENSITIVITY)
Troponin I (High Sensitivity): 24 ng/L — ABNORMAL HIGH (ref <18)
Troponin I (High Sensitivity): 25 ng/L — ABNORMAL HIGH (ref <18)

## 2020-08-31 LAB — PROCALCITONIN: Procalcitonin: <0.1 ng/mL

## 2020-08-31 MED ORDER — LEVOTHYROXINE SODIUM 100 MCG PO TABS
100.0000 ug | ORAL_TABLET | Freq: Every day | ORAL | Status: DC
  Administered 2020-09-01 – 2020-09-02 (×2): 100 ug via ORAL
  Filled 2020-08-31 (×2): qty 1

## 2020-08-31 MED ORDER — ACETAMINOPHEN 325 MG PO TABS
650.0000 mg | ORAL_TABLET | Freq: Four times a day (QID) | ORAL | Status: DC | PRN
  Administered 2020-09-01: 650 mg via ORAL
  Filled 2020-08-31: qty 2

## 2020-08-31 MED ORDER — ACETAMINOPHEN 650 MG RE SUPP: 650.0000 mg | Freq: Four times a day (QID) | RECTAL | Status: DC | PRN

## 2020-08-31 MED ORDER — ALBUTEROL SULFATE (2.5 MG/3ML) 0.083% IN NEBU: 2.5000 mg | INHALATION_SOLUTION | RESPIRATORY_TRACT | Status: DC | PRN

## 2020-08-31 MED ORDER — IOHEXOL 350 MG/ML SOLN
75.0000 mL | Freq: Once | INTRAVENOUS | Status: AC | PRN
  Administered 2020-08-31: 75 mL via INTRAVENOUS

## 2020-08-31 MED ORDER — ONDANSETRON HCL 4 MG PO TABS: 4.0000 mg | ORAL_TABLET | Freq: Four times a day (QID) | ORAL | Status: DC | PRN

## 2020-08-31 MED ORDER — METHYLPREDNISOLONE SODIUM SUCC 40 MG IJ SOLR
40.0000 mg | Freq: Two times a day (BID) | INTRAMUSCULAR | Status: DC
  Administered 2020-09-01: 40 mg via INTRAVENOUS
  Filled 2020-08-31: qty 1

## 2020-08-31 MED ORDER — ENOXAPARIN SODIUM 40 MG/0.4ML IJ SOSY
40.0000 mg | PREFILLED_SYRINGE | INTRAMUSCULAR | Status: DC
  Administered 2020-09-01: 40 mg via SUBCUTANEOUS
  Filled 2020-08-31: qty 0.4

## 2020-08-31 MED ORDER — GUAIFENESIN ER 600 MG PO TB12
600.0000 mg | ORAL_TABLET | Freq: Two times a day (BID) | ORAL | Status: DC
  Administered 2020-09-01 – 2020-09-02 (×3): 600 mg via ORAL
  Filled 2020-08-31 (×3): qty 1

## 2020-08-31 MED ORDER — SODIUM CHLORIDE 0.9% FLUSH
3.0000 mL | Freq: Two times a day (BID) | INTRAVENOUS | Status: DC
  Administered 2020-09-01 – 2020-09-02 (×3): 3 mL via INTRAVENOUS

## 2020-08-31 MED ORDER — IPRATROPIUM-ALBUTEROL 0.5-2.5 (3) MG/3ML IN SOLN
RESPIRATORY_TRACT | Status: AC
  Filled 2020-08-31: qty 9

## 2020-08-31 MED ORDER — SENNOSIDES-DOCUSATE SODIUM 8.6-50 MG PO TABS: 1.0000 | ORAL_TABLET | Freq: Every evening | ORAL | Status: DC | PRN

## 2020-08-31 MED ORDER — IPRATROPIUM-ALBUTEROL 0.5-2.5 (3) MG/3ML IN SOLN
3.0000 mL | Freq: Four times a day (QID) | RESPIRATORY_TRACT | Status: DC
  Administered 2020-09-01: 3 mL via RESPIRATORY_TRACT
  Filled 2020-08-31: qty 3

## 2020-08-31 MED ORDER — ONDANSETRON HCL 4 MG/2ML IJ SOLN: 4.0000 mg | Freq: Four times a day (QID) | INTRAMUSCULAR | Status: DC | PRN

## 2020-08-31 MED ORDER — METHYLPREDNISOLONE SODIUM SUCC 125 MG IJ SOLR
125.0000 mg | Freq: Once | INTRAMUSCULAR | Status: AC
  Administered 2020-08-31: 125 mg via INTRAVENOUS
  Filled 2020-08-31: qty 2

## 2020-08-31 NOTE — H&P (Signed)
History and Physical    Denise [name] DZH:[id number] DOB: Jul 31, 1945 DOA: 08/31/2020  PCP: Marya Fossa, PA-C  Patient coming from: Home  I have personally briefly reviewed patient's old medical records in [facility]  Chief Complaint: Shortness of breath  HPI: Denise [name] is a 75 y.o. 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  Patient reports 3 days of worsening shortness of breath with increased frequency of nonproductive cough compared to her baseline.  Dyspnea is worse with exertion.  She has had some subjective fevers and headache.  She reports chest wall pain that occurs with cough.  She has been feeling generally weak.  She has supplemental oxygen at home but is not using it consistently.  She has been using an albuterol inhaler without relief.  ED Course:  Initial vitals show BP 128/74, pulse 111, RR 22, temp not recorded, SPO2 85% on room air.  Patient was placed on 3 L supplemental O2 via [place] with improvement in SPO2 to 100%.  Labs show WBC 13.2, hemoglobin 13.3, platelets 308,000, sodium 137, potassium 4.1, bicarb 25, BUN 22, creatinine 1.10, serum glucose 124, high-sensitivity troponin I 24 > 25, procalcitonin <0.10.  SARS-CoV-2 PCR panel is ordered and pending collection.  Portable chest x-ray shows pulmonary fibrosis without acute airspace disease.  CTA chest is negative for evidence of PE.  Stable pulmonary fibrosis without acute airspace disease noted.  Changes consistent with pulmonary arterial hypertension seen.  Stable borderline enlarged mediastinal, hilar, and upper abdominal adenopathy is seen, unchanged since prior PET scan 10/31/2012, felt to be  reactive.  Patient was given 125 mg IV Solu-Medrol and the hospitalist service was consulted to admit for further evaluation and management.  Review of Systems: All systems reviewed and are negative except as documented in history of present illness above.   Past Medical History:  Diagnosis Date   Hypertension    Pulmonary fibrosis (HCC)    Pulmonary hypertension (HCC)    a. TTE 10/19:  EF of 65-70%, mild LVH, no RWMA, normal LV diastolic function, mildly dilated LA, RVSF cavity size normal with normal RVSF, mildly dilated RA, PASP 45 mmHg, dilated IVC consistent with elevated CVP, trivial pericardial effusion was noted   Sjogren's disease (HCC)    Thyroid disease     No past surgical history on file.  Social History:  reports that she has quit smoking. She has never used smokeless tobacco. She reports that she does not drink alcohol and does not use drugs.  No Known Allergies  No family history on file.   Prior to Admission medications   Medication Sig Start Date End Date Taking? Authorizing Provider  guaiFENesin-codeine 100-10 MG/5ML syrup Take 5 mLs by mouth every 4 (four) hours as needed for cough. 12/07/17   Erin Fulling, MD  hydroxychloroquine (PLAQUENIL) 200 MG tablet Take 1 tablet (200 mg total) by mouth daily. 11/09/17   Katha Hamming, MD  levothyroxine (SYNTHROID, LEVOTHROID) 100 MCG tablet Take 1 tablet (100 mcg total) by mouth daily at 6 (six) AM. 11/09/17   Katha Hamming, MD  naproxen (NAPROSYN) 500 MG tablet Take 1 tablet (500 mg total) by mouth 2 (two) times daily with a meal. 11/08/17   Katha Hamming, MD  pilocarpine (SALAGEN) 5 MG tablet Take 5  mg by mouth 3 (three) times daily.    [provider]  predniSONE (DELTASONE) 50 MG tablet Take one tablet once daily for the next five days. 07/05/19   Orvil FeilWoods, Jaclyn M, PA-C  Respiratory Therapy Supplies (FLUTTER) DEVI 1 each by Does not apply route 4 (four) times daily. 12/07/17   Erin FullingKasa, Kurian,  MD    Physical Exam: Vitals:   08/31/20 2000 08/31/20 2030 08/31/20 2130 08/31/20 2230  BP: 102/64 106/64 107/73 118/70  Pulse: 92 96 77 80  Resp: (!) 23 (!) 24 (!) 23 20  SpO2: 100% 99% 100% 100%  Weight:      Height:       Constitutional: Elderly woman resting in bed, NAD, calm, comfortable Eyes: PERRL, lids and conjunctivae normal ENMT: Mucous membranes are moist. Posterior pharynx clear of any exudate or lesions.Normal dentition.  Neck: normal, supple, no masses. Respiratory: Inspiratory crackles throughout the lung fields.  Normal respiratory effort while on 3 L O2 via [place]. No accessory muscle use.  Cardiovascular: Regular rate and rhythm, no murmurs / rubs / gallops. No extremity edema. 2+ pedal pulses. Abdomen: Mild right lower quadrant tenderness, no masses palpated. No hepatosplenomegaly. Musculoskeletal: no clubbing / cyanosis. No joint deformity upper and lower extremities. Good ROM, no contractures. Normal muscle tone.  Skin: Slightly diaphoretic.  No rashes, lesions, ulcers. No induration Neurologic: CN 2-12 grossly intact. Sensation intact. Strength 5/5 in all 4.  Psychiatric: Normal judgment and insight. Alert and oriented x 3. Normal mood.   Labs on Admission: I have personally reviewed following labs and imaging studies  CBC: Recent Labs  Lab 08/31/20 1918  WBC 13.2*  HGB 13.3  HCT 40.5  MCV 89.4  PLT 308   Basic Metabolic Panel: Recent Labs  Lab 08/31/20 1918  NA 137  K 4.1  CL 104  CO2 25  GLUCOSE 124*  BUN 22  CREATININE 1.10*  CALCIUM 9.1   GFR: Estimated Creatinine Clearance: 39.9 mL/min (A) (by C-G formula based on SCr of 1.1 mg/dL (H)). Liver Function Tests: No results for input(s): AST, ALT, ALKPHOS, BILITOT, PROT, ALBUMIN in the last 168 hours. No results for input(s): LIPASE, AMYLASE in the last 168 hours. No results for input(s): AMMONIA in the last 168 hours. Coagulation Profile: No results for input(s): INR, PROTIME in the last 168  hours. Cardiac Enzymes: No results for input(s): CKTOTAL, CKMB, CKMBINDEX, TROPONINI in the last 168 hours. BNP (last 3 results) No results for input(s): PROBNP in the last 8760 hours. HbA1C: No results for input(s): HGBA1C in the last 72 hours. CBG: No results for input(s): GLUCAP in the last 168 hours. Lipid Profile: No results for input(s): CHOL, HDL, LDLCALC, TRIG, CHOLHDL, LDLDIRECT in the last 72 hours. Thyroid Function Tests: No results for input(s): TSH, T4TOTAL, FREET4, T3FREE, THYROIDAB in the last 72 hours. Anemia Panel: No results for input(s): VITAMINB12, FOLATE, FERRITIN, TIBC, IRON, RETICCTPCT in the last 72 hours. Urine analysis:    Component Value Date/Time   COLORURINE YELLOW (A) 11/07/2017 0704   APPEARANCEUR CLEAR (A) 11/07/2017 0704   LABSPEC >1.046 (H) 11/07/2017 0704   PHURINE 5.0 11/07/2017 0704   GLUCOSEU NEGATIVE 11/07/2017 0704   HGBUR NEGATIVE 11/07/2017 0704   BILIRUBINUR NEGATIVE 11/07/2017 0704   KETONESUR NEGATIVE 11/07/2017 0704   PROTEINUR NEGATIVE 11/07/2017 0704   NITRITE NEGATIVE 11/07/2017 0704   LEUKOCYTESUR SMALL (A) 11/07/2017 0704    Radiological Exams on Admission: CT Angio Chest PE W/Cm &/Or Wo Cm  Result Date:  08/31/2020 CLINICAL DATA:  Shortness of breath, tachycardia, weakness EXAM: CT ANGIOGRAPHY CHEST WITH CONTRAST TECHNIQUE: Multidetector CT imaging of the chest was performed using the standard protocol during bolus administration of intravenous contrast. Multiplanar CT image reconstructions and MIPs were obtained to evaluate the vascular anatomy. CONTRAST:  36mL OMNIPAQUE IOHEXOL 350 MG/ML SOLN COMPARISON:  08/31/2020, 11/07/2017 FINDINGS: Cardiovascular: This is a technically adequate evaluation of the pulmonary vasculature. No filling defects or pulmonary emboli. Stable dilation of the pulmonary artery consistent with pulmonary arterial hypertension. Stable cardiomegaly without pericardial effusion. Continued ectasia of the  thoracic aorta without frank aneurysm. Stable atherosclerosis of the aorta and coronary vasculature. Mediastinum/Nodes: Borderline enlarged mediastinal and hilar adenopathy unchanged, likely reactive. Largest lymph node in the AP window measuring 12 mm and in the subcarinal station measuring 15 mm, stable since prior study. The thyroid, trachea, and esophagus are unremarkable. Lungs/Pleura: Basilar predominant fibrosis is again identified, unchanged since prior exam. No acute airspace disease, effusion, or pneumothorax. The central airways are patent. Upper Abdomen: No acute abnormality. Stable borderline enlarged lymph nodes within the central upper mesentery are nonspecific. Musculoskeletal: No acute or destructive bony lesions. Reconstructed images demonstrate no additional findings. Review of the MIP images confirms the above findings. IMPRESSION: 1. No evidence of pulmonary embolus. 2. Stable pulmonary fibrosis.  No acute airspace disease. 3. Dilated main pulmonary artery consistent with pulmonary arterial hypertension. 4. Stable borderline enlarged mediastinal, hilar, and upper abdominal adenopathy, likely reactive. This is unchanged since previous PET scan dated 10/31/2012. 5.  Aortic Atherosclerosis (ICD10-I70.0). Electronically Signed   By: Sharlet Salina M.D.   On: 08/31/2020 22:09   DG Chest Port 1 View  Result Date: 08/31/2020 CLINICAL DATA:  Short of breath, tachycardia, weakness EXAM: PORTABLE CHEST 1 VIEW COMPARISON:  07/05/2019 FINDINGS: Single frontal view of the chest demonstrates a stable cardiac silhouette. Diffuse pulmonary fibrosis is again identified, without significant change since prior exam. No superimposed airspace disease, effusion, or pneumothorax. No acute bony abnormalities. IMPRESSION: 1. Pulmonary fibrosis.  No acute airspace disease. Electronically Signed   By: Sharlet Salina M.D.   On: 08/31/2020 19:33    EKG: Personally reviewed. Normal sinus rhythm, RAD, no acute ischemic  changes.  Assessment/Plan Principal Problem:   Acute on chronic respiratory failure with hypoxia (HCC) Active Problems:   Sjogren's disease (HCC)   Pulmonary fibrosis (HCC)   Hypothyroidism   Denise [name] is a 75 y.o. 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  Acute on chronic respiratory failure with hypoxia due to ILD/pulmonary fibrosis exacerbation: CTA chest shows known pulmonary fibrosis without acute airspace disease or evidence of PE.  SPO2 was 85% on room air on arrival, currently requiring 3 L O2 via [place].  She does receive 125 mg IV Solu-Medrol. -Continue IV Solu-Medrol 40 mg twice daily -Place on scheduled DuoNebs with as needed albuterol -Continue supplemental oxygen and wean as able  Connective tissue disease with overlap of Sjogren's and scleroderma: Follows with rheumatology, Dr. Gavin Potters.  She had been taking Plaquenil and Salagen but is not clear if she is still taking these.  Medication reconciliation pending.  Hypothyroidism: Continue Synthroid.  DVT prophylaxis: Lovenox Code Status: Full code Family Communication: Discussed with patient's son-in-law at bedside Disposition Plan: From home and likely  discharge to home pending clinical progress Consults called: None Level of care: Med-Surg Admission status:  Status is: Observation  The patient remains OBS appropriate and will d/c before 2 midnights.  Dispo: The patient is from: Home              Anticipated d/c is to: Home              Patient currently is not medically stable to d/c.   Difficult to place patient No   Darreld Mclean MD [facility]  If 7PM-7AM, please  contact night-coverage [email or web address]  08/31/2020, 11:13 PM

## 2020-08-31 NOTE — ED Notes (Signed)
Pt placed on 3L [place]. Oxygen improving to 91%.

## 2020-08-31 NOTE — ED Triage Notes (Signed)
Reports symptoms for the past 3 days, worse today.  Reports shortness of breath, heart racing and weakness. Family reports uses 02 at home on prn basis.

## 2020-08-31 NOTE — ED Provider Notes (Signed)
[facility] Emergency Department Provider Note   ____________________________________________   Event Date/Time   First MD Initiated Contact with Patient 08/31/20 1926     (approximate)  I have reviewed the triage vital signs and the nursing notes.   HISTORY  Chief Complaint Shortness of Breath    HPI Denise [name] is a 75 y.o. female Who presents for worsening shortness of breath  LOCATION: Chest DURATION: 3 days prior to arrival TIMING: Worsening since onset SEVERITY: Severe QUALITY: Shortness of breath CONTEXT: Patient is a history of pulmonary fibrosis and is told that she needs oxygen and as needed at home up to 2 L however needs 3 L here to maintain oxygenation in the mid 90s MODIFYING FACTORS: Dyspnea on exertion rest partially relieves it ASSOCIATED SYMPTOMS: Denies   Per medical record review, history of pulmonary fibrosis and pulmonary hypertension          Past Medical History:  Diagnosis Date   Hypertension    Pulmonary fibrosis (HCC)    Pulmonary hypertension (HCC)    a. TTE 10/19:  EF of 65-70%, mild LVH, no RWMA, normal LV diastolic function, mildly dilated LA, RVSF cavity size normal with normal RVSF, mildly dilated RA, PASP 45 mmHg, dilated IVC consistent with elevated CVP, trivial pericardial effusion was noted   Sjogren's disease (HCC)    Thyroid disease     Patient Active Problem List   Diagnosis Date Noted   Acute on chronic respiratory failure with hypoxia (HCC) 08/31/2020   Sjogren's disease (HCC)    Pulmonary fibrosis (HCC)    Hypothyroidism    Chest pain 11/07/2017    No past surgical history on file.  Prior to Admission medications   Medication Sig Start Date End Date Taking? Authorizing Provider  guaiFENesin-codeine 100-10 MG/5ML syrup Take 5 mLs by mouth every 4 (four) hours as needed for cough. 12/07/17   Erin Fulling, MD  hydroxychloroquine (PLAQUENIL) 200 MG tablet Take 1 tablet (200  mg total) by mouth daily. 11/09/17   Katha Hamming, MD  levothyroxine (SYNTHROID, LEVOTHROID) 100 MCG tablet Take 1 tablet (100 mcg total) by mouth daily at 6 (six) AM. 11/09/17   Katha Hamming, MD  naproxen (NAPROSYN) 500 MG tablet Take 1 tablet (500 mg total) by mouth 2 (two) times daily with a meal. 11/08/17   Katha Hamming, MD  pilocarpine (SALAGEN) 5 MG tablet Take 5 mg by mouth 3 (three) times daily.    [provider]  predniSONE (DELTASONE) 50 MG tablet Take one tablet once daily for the next five days. 07/05/19   Orvil Feil, PA-C  Respiratory Therapy Supplies (FLUTTER) DEVI 1 each by Does not apply route 4 (four) times daily. 12/07/17   Erin Fulling, MD    Allergies Patient has no known allergies.  No family history on file.  Social History Social History   Tobacco Use   Smoking status: Former   Smokeless tobacco: Never  Building services engineer Use: Never used  Substance Use Topics   Alcohol use: No   Drug use: No    Review of Systems Constitutional: No fever/chills Eyes: No visual changes. ENT: No sore throat. Cardiovascular: Denies chest pain. Respiratory: Endorses shortness of breath. Gastrointestinal: No abdominal pain.  No nausea, no vomiting.  No diarrhea. Genitourinary: Negative for dysuria. Musculoskeletal: Negative for acute arthralgias Skin: Negative for rash. Neurological: Negative for headaches, weakness/numbness/paresthesias in any extremity Psychiatric: Negative for suicidal ideation/homicidal ideation   ____________________________________________  PHYSICAL EXAM:  VITAL SIGNS: ED Triage Vitals  Enc Vitals Group     BP 08/31/20 1901 128/74     Pulse Rate 08/31/20 1901 (!) 111     Resp 08/31/20 1901 (!) 22     Temp --      Temp src --      SpO2 08/31/20 1901 (!) 85 %     Weight 08/31/20 1912 132 lb (59.9 kg)     Height 08/31/20 1912 5\' 4"  (1.626 m)     Head Circumference --      Peak Flow --      Pain Score  08/31/20 1911 10     Pain Loc --      Pain Edu? --      Excl. in GC? --    Constitutional: Alert and oriented. Well appearing and in no acute distress. Eyes: Conjunctivae are normal. PERRL. Head: Atraumatic. Nose: No congestion/rhinnorhea. Mouth/Throat: Mucous membranes are moist. Neck: No stridor Cardiovascular: Grossly normal heart sounds.  Good peripheral circulation. Respiratory: 3 L nasal cannula in place.  Normal respiratory effort.  No retractions. Gastrointestinal: Soft and nontender. No distention. Musculoskeletal: No obvious deformities Neurologic:  Normal speech and language. No gross focal neurologic deficits are appreciated. Skin:  Skin is warm and dry. No rash noted. Psychiatric: Mood and affect are normal. Speech and behavior are normal.  ____________________________________________   LABS (all labs ordered are listed, but only abnormal results are displayed)  Labs Reviewed  BASIC METABOLIC PANEL - Abnormal; Notable for the following components:      Result Value   Glucose, Bld 124 (*)    Creatinine, Ser 1.10 (*)    GFR, Estimated 53 (*)    All other components within normal limits  CBC - Abnormal; Notable for the following components:   WBC 13.2 (*)    All other components within normal limits  TROPONIN I (HIGH SENSITIVITY) - Abnormal; Notable for the following components:   Troponin I (High Sensitivity) 24 (*)    All other components within normal limits  TROPONIN I (HIGH SENSITIVITY) - Abnormal; Notable for the following components:   Troponin I (High Sensitivity) 25 (*)    All other components within normal limits  RESP PANEL BY RT-PCR (FLU A&B, COVID) [id number]  PROCALCITONIN   ____________________________________________  EKG  ED ECG REPORT I, 09/02/20, the attending physician, personally viewed and interpreted this ECG.  Date: 08/31/2020 EKG Time: 1940 Rate: 94 Rhythm: normal sinus rhythm QRS Axis: normal Intervals: normal ST/T Wave  abnormalities: normal Narrative Interpretation: no evidence of acute ischemia  ____________________________________________  RADIOLOGY  ED MD interpretation: CT angiography of the chest shows no evidence of pulmonary embolism and shows pulmonary fibrosis  Official radiology report(s): CT Angio Chest PE W/Cm &/Or Wo Cm  Result Date: 08/31/2020 CLINICAL DATA:  Shortness of breath, tachycardia, weakness EXAM: CT ANGIOGRAPHY CHEST WITH CONTRAST TECHNIQUE: Multidetector CT imaging of the chest was performed using the standard protocol during bolus administration of intravenous contrast. Multiplanar CT image reconstructions and MIPs were obtained to evaluate the vascular anatomy. CONTRAST:  12mL OMNIPAQUE IOHEXOL 350 MG/ML SOLN COMPARISON:  08/31/2020, 11/07/2017 FINDINGS: Cardiovascular: This is a technically adequate evaluation of the pulmonary vasculature. No filling defects or pulmonary emboli. Stable dilation of the pulmonary artery consistent with pulmonary arterial hypertension. Stable cardiomegaly without pericardial effusion. Continued ectasia of the thoracic aorta without frank aneurysm. Stable atherosclerosis of the aorta and coronary vasculature. Mediastinum/Nodes: Borderline enlarged mediastinal and hilar adenopathy unchanged,  likely reactive. Largest lymph node in the AP window measuring 12 mm and in the subcarinal station measuring 15 mm, stable since prior study. The thyroid, trachea, and esophagus are unremarkable. Lungs/Pleura: Basilar predominant fibrosis is again identified, unchanged since prior exam. No acute airspace disease, effusion, or pneumothorax. The central airways are patent. Upper Abdomen: No acute abnormality. Stable borderline enlarged lymph nodes within the central upper mesentery are nonspecific. Musculoskeletal: No acute or destructive bony lesions. Reconstructed images demonstrate no additional findings. Review of the MIP images confirms the above findings. IMPRESSION: 1.  No evidence of pulmonary embolus. 2. Stable pulmonary fibrosis.  No acute airspace disease. 3. Dilated main pulmonary artery consistent with pulmonary arterial hypertension. 4. Stable borderline enlarged mediastinal, hilar, and upper abdominal adenopathy, likely reactive. This is unchanged since previous PET scan dated 10/31/2012. 5.  Aortic Atherosclerosis (ICD10-I70.0). Electronically Signed   By: Sharlet Salina M.D.   On: 08/31/2020 22:09   DG Chest Port 1 View  Result Date: 08/31/2020 CLINICAL DATA:  Short of breath, tachycardia, weakness EXAM: PORTABLE CHEST 1 VIEW COMPARISON:  07/05/2019 FINDINGS: Single frontal view of the chest demonstrates a stable cardiac silhouette. Diffuse pulmonary fibrosis is again identified, without significant change since prior exam. No superimposed airspace disease, effusion, or pneumothorax. No acute bony abnormalities. IMPRESSION: 1. Pulmonary fibrosis.  No acute airspace disease. Electronically Signed   By: Sharlet Salina M.D.   On: 08/31/2020 19:33    ____________________________________________   PROCEDURES  Procedure(s) performed (including Critical Care):  .1-3 Lead EKG Interpretation  Date/Time: 08/31/2020 11:22 PM Performed by: Merwyn Katos, MD Authorized by: Merwyn Katos, MD     Interpretation: normal     ECG rate:  79   ECG rate assessment: normal     Rhythm: sinus rhythm     Ectopy: none     Conduction: normal     ____________________________________________   INITIAL IMPRESSION / ASSESSMENT AND PLAN / ED COURSE  As part of my medical decision making, I reviewed the following data within the electronic medical record, if available:  Nursing notes reviewed and incorporated, Labs reviewed, EKG interpreted, Old chart reviewed, Radiograph reviewed and Notes from prior ED visits reviewed and incorporated        The patient appears to be suffering from a moderate/severe exacerbation of pulmonary fibrosis.  Based on the history,  exam, CXR/EKG reviewed by me, and further workup I dont suspect any other emergent cause of this presentation, such as pneumonia, acute coronary syndrome, congestive heart failure, pulmonary embolism, or pneumothorax.  ED Interventions: bronchodilators, steroids, antibiotics, reassess  Reassessment: After treatment, the patients shortness of breath is improving but patient is still requiring supplemental oxygenation   Disposition: Admit      ____________________________________________   FINAL CLINICAL IMPRESSION(S) / ED DIAGNOSES  Final diagnoses:  Shortness of breath  Pulmonary fibrosis [facility])     ED Discharge Orders     None        Note:  This document was prepared using Dragon voice recognition software and may include unintentional dictation errors.    Merwyn Katos, MD 08/31/20 2322

## 2020-09-01 ENCOUNTER — Encounter: Payer: Self-pay | Admitting: Internal Medicine

## 2020-09-01 DIAGNOSIS — Z79890 Hormone replacement therapy (postmenopausal): Secondary | ICD-10-CM | POA: Diagnosis not present

## 2020-09-01 DIAGNOSIS — J9621 Acute and chronic respiratory failure with hypoxia: Secondary | ICD-10-CM | POA: Diagnosis present

## 2020-09-01 DIAGNOSIS — J8410 Pulmonary fibrosis, unspecified: Secondary | ICD-10-CM

## 2020-09-01 DIAGNOSIS — Z87891 Personal history of nicotine dependence: Secondary | ICD-10-CM | POA: Diagnosis not present

## 2020-09-01 DIAGNOSIS — M349 Systemic sclerosis, unspecified: Secondary | ICD-10-CM | POA: Diagnosis present

## 2020-09-01 DIAGNOSIS — J9600 Acute respiratory failure, unspecified whether with hypoxia or hypercapnia: Secondary | ICD-10-CM | POA: Diagnosis present

## 2020-09-01 DIAGNOSIS — Z79899 Other long term (current) drug therapy: Secondary | ICD-10-CM | POA: Diagnosis not present

## 2020-09-01 DIAGNOSIS — M3500 Sicca syndrome, unspecified: Secondary | ICD-10-CM

## 2020-09-01 DIAGNOSIS — I10 Essential (primary) hypertension: Secondary | ICD-10-CM | POA: Diagnosis present

## 2020-09-01 DIAGNOSIS — J84112 Idiopathic pulmonary fibrosis: Secondary | ICD-10-CM | POA: Diagnosis present

## 2020-09-01 DIAGNOSIS — Z7952 Long term (current) use of systemic steroids: Secondary | ICD-10-CM | POA: Diagnosis not present

## 2020-09-01 DIAGNOSIS — I2721 Secondary pulmonary arterial hypertension: Secondary | ICD-10-CM | POA: Diagnosis present

## 2020-09-01 DIAGNOSIS — E039 Hypothyroidism, unspecified: Secondary | ICD-10-CM | POA: Diagnosis present

## 2020-09-01 DIAGNOSIS — R0602 Shortness of breath: Secondary | ICD-10-CM | POA: Diagnosis present

## 2020-09-01 DIAGNOSIS — Z20822 Contact with and (suspected) exposure to covid-19: Secondary | ICD-10-CM | POA: Diagnosis present

## 2020-09-01 DIAGNOSIS — J9601 Acute respiratory failure with hypoxia: Secondary | ICD-10-CM | POA: Diagnosis present

## 2020-09-01 LAB — CBC
HCT: 40.7 % (ref 36.0–46.0)
Hemoglobin: 13.3 g/dL (ref 12.0–15.0)
MCH: 29.2 pg (ref 26.0–34.0)
MCHC: 32.7 g/dL (ref 30.0–36.0)
MCV: 89.3 fL (ref 80.0–100.0)
Platelets: 282 10*3/uL (ref 150–400)
RBC: 4.56 MIL/uL (ref 3.87–5.11)
RDW: 15.3 % (ref 11.5–15.5)
WBC: 8.3 10*3/uL (ref 4.0–10.5)
nRBC: 0 % (ref 0.0–0.2)

## 2020-09-01 LAB — RESP PANEL BY RT-PCR (FLU A&B, COVID) ARPGX2
Influenza A by PCR: NEGATIVE
Influenza B by PCR: NEGATIVE
SARS Coronavirus 2 by RT PCR: NEGATIVE

## 2020-09-01 LAB — BASIC METABOLIC PANEL
Anion gap: 9 (ref 5–15)
BUN: 20 mg/dL (ref 8–23)
CO2: 26 mmol/L (ref 22–32)
Calcium: 9 mg/dL (ref 8.9–10.3)
Chloride: 103 mmol/L (ref 98–111)
Creatinine, Ser: 0.95 mg/dL (ref 0.44–1.00)
GFR, Estimated: >60 mL/min (ref >60)
Glucose, Bld: 208 mg/dL — ABNORMAL HIGH (ref 70–99)
Potassium: 4.8 mmol/L (ref 3.5–5.1)
Sodium: 138 mmol/L (ref 135–145)

## 2020-09-01 MED ORDER — NAPROXEN 500 MG PO TABS
500.0000 mg | ORAL_TABLET | Freq: Two times a day (BID) | ORAL | Status: DC
  Administered 2020-09-01: 500 mg via ORAL
  Filled 2020-09-01: qty 1

## 2020-09-01 MED ORDER — GUAIFENESIN-CODEINE 100-10 MG/5ML PO SOLN
5.0000 mL | ORAL | Status: DC | PRN
Start: 2020-09-01 — End: 2020-09-02

## 2020-09-01 MED ORDER — HYDROXYCHLOROQUINE SULFATE 200 MG PO TABS
200.0000 mg | ORAL_TABLET | Freq: Every day | ORAL | Status: DC
  Administered 2020-09-01 – 2020-09-02 (×2): 200 mg via ORAL
  Filled 2020-09-01 (×2): qty 1

## 2020-09-01 MED ORDER — SODIUM CHLORIDE 0.9 % IV SOLN
2.0000 g | Freq: Two times a day (BID) | INTRAVENOUS | Status: DC
  Administered 2020-09-01 – 2020-09-02 (×3): 2 g via INTRAVENOUS
  Filled 2020-09-01 (×7): qty 2

## 2020-09-01 MED ORDER — IPRATROPIUM-ALBUTEROL 0.5-2.5 (3) MG/3ML IN SOLN
3.0000 mL | Freq: Three times a day (TID) | RESPIRATORY_TRACT | Status: DC
  Administered 2020-09-01 – 2020-09-02 (×2): 3 mL via RESPIRATORY_TRACT
  Filled 2020-09-01 (×2): qty 3

## 2020-09-01 MED ORDER — PREDNISONE 20 MG PO TABS: 60.0000 mg | ORAL_TABLET | Freq: Every day | ORAL | Status: DC

## 2020-09-01 MED ORDER — PILOCARPINE HCL 5 MG PO TABS
5.0000 mg | ORAL_TABLET | Freq: Three times a day (TID) | ORAL | Status: DC
  Administered 2020-09-01 – 2020-09-02 (×5): 5 mg via ORAL
  Filled 2020-09-01 (×7): qty 1

## 2020-09-01 MED ORDER — PILOCARPINE HCL 5 MG PO TABS: 5.0000 mg | ORAL_TABLET | Freq: Three times a day (TID) | ORAL | Status: DC

## 2020-09-01 MED ORDER — PREDNISONE 20 MG PO TABS
60.0000 mg | ORAL_TABLET | Freq: Every day | ORAL | Status: DC
  Administered 2020-09-02: 60 mg via ORAL
  Filled 2020-09-01: qty 3

## 2020-09-01 NOTE — Consult Note (Signed)
NAME:  Denise [name], MRN:  [id number], DOB:  1945-06-25, LOS: 0 ADMISSION DATE:  08/31/2020, CONSULTATION DATE:  09/01/20 REFERRING MD: Butler Denmark, CHIEF COMPLAINT:  Cough, SOB   History of Present Illness:  75 y.o. 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 She reports increased cough and shortness of breath for the last 3 days. She uses as needed oxygen at home. She denies sick contacts at home. No fever, chills or weight loss. She is compliant with daily Plaquenil and there has been no recent change to her Rheumatologic medications or worsening of her underlying CTD.  She was initially hypoxic to the mid 80s on room air, tachycardic and tachypnic. She was started on 3L/min O2 via nasal cannula with improvement in her O2 sat.  Labs notable for WBC 13.2, negative procalcitonin, normal renal function.  Imaging shows similar distribution of basilar predominant groundglass opacities, subpleural reticulations and traction bronchiectasis without honeycombing. While the degree of parenchymal disease is increased as compared to 2018, it appears fairly similar to imaging obtained in 2019. 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 rate 16, height 5\' 4"  (1.626 m), weight 59.9 kg, SpO2 92 %.       No intake or output data in the 24 hours ending 09/01/20 1055 Filed  Weights   08/31/20 1912  Weight: 59.9 kg    Examination: General: thin Spanish speaking female in NAD HENT: [place]/AT, PERRL, Mallampati IV Lungs: diffuse inspiratory crackles, no W/R Cardiovascular: RRR, no M/R/G Abdomen: soft, NTND, NABS Extremities: no C/C/E Neuro: grossly intact GU: deferred  Resolved Hospital Problem list   N/A  Assessment & Plan:  Acute on chronic hypoxic respiratory failure Acute exacerbation of CTD-ILD  - No role for bronchoscopy at this time - Start prednisone 60 mg daily; taper plan to follow - Continue home Plaquenil - Start cefepime given underlying structural lung disease - Aim for goal negative daily fluid balance while hospitalized - Decrease Duo-Nebs to TID, she is unlikely to derive significant benefit from bronchodilator therapy - Collect sputum culture if able - Wean FiO2 for SpO2 goal 88-92%; she will likely require oxygen on discharge - Blood sugar control with addition of steroids - Please ensure follow up with Dr. 09/02/20 and Dr. Belia Heman on discharge   Best Practice (right click and "Reselect all SmartList Selections" daily)   Diet/type: Regular consistency (see orders) DVT prophylaxis: LMWH GI prophylaxis: N/A Lines: N/A Foley:  N/A Code Status:  full code Last date of multidisciplinary goals of care discussion [N/A]  Labs   CBC: Recent Labs  Lab 08/31/20 1918 09/01/20 0639  WBC 13.2* 8.3  HGB 13.3 13.3  HCT 40.5 40.7  MCV 89.4 89.3  PLT 308 282    Basic Metabolic Panel: Recent Labs  Lab 08/31/20 1918 09/01/20 0639  NA 137 138  K 4.1 4.8  CL 104 103  CO2 25 26  GLUCOSE 124* 208*  BUN 22 20  CREATININE  1.10* 0.95  CALCIUM 9.1 9.0   GFR: Estimated Creatinine Clearance: 46.2 mL/min (by C-G formula based on SCr of 0.95 mg/dL). Recent Labs  Lab 08/31/20 1918 08/31/20 2142 09/01/20 0639  PROCALCITON  --  <0.10  --   WBC 13.2*  --  8.3    Liver Function Tests: No results for input(s): AST, ALT, ALKPHOS,  BILITOT, PROT, ALBUMIN in the last 168 hours. No results for input(s): LIPASE, AMYLASE in the last 168 hours. No results for input(s): AMMONIA in the last 168 hours.  ABG No results found for: PHART, PCO2ART, PO2ART, HCO3, TCO2, ACIDBASEDEF, O2SAT   Coagulation Profile: No results for input(s): INR, PROTIME in the last 168 hours.  Cardiac Enzymes: No results for input(s): CKTOTAL, CKMB, CKMBINDEX, TROPONINI in the last 168 hours.  HbA1C: No results found for: HGBA1C  CBG: No results for input(s): GLUCAP in the last 168 hours.  Review of Systems:   Pertinent findings noted in HPI. All other systems reviewed and are negative unless otherwise documented.  Past Medical History:  She,  has a past medical history of Hypertension, Pulmonary fibrosis (HCC), Pulmonary hypertension (HCC), Sjogren's disease (HCC), and Thyroid disease.   Surgical History:  No past surgical history on file.   Social History:   reports that she has quit smoking. She has never used smokeless tobacco. She reports that she does not drink alcohol and does not use drugs.   Family History:  Her family history is not on file.   Allergies No Known Allergies   Home Medications  Prior to Admission medications   Medication Sig Start Date End Date Taking? Authorizing Provider  guaiFENesin-codeine 100-10 MG/5ML syrup Take 5 mLs by mouth every 4 (four) hours as needed for cough. 12/07/17   Erin Fulling, MD  hydroxychloroquine (PLAQUENIL) 200 MG tablet Take 1 tablet (200 mg total) by mouth daily. 11/09/17   Katha Hamming, MD  levothyroxine (SYNTHROID, LEVOTHROID) 100 MCG tablet Take 1 tablet (100 mcg total) by mouth daily at 6 (six) AM. 11/09/17   Katha Hamming, MD  naproxen (NAPROSYN) 500 MG tablet Take 1 tablet (500 mg total) by mouth 2 (two) times daily with a meal. 11/08/17   Katha Hamming, MD  pilocarpine (SALAGEN) 5 MG tablet Take 5 mg by mouth 3 (three) times daily.    [provider]  predniSONE (DELTASONE) 50 MG tablet Take one tablet once daily for the next five days. 07/05/19   Orvil Feil, PA-C  Respiratory Therapy Supplies (FLUTTER) DEVI 1 each by Does not apply route 4 (four) times daily. 12/07/17   Erin Fulling, MD     Marcelo Baldy, MD 09/01/20 12:44 PM

## 2020-09-01 NOTE — ED Notes (Signed)
Pt ambulatory to restroom with daughter  

## 2020-09-01 NOTE — Consult Note (Signed)
Pharmacy Antibiotic Note  Farryn Linares Poet Hineman is a 75 y.o. female admitted on 08/31/2020 with  ILD flare . Pharmacy has been consulted for cefepime dosing.  Plan: Will start cefepime 2g Q12h Continue to monitor renal function and make adjustments as clinically indicated  Height: 5\' 4"  (162.6 cm) Weight: 59.9 kg (132 lb) IBW/kg (Calculated) : 54.7  Temp (24hrs), Avg:98.5 F (36.9 C), Min:98.4 F (36.9 C), Max:98.6 F (37 C)  Recent Labs  Lab 08/31/20 1918 09/01/20 0639  WBC 13.2* 8.3  CREATININE 1.10* 0.95    Estimated Creatinine Clearance: 46.2 mL/min (by C-G formula based on SCr of 0.95 mg/dL).    No Known Allergies  Antimicrobials this admission: Cefepime 0821 >>    Thank you for allowing pharmacy to be a part of this patient's care.  0822, PharmD Pharmacy Resident  09/01/2020 12:55 PM

## 2020-09-01 NOTE — Progress Notes (Addendum)
PROGRESS NOTE    [name]   ZOX:[id number]  DOB: 05/16/1945  DOA: 08/31/2020 PCP: Denise [name], Teresa, PA-C   Brief Narrative:  Denise [name] a 75 y.o. 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 She's had about 3-4 days of cough with dry cough and dyspnea. She states her chest was hurting as well.  In ED > pulse ox 85% on room air, RR in 20s, HR in 100s   Subjective: Chest pain is slightly better today. She continues to have the cough. Her dyspnea is still present but is not as severe as it was yesterday.     Assessment & Plan:   Principal Problem:   Acute on chronic respiratory failure with hypoxia  - ILD flare - appreciate f/u but pulmonary> Prednisone and Cefepime started  - sputum culture ordered - cont TID nebs, Guaifenesin with codeine and Mucinex - cont to follow for improvement in symptoms  Active Problems:   CTD- Sjogren's disease (HCC) - cont Plaquenil and Pilocarpine tabs    Hypothyroidism - cont Levothyroxine  Time spent in minutes: 35 DVT prophylaxis: enoxaparin (LOVENOX) injection 40 mg Start: 08/31/20 2345  Code Status: full code Family Communication:  Level of Care: Level of care: Med-Surg Disposition Plan:  Status is: Observation  The patient will require care spanning > 2 midnights and should be moved to inpatient because: IV treatments appropriate due to intensity of illness or inability to take PO  Dispo: The patient is from: Home              Anticipated d/c is to: Home              Patient currently is not medically stable to d/c.   Difficult to place patient Yes      Consultants:  pulmonary Procedures:  none Antimicrobials:   Anti-infectives (From admission, onward)    Start     Dose/Rate Route Frequency Ordered Stop   09/01/20 1400  ceFEPIme (MAXIPIME) 2 g in sodium chloride 0.9 % 100 mL IVPB        2 g 200 mL/hr over 30 Minutes Intravenous Every 12 hours 09/01/20 1333     09/01/20 1245  hydroxychloroquine (PLAQUENIL) tablet 200 mg        200 mg Oral Daily 09/01/20 1240          Objective: Vitals:   09/01/20 0100 09/01/20 0200 09/01/20 0600 09/01/20 0632  BP: 125/80 122/82  124/78  Pulse: 91 73 (!) 58 68  Resp: (!) 24 20 12 16   Temp:  98.6 F (37 C)  98.4 F (36.9 C)  TempSrc:  Axillary  Axillary  SpO2: 98% 97% 98% 92%  Weight:      Height:       No intake or output data in the 24 hours ending 09/01/20 1432 Filed Weights   08/31/20 1912  Weight: 59.9 kg    Examination: General exam: Appears comfortable  HEENT: PERRLA, oral mucosa moist, no sclera icterus or thrush Respiratory system: crackles at bases  Respiratory effort normal. Cardiovascular system: S1 & S2 heard, RRR.   Gastrointestinal system: Abdomen soft, non-tender, nondistended. Normal bowel sounds. Central nervous system: Alert and oriented. No focal neurological deficits. Extremities: No cyanosis, clubbing or edema Skin: No rashes or ulcers  Psychiatry:  Mood & affect appropriate.     Data Reviewed: I have personally reviewed following labs and imaging studies  CBC: Recent Labs  Lab 08/31/20 1918 09/01/20 0639  WBC 13.2* 8.3  HGB 13.3 13.3  HCT 40.5 40.7  MCV 89.4 89.3  PLT 308 282   Basic Metabolic Panel: Recent Labs  Lab 08/31/20 1918 09/01/20 0639  NA 137 138  K 4.1 4.8  CL 104 103  CO2 25 26  GLUCOSE 124* 208*  BUN 22 20  CREATININE 1.10* 0.95  CALCIUM 9.1 9.0   GFR: Estimated Creatinine Clearance: 46.2 mL/min (by C-G formula based on SCr of 0.95 mg/dL). Liver Function Tests: No results for input(s): AST, ALT, ALKPHOS, BILITOT, PROT, ALBUMIN in the last 168 hours. No results for input(s): LIPASE,  AMYLASE in the last 168 hours. No results for input(s): AMMONIA in the last 168 hours. Coagulation Profile: No results for input(s): INR, PROTIME in the last 168 hours. Cardiac Enzymes: No results for input(s): CKTOTAL, CKMB, CKMBINDEX, TROPONINI in the last 168 hours. BNP (last 3 results) No results for input(s): PROBNP in the last 8760 hours. HbA1C: No results for input(s): HGBA1C in the last 72 hours. CBG: No results for input(s): GLUCAP in the last 168 hours. Lipid Profile: No results for input(s): CHOL, HDL, LDLCALC, TRIG, CHOLHDL, LDLDIRECT in the last 72 hours. Thyroid Function Tests: No results for input(s): TSH, T4TOTAL, FREET4, T3FREE, THYROIDAB in the last 72 hours. Anemia Panel: No results for input(s): VITAMINB12, FOLATE, FERRITIN, TIBC, IRON, RETICCTPCT in the last 72 hours. Urine analysis:    Component Value Date/Time   COLORURINE YELLOW (A) 11/07/2017 0704   APPEARANCEUR CLEAR (A) 11/07/2017 0704   LABSPEC >1.046 (H) 11/07/2017 0704   PHURINE 5.0 11/07/2017 0704   GLUCOSEU NEGATIVE 11/07/2017 0704   HGBUR NEGATIVE 11/07/2017 0704   BILIRUBINUR NEGATIVE 11/07/2017 0704   KETONESUR NEGATIVE 11/07/2017 0704   PROTEINUR NEGATIVE 11/07/2017 0704   NITRITE NEGATIVE 11/07/2017 0704   LEUKOCYTESUR SMALL (A) 11/07/2017 0704   Sepsis Labs: @LABRCNTIP (procalcitonin:4,lacticidven:4) ) Recent Results (from the past 240 hour(s))  Resp Panel by RT-PCR (Flu A&B, Covid) Nasopharyngeal Swab     Status: None   Collection Time: 08/31/20 11:44 PM   Specimen: Nasopharyngeal Swab; Nasopharyngeal(NP) swabs in vial transport medium  Result Value Ref Range Status   SARS Coronavirus 2 by RT PCR NEGATIVE NEGATIVE Final    Comment: (NOTE) SARS-CoV-2 target nucleic acids are NOT DETECTED.  The SARS-CoV-2 RNA is generally detectable in upper respiratory specimens during the acute phase of infection. The lowest concentration of SARS-CoV-2 viral copies this assay can detect is 138  copies/mL. A negative result does not preclude SARS-Cov-2 infection and should not be used as the sole basis for treatment or other patient management decisions. A negative result may occur with  improper specimen collection/handling, submission of specimen other than nasopharyngeal swab, presence of viral mutation(s) within the areas targeted by this assay, and inadequate number of viral copies(<138 copies/mL). A negative result must be combined with clinical observations, patient history, and epidemiological information. The expected result is Negative.  Fact Sheet for Patients:  09/02/20  Fact Sheet for Healthcare Providers:  BloggerCourse.com  This test is no t yet approved or cleared by the [place] FDA and  has been authorized for detection and/or diagnosis of SARS-CoV-2 by FDA under an Emergency Use Authorization (EUA). This EUA will remain  in effect (meaning this test can be used) for the duration of the COVID-19  declaration under Section 564(b)(1) of the Act, 21 U.S.C.section 360bbb-3(b)(1), unless the authorization is terminated  or revoked sooner.       Influenza A by PCR NEGATIVE NEGATIVE Final   Influenza B by PCR NEGATIVE NEGATIVE Final    Comment: (NOTE) The Xpert Xpress SARS-CoV-2/FLU/RSV plus assay is intended as an aid in the diagnosis of influenza from Nasopharyngeal swab specimens and should not be used as a sole basis for treatment. Nasal washings and aspirates are unacceptable for Xpert Xpress SARS-CoV-2/FLU/RSV testing.  Fact Sheet for Patients: BloggerCourse.com  Fact Sheet for Healthcare Providers: SeriousBroker.it  This test is not yet approved or cleared by the [place] FDA and has been authorized for detection and/or diagnosis of SARS-CoV-2 by FDA under an Emergency Use Authorization (EUA). This EUA will remain in effect (meaning  this test can be used) for the duration of the COVID-19 declaration under Section 564(b)(1) of the Act, 21 U.S.C. section 360bbb-3(b)(1), unless the authorization is terminated or revoked.  Performed at [facility], [address] [place], [place] [address]          Radiology Studies: CT Angio Chest PE W/Cm &/Or Wo Cm  Result Date: 08/31/2020 CLINICAL DATA:  Shortness of breath, tachycardia, weakness EXAM: CT ANGIOGRAPHY CHEST WITH CONTRAST TECHNIQUE: Multidetector CT imaging of the chest was performed using the standard protocol during bolus administration of intravenous contrast. Multiplanar CT image reconstructions and MIPs were obtained to evaluate the vascular anatomy. CONTRAST:  76mL OMNIPAQUE IOHEXOL 350 MG/ML SOLN COMPARISON:  08/31/2020, 11/07/2017 FINDINGS: Cardiovascular: This is a technically adequate evaluation of the pulmonary vasculature. No filling defects or pulmonary emboli. Stable dilation of the pulmonary artery consistent with pulmonary arterial hypertension. Stable cardiomegaly without pericardial effusion. Continued ectasia of the thoracic aorta without frank aneurysm. Stable atherosclerosis of the aorta and coronary vasculature. Mediastinum/Nodes: Borderline enlarged mediastinal and hilar adenopathy unchanged, likely reactive. Largest lymph node in the AP window measuring 12 mm and in the subcarinal station measuring 15 mm, stable since prior study. The thyroid, trachea, and esophagus are unremarkable. Lungs/Pleura: Basilar predominant fibrosis is again identified, unchanged since prior exam. No acute airspace disease, effusion, or pneumothorax. The central airways are patent. Upper Abdomen: No acute abnormality. Stable borderline enlarged lymph nodes within the central upper mesentery are nonspecific. Musculoskeletal: No acute or destructive bony lesions. Reconstructed images demonstrate no additional findings. Review of the MIP images confirms the above  findings. IMPRESSION: 1. No evidence of pulmonary embolus. 2. Stable pulmonary fibrosis.  No acute airspace disease. 3. Dilated main pulmonary artery consistent with pulmonary arterial hypertension. 4. Stable borderline enlarged mediastinal, hilar, and upper abdominal adenopathy, likely reactive. This is unchanged since previous PET scan dated 10/31/2012. 5.  Aortic Atherosclerosis (ICD10-I70.0). Electronically Signed   By: Sharlet Salina M.D.   On: 08/31/2020 22:09   DG Chest Port 1 View  Result Date: 08/31/2020 CLINICAL DATA:  Short of breath, tachycardia, weakness EXAM: PORTABLE CHEST 1 VIEW COMPARISON:  07/05/2019 FINDINGS: Single frontal view of the chest demonstrates a stable cardiac silhouette. Diffuse pulmonary fibrosis is again identified, without significant change since prior exam. No superimposed airspace disease, effusion, or pneumothorax. No acute bony abnormalities. IMPRESSION: 1. Pulmonary fibrosis.  No acute airspace disease. Electronically Signed   By: Sharlet Salina M.D.   On: 08/31/2020 19:33      Scheduled Meds:  enoxaparin (LOVENOX) injection  40 mg Subcutaneous Q24H   guaiFENesin  600 mg Oral BID   hydroxychloroquine  200 mg Oral Daily  ipratropium-albuterol  3 mL Nebulization TID   levothyroxine  100 mcg Oral Q0600   pilocarpine  5 mg Oral TID   [START ON 09/02/2020] predniSONE  60 mg Oral Q breakfast   sodium chloride flush  3 mL Intravenous Q12H   Continuous Infusions:  ceFEPime (MAXIPIME) IV       LOS: 0 days      Calvert Cantor, MD [facility] Pager: [email or web address] 09/01/2020, 2:32 PM

## 2020-09-02 DIAGNOSIS — R0602 Shortness of breath: Secondary | ICD-10-CM | POA: Diagnosis not present

## 2020-09-02 DIAGNOSIS — J9621 Acute and chronic respiratory failure with hypoxia: Secondary | ICD-10-CM | POA: Diagnosis not present

## 2020-09-02 DIAGNOSIS — J8410 Pulmonary fibrosis, unspecified: Secondary | ICD-10-CM | POA: Diagnosis not present

## 2020-09-02 LAB — RESPIRATORY PANEL BY PCR

## 2020-09-02 MED ORDER — IPRATROPIUM-ALBUTEROL 0.5-2.5 (3) MG/3ML IN SOLN
3.0000 mL | Freq: Three times a day (TID) | RESPIRATORY_TRACT | Status: DC
  Administered 2020-09-02: 3 mL via RESPIRATORY_TRACT
  Filled 2020-09-02: qty 3

## 2020-09-02 MED ORDER — PREDNISONE 10 MG (21) PO TBPK: ORAL_TABLET | ORAL | 0 refills | Status: DC

## 2020-09-02 MED ORDER — AMOXICILLIN-POT CLAVULANATE 875-125 MG PO TABS: 1.0000 | ORAL_TABLET | Freq: Two times a day (BID) | ORAL | 0 refills | Status: AC

## 2020-09-02 NOTE — Progress Notes (Signed)
Patient and daughter given instructions via interpeter [phone] to keep follow up appointments, when to return for worsening symptoms, IV taken out, Medication education, & patient discharged with oxygen.

## 2020-09-02 NOTE — Progress Notes (Signed)
[facility] Liaison note:  New referral for [employer] community Palliative to follow patient at home post discharge received from Dr. Karlene Lineman. TOC Charlynn Court notified.  Thank you for this referral. Dayna Barker BSN, RN, [facility] Liaison [facility] [phone]

## 2020-09-02 NOTE — Consult Note (Signed)
   NAME:  Denise [name], MRN:  [id number], DOB:  1945-09-05, LOS: 1 ADMISSION DATE:  08/31/2020, CONSULTATION DATE:  09/02/20 REFERRING MD: Butler Denmark, CHIEF COMPLAINT:  Cough, SOB   History of Present Illness:  Follow up SOB Follow up abnormal CT chest +fibrosis/UIP  Severe ILD Previous smoking history  SPANISH INTERPRETER USES I have explained to patient that she has severe lung damage She will have intermittent good and bad days I recommend moving her bedroom to first floor Patient to have stress test today  CT chest 1 year later does NOT show significant changes in ILD    Pertinent  Medical History  CTD with 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 rate 20, height 5\' 4"  (1.626 m), weight 59.9 kg, SpO2 95 %.        Intake/Output Summary (Last 24 hours) at 09/02/2020 0930 Last data filed at 09/02/2020 09/04/2020 Gross per 24 hour  Intake 200 ml  Output --  Net 200 ml   Filed Weights   08/31/20 1912  Weight: 59.9 kg    Review of Systems: +cough +SOB Other:  All other systems negative  Physical Examination:   General Appearance: No distress  EYES PERRLA, EOM intact.   NECK Supple, No JVD Pulmonary: normal breath sounds, No wheezing.  CardiovascularNormal S1,S2.  No m/r/g.   Abdomen: Benign, Soft, non-tender. Skin:   warm, no rashes, no ecchymosis  Extremities: normal, no cyanosis, clubbing. Neuro:without focal findings,  speech normal  PSYCHIATRIC: Mood, affect within normal limits.   Assessment & Plan:  Acute on chronic hypoxic respiratory failure Acute exacerbation of CTD-ILD, acute viral/bacterial   bronchitis  Continue steroids No indication for BRONCH Continue IV abx - Collect sputum culture if able - Wean FiO2 for SpO2 goal 88-92%; she will likely require oxygen on discharge  Plan of care relayed to patient using Spanish Interpreter   Best Practice (right click and "Reselect all SmartList Selections" daily)   Diet/type: Regular consistency (see orders) DVT prophylaxis: LMWH GI prophylaxis: N/A Lines: N/A Foley:  N/A Code Status:  full code      09/02/20, M.D.  [facility]  Medical Director [facility] Medical Director [facility] Cardio-Pulmonary Department

## 2020-09-02 NOTE — Clinical Social Work Note (Addendum)
Based on chart review, it appears patient gets oxygen through [employer]. Left voicemail for [employer] representative to confirm and see if she will need updated equipment. Patient was on prn oxygen prior to admission. Per MD, will now need 2 L continuous. Per RN, she dropped to 83% during sats test.  Charlynn Court, CSW [phone]  12:55 pm: Received call back from [employer] representative. Patient's current DME order is for 2 L and her concentrator at home holds 5 L. Patient already has a portable at home. No updated order/sats test needs. Told RN to notify me if family cannot get her tank here and [employer] would be able to bring her one to get her home.  Charlynn Court, CSW [phone]  1:38 pm: Asked [employer] representative to bring oxygen tank to room. No further concerns. CSW signing off.  Charlynn Court, CSW [phone]

## 2020-09-03 ENCOUNTER — Other Ambulatory Visit: Payer: Self-pay | Admitting: *Deleted

## 2020-09-03 DIAGNOSIS — J9611 Chronic respiratory failure with hypoxia: Secondary | ICD-10-CM

## 2020-09-03 DIAGNOSIS — J849 Interstitial pulmonary disease, unspecified: Secondary | ICD-10-CM

## 2020-09-03 NOTE — Discharge Summary (Signed)
5       [place] at [facility]   PATIENT NAME: Denise [name]    MR#:  [id number]  DATE OF BIRTH:  1945-05-26  DATE OF ADMISSION:  08/31/2020   ADMITTING PHYSICIAN: Calvert Cantor, MD  DATE OF DISCHARGE: 09/02/2020  3:44 PM  PRIMARY CARE PHYSICIAN: Marya Fossa, PA-C   ADMISSION DIAGNOSIS:  Acute respiratory failure (HCC) [J96.00] Shortness of breath [R06.02] Pulmonary fibrosis (HCC) [J84.10] SOB (shortness of breath) [R06.02] Acute on chronic respiratory failure with hypoxia (HCC) [J96.21] Acute hypoxemic respiratory failure (HCC) [J96.01] DISCHARGE DIAGNOSIS:  Principal Problem:   Acute on chronic respiratory failure with hypoxia (HCC) Active Problems:   Sjogren's disease (HCC)   Pulmonary fibrosis (HCC)   Hypothyroidism   Acute respiratory failure (HCC)   Acute hypoxemic respiratory failure (HCC)   SOB (shortness of breath)   Shortness of breath  SECONDARY DIAGNOSIS:   Past Medical History:  Diagnosis Date   Hypertension    Pulmonary fibrosis (HCC)    Pulmonary hypertension (HCC)    a. TTE 10/19:  EF of 65-70%, mild LVH, no RWMA, normal LV diastolic function, mildly dilated LA, RVSF cavity size normal with normal RVSF, mildly dilated RA, PASP 45 mmHg, dilated IVC consistent with elevated CVP, trivial pericardial effusion was noted   Sjogren's disease (HCC)    Thyroid disease    HOSPITAL COURSE:  75 year old female with severe interstitial lung disease/pulmonary fibrosis, chronic hypoxic respiratory failure, pulmonary artery hypertension, connective tissue disease/Sjogren's syndrome and scleroderma and hypothyroidism was admitted for worsening shortness of breath and hypoxia  Acute on chronic hypoxic respiratory failure Severe underlying pulmonary fibrosis/ILD with previous smoking history Type III pulmonary hypertension Due to ILD flare - Patient responded well to antibiotics, nebs and steroids -Patient qualified for oxygen.  She already  has oxygen tank at home for last 1 year but stopped using it for last 4-month for unclear reason.  She was followed by Dr. Belia Heman as an outpatient and had seen him about a year ago. -She will need 2 L oxygen at discharge and patient was instructed to remain compliant and use oxygen 24/7 -She seems very close to her baseline and her daughter and patient are in agreement with the discharge plans.  Pulmonary Dr. Belia Heman had also seen her while in the hospital and agrees with discharge plan. -Patient was instructed to follow-up with Dr. 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      DISCHARGE CONDITIONS:  Stable CONSULTS OBTAINED:   DRUG ALLERGIES:  No Known Allergies DISCHARGE MEDICATIONS:   Allergies as of 09/02/2020   No Known Allergies      Medication List     STOP taking these medications    predniSONE 50 MG tablet Commonly known as: DELTASONE Replaced by: predniSONE 10 MG (21) Tbpk tablet       TAKE these medications    amoxicillin-clavulanate 875-125 MG tablet Commonly known as: Augmentin Take 1 tablet by mouth every 12 (twelve) hours for 7 days.   Flutter Devi 1 each by Does not apply route 4 (four) times daily.   guaiFENesin-codeine 100-10 MG/5ML syrup Take 5 mLs by mouth every 4 (four) hours as needed for cough.   hydroxychloroquine 200 MG tablet Commonly known as: PLAQUENIL Take 1 tablet (200 mg total) by mouth daily.   levothyroxine 100 MCG tablet Commonly known  as: SYNTHROID Take 1 tablet (100 mcg total) by mouth daily at 6 (six) AM.   naproxen 500 MG tablet Commonly known as: NAPROSYN Take 1 tablet (500 mg total) by mouth 2 (two) times daily with a meal.   pilocarpine 5 MG tablet Commonly known as: SALAGEN Take 5 mg by mouth 3 (three) times daily.    predniSONE 10 MG (21) Tbpk tablet Commonly known as: STERAPRED UNI-PAK 21 TAB Start 60 mg po daily, taper 10 mg daily until finish Replaces: predniSONE 50 MG tablet       DISCHARGE INSTRUCTIONS:   DIET:  Regular diet DISCHARGE CONDITION:  Stable ACTIVITY:  Activity as tolerated OXYGEN:  Home Oxygen: Yes.    Oxygen Delivery: 2 liters/min via Patient connected to nasal cannula oxygen DISCHARGE LOCATION:  home with palliative care to follow  If you experience worsening of your admission symptoms, develop shortness of breath, life threatening emergency, suicidal or homicidal thoughts you must seek medical attention immediately by calling 911 or calling your MD immediately  if symptoms less severe.  You Must read complete instructions/literature along with all the possible adverse reactions/side effects for all the Medicines you take and that have been prescribed to you. Take any new Medicines after you have completely understood and accpet all the possible adverse reactions/side effects.   Please note  You were cared for by a hospitalist during your hospital stay. If you have any questions about your discharge medications or the care you received while you were in the hospital after you are discharged, you can call the unit and asked to speak with the hospitalist on call if the hospitalist that took care of you is not available. Once you are discharged, your primary care physician will handle any further medical issues. Please note that NO REFILLS for any discharge medications will be authorized once you are discharged, as it is imperative that you return to your primary care physician (or establish a relationship with a primary care physician if you do not have one) for your aftercare needs so that they can reassess your need for medications and monitor your lab values.    On the day of Discharge:  VITAL SIGNS:  Blood pressure 119/63, pulse 85, temperature 98.5 F (36.9 C),  temperature source Oral, resp. rate 20, height 5\' 4"  (1.626 m), weight 59.9 kg, SpO2 95 %. PHYSICAL EXAMINATION:  GENERAL:  75 y.o.-year-old patient lying in the bed with no acute distress.  EYES: Pupils equal, round, reactive to light and accommodation. No scleral icterus. Extraocular muscles intact.  HEENT: Head atraumatic, normocephalic. Oropharynx and nasopharynx clear.  NECK:  Supple, no jugular venous distention. No thyroid enlargement, no tenderness.  LUNGS: Normal breath sounds bilaterally, no wheezing, rales,rhonchi or crepitation. No use of accessory muscles of respiration.  CARDIOVASCULAR: S1, S2 normal. No murmurs, rubs, or gallops.  ABDOMEN: Soft, non-tender, non-distended. Bowel sounds present. No organomegaly or mass.  EXTREMITIES: No pedal edema, cyanosis, or clubbing.  NEUROLOGIC: Cranial nerves II through XII are intact. Muscle strength 5/5 in all extremities. Sensation intact. Gait not checked.  PSYCHIATRIC: The patient is alert and oriented x 3.  SKIN: No obvious rash, lesion, or ulcer.  DATA REVIEW:   CBC Recent Labs  Lab 09/01/20 0639  WBC 8.3  HGB 13.3  HCT 40.7  PLT 282    Chemistries  Recent Labs  Lab 09/01/20 0639  NA 138  K 4.8  CL 103  CO2 26  GLUCOSE 208*  BUN 20  CREATININE  0.95  CALCIUM 9.0     Outpatient follow-up  Follow-up Information     Marya Fossa, PA-C. Go on 09/06/2020.   Specialty: Physician Assistant Why: 10am appointment Contact information: [address] [place] [address] [phone]         Erin Fulling, MD. Schedule an appointment as soon as possible for a visit in 1 week(s).   Specialties: Pulmonary Disease, Cardiology Why: [facility] Discharge F/UP Contact information: [address] [place] [address] [phone]                 30 Day Unplanned Readmission Risk Score    Flowsheet Row ED to Hosp-Admission (Discharged) from 08/31/2020 in [facility]  30 Day Unplanned Readmission Risk Score (%) 7.21 Filed at 09/02/2020 1200       This score is the patient's risk of an unplanned readmission within 30 days of being discharged (0 -100%). The score is based on dignosis, age, lab data, medications, orders, and past utilization.   Low:  0-14.9   Medium: 15-21.9   High: 22-29.9   Extreme: 30 and above          Management plans discussed with the patient, family and they are in agreement.  CODE STATUS: Prior   TOTAL TIME TAKING CARE OF THIS PATIENT: 45 minutes.    Delfino Lovett M.D on 09/03/2020 at 2:55 PM  [facility]   CC: Primary care physician; Marya Fossa, PA-C   Note: This dictation was prepared with Dragon dictation along with smaller phrase technology. Any transcriptional errors that result from this process are unintentional.

## 2020-09-17 ENCOUNTER — Other Ambulatory Visit: Payer: Self-pay

## 2020-09-17 ENCOUNTER — Encounter: Payer: Self-pay | Admitting: Adult Health

## 2020-09-17 ENCOUNTER — Ambulatory Visit (INDEPENDENT_AMBULATORY_CARE_PROVIDER_SITE_OTHER): Payer: Medicaid Other | Admitting: Adult Health

## 2020-09-17 VITALS — BP 108/60 | HR 72 | Temp 98.5°F | Ht 59.5 in | Wt 140.4 lb

## 2020-09-17 DIAGNOSIS — M3500 Sicca syndrome, unspecified: Secondary | ICD-10-CM

## 2020-09-17 DIAGNOSIS — J8410 Pulmonary fibrosis, unspecified: Secondary | ICD-10-CM

## 2020-09-17 DIAGNOSIS — R0602 Shortness of breath: Secondary | ICD-10-CM

## 2020-09-17 DIAGNOSIS — J9611 Chronic respiratory failure with hypoxia: Secondary | ICD-10-CM | POA: Diagnosis not present

## 2020-09-17 NOTE — Assessment & Plan Note (Signed)
Encouraged on o2 use . Use 2l/M 24 /7  Order for POC   Plan    Patient Instructions  Please wear Oxygen 2l/m all the time  Order for small portable oxygen concentrator .  Continue on current regimen  Activity as tolerated.  Continue follow up with Rheumatology as planned.  Follow up with Dr. Belia Heman in 6-8 weeks with PFTs and As needed   Please contact office for sooner follow up if symptoms do not improve or worsen or seek emergency care   Por favor use Oxgeno 2l/m todo el tiempo Pedido de concentrador de oxgeno porttil pequeo. Continuar con el rgimen actual Actividad segn tolerancia. Contine el seguimiento con Reumatologa segn lo planeado. Seguimiento con el Dr. Belia Heman en 6-8 semanas con PFT y segn sea necesario Comunquese con la oficina para un seguimiento ms rpido si los sntomas no mejoran o empeoran o busque atencin de Associate Professor

## 2020-09-17 NOTE — Patient Instructions (Addendum)
Please wear Oxygen 2l/m all the time  Order for small portable oxygen concentrator .  Continue on current regimen  Activity as tolerated.  Continue follow up with Rheumatology as planned.  Follow up with Dr. Belia Heman in 6-8 weeks with PFTs and As needed   Please contact office for sooner follow up if symptoms do not improve or worsen or seek emergency care   Por favor use Oxgeno 2l/m todo el tiempo Pedido de concentrador de oxgeno porttil pequeo. Continuar con el rgimen actual Actividad segn tolerancia. Contine el seguimiento con Reumatologa segn lo planeado. Seguimiento con el Dr. Belia Heman en 6-8 semanas con PFT y segn sea necesario Comunquese con la oficina para un seguimiento ms rpido si los sntomas no mejoran o empeoran o busque atencin de Associate Professor

## 2020-09-17 NOTE — Assessment & Plan Note (Signed)
CTD related ILD - recent flare Improved after steroids . Now oxygen dependent .  Continue on Plaquenil . Check PFT on return   Plan  Patient Instructions  Please wear Oxygen 2l/m all the time  Order for small portable oxygen concentrator .  Continue on current regimen  Activity as tolerated.  Continue follow up with Rheumatology as planned.  Follow up with Dr. Belia Heman in 6-8 weeks with PFTs and As needed   Please contact office for sooner follow up if symptoms do not improve or worsen or seek emergency care   Por favor use Oxgeno 2l/m todo el tiempo Pedido de concentrador de oxgeno porttil pequeo. Continuar con el rgimen actual Actividad segn tolerancia. Contine el seguimiento con Reumatologa segn lo planeado. Seguimiento con el Dr. Belia Heman en 6-8 semanas con PFT y segn sea necesario Comunquese con la oficina para un seguimiento ms rpido si los sntomas no mejoran o empeoran o busque atencin de Associate Professor

## 2020-09-17 NOTE — Assessment & Plan Note (Signed)
Continue on current regimen and follow up with Rheumatology .

## 2020-09-17 NOTE — Progress Notes (Signed)
@Patient  ID: , female    DOB: 02-10-1945, 75 y.o.   MRN: [id number]  Chief Complaint  Patient presents with   Follow-up    Referring provider: [id number]  HPI: 75 year old female former smoker followed for interstitial lung disease related to connective tissue disorder with Sjogren's/scleroderma features (dating back to 2014) Medical history significant for mild pulmonary hypertension Patient is from 2015, speaks Spanish, migraine to the [place] in [place]   TEST/EVENTS :  CT chest August 31, 2020 negative for PE, stable pulmonary fibrosis stable borderline enlarged adenopathy  TTE 10/19:  EF of 65-70%, mild LVH, no RWMA, normal LV diastolic function, mildly dilated LA, RVSF cavity size normal with normal RVSF, mildly dilated RA, PASP 45 mmHg, dilated IVC consistent with elevated CVP, trivial pericardial effusion was noted  CT chest October 2019 shows diffuse interstitial coarsening consistent with known pulmonary fibrosis.  Overall slight progression of disease since prior CT chest.  2018.  Bilateral hilar and mediastinal adenopathy.   09/17/2020 Follow up : ILD , post hospital follow-up (visit with Interpretor)  Patient returns for a follow-up visit.  She has underlying interstitial lung disease associated with connective tissue disorder.  Patient has Sjogren's with scleroderma features.  She has managed by rheumatology and is on Plaquenil daily. Patient was recently admitted last month for acute ILD flare and bronchitis.  She was treated with IV antibiotics, nebulized bronchodilators and steroids.  She did require oxygen at discharge at 2 L.  She was discharged on antibiotics and steroids.  But she has finished.  Since discharge patient says she is feeling better but remains short of breath. She can not use her portable tanks as she could not figure out how they work. O2 sats 84% on room air on arrival. Today in office walk test required 2l/m O2 to keep  sats >88-90%. POC device was used with O2 sats 93% on 2l/m . She wants a POC , can not lift or handle heavy tanks . She is by herself a lot.  Lives with daughter , light house chores. Gets short of breath easily .  Spends 6 months in [place]2020 and 6 months in [place] Plans to go in Dec. We discussed Oxygen options for travel.  No significant cough or wheezing .        No Known Allergies  Immunization History  Administered Date(s) Administered   Influenza, High Dose Seasonal PF 11/08/2017   Moderna Sars-Covid-2 Vaccination 06/07/2019   Pneumococcal Polysaccharide-23 11/08/2017    Past Medical History:  Diagnosis Date   Hypertension    Pulmonary fibrosis (HCC)    Pulmonary hypertension (HCC)    a. TTE 10/19:  EF of 65-70%, mild LVH, no RWMA, normal LV diastolic function, mildly dilated LA, RVSF cavity size normal with normal RVSF, mildly dilated RA, PASP 45 mmHg, dilated IVC consistent with elevated CVP, trivial pericardial effusion was noted   Sjogren's disease (HCC)    Thyroid disease     Tobacco History: Social History   Tobacco Use  Smoking Status Former  Smokeless Tobacco Never   Counseling given: Not Answered   Outpatient Medications Prior to Visit  Medication Sig Dispense Refill   guaiFENesin-codeine 100-10 MG/5ML syrup Take 5 mLs by mouth every 4 (four) hours as needed for cough. 120 mL 0   hydroxychloroquine (PLAQUENIL) 200 MG tablet Take 1 tablet (200 mg total) by mouth daily. 30 tablet 0   levothyroxine (SYNTHROID, LEVOTHROID) 100 MCG tablet Take  1 tablet (100 mcg total) by mouth daily at 6 (six) AM. 30 tablet 0   naproxen (NAPROSYN) 500 MG tablet Take 1 tablet (500 mg total) by mouth 2 (two) times daily with a meal. 10 tablet 0   pilocarpine (SALAGEN) 5 MG tablet Take 5 mg by mouth 3 (three) times daily.     predniSONE (STERAPRED UNI-PAK 21 TAB) 10 MG (21) TBPK tablet Start 60 mg po daily, taper 10 mg daily until finish 21 tablet 0   Respiratory Therapy Supplies  (FLUTTER) DEVI 1 each by Does not apply route 4 (four) times daily. 1 each 0   No facility-administered medications prior to visit.     Review of Systems:   Constitutional:   No  weight loss, night sweats,  Fevers, chills,  +fatigue, or  lassitude.  HEENT:   No headaches,  Difficulty swallowing,  Tooth/dental problems, or  Sore throat,                No sneezing, itching, ear ache, nasal congestion, post nasal drip,   CV:  No chest pain,  Orthopnea, PND, swelling in lower extremities, anasarca, dizziness, palpitations, syncope.   GI  No heartburn, indigestion, abdominal pain, nausea, vomiting, diarrhea, change in bowel habits, loss of appetite, bloody stools.   Resp: .  No chest wall deformity  Skin: no rash or lesions.  GU: no dysuria, change in color of urine, no urgency or frequency.  No flank pain, no hematuria   MS:  No joint pain or swelling.  No decreased range of motion.  No back pain.    Physical Exam  BP 108/60 (BP Location: Left Arm, Patient Position: Sitting, Cuff Size: Normal)   Pulse 72   Temp 98.5 F (36.9 C) (Oral)   Ht 4' 11.5" (1.511 m)   Wt 140 lb 6.4 oz (63.7 kg)   SpO2 90%   BMI 27.88 kg/m   GEN: A/Ox3; pleasant , NAD, elderly    HEENT:  /AT,   NOSE-clear, THROAT-clear, no lesions, no postnasal drip or exudate noted.   NECK:  Supple w/ fair ROM; no JVD; normal carotid impulses w/o bruits; no thyromegaly or nodules palpated; no lymphadenopathy.    RESP  BB crackles,  no accessory muscle use, no dullness to percussion  CARD:  RRR, no m/r/g, no peripheral edema, pulses intact, no cyanosis or clubbing.  GI:   Soft & nt; nml bowel sounds; no organomegaly or masses detected.   Musco: Warm bil, no deformities or joint swelling noted.   Neuro: alert, no focal deficits noted.    Skin: Warm, no lesions or rashes    Lab Results:  CBC  BMET   ProBNP No results found for: PROBNP  Imaging: CT Angio Chest PE W/Cm &/Or Wo Cm  Result  Date: 08/31/2020 CLINICAL DATA:  Shortness of breath, tachycardia, weakness EXAM: CT ANGIOGRAPHY CHEST WITH CONTRAST TECHNIQUE: Multidetector CT imaging of the chest was performed using the standard protocol during bolus administration of intravenous contrast. Multiplanar CT image reconstructions and MIPs were obtained to evaluate the vascular anatomy. CONTRAST:  13mL OMNIPAQUE IOHEXOL 350 MG/ML SOLN COMPARISON:  08/31/2020, 11/07/2017 FINDINGS: Cardiovascular: This is a technically adequate evaluation of the pulmonary vasculature. No filling defects or pulmonary emboli. Stable dilation of the pulmonary artery consistent with pulmonary arterial hypertension. Stable cardiomegaly without pericardial effusion. Continued ectasia of the thoracic aorta without frank aneurysm. Stable atherosclerosis of the aorta and coronary vasculature. Mediastinum/Nodes: Borderline enlarged mediastinal and hilar adenopathy unchanged, likely  reactive. Largest lymph node in the AP window measuring 12 mm and in the subcarinal station measuring 15 mm, stable since prior study. The thyroid, trachea, and esophagus are unremarkable. Lungs/Pleura: Basilar predominant fibrosis is again identified, unchanged since prior exam. No acute airspace disease, effusion, or pneumothorax. The central airways are patent. Upper Abdomen: No acute abnormality. Stable borderline enlarged lymph nodes within the central upper mesentery are nonspecific. Musculoskeletal: No acute or destructive bony lesions. Reconstructed images demonstrate no additional findings. Review of the MIP images confirms the above findings. IMPRESSION: 1. No evidence of pulmonary embolus. 2. Stable pulmonary fibrosis.  No acute airspace disease. 3. Dilated main pulmonary artery consistent with pulmonary arterial hypertension. 4. Stable borderline enlarged mediastinal, hilar, and upper abdominal adenopathy, likely reactive. This is unchanged since previous PET scan dated 10/31/2012. 5.   Aortic Atherosclerosis (ICD10-I70.0). Electronically Signed   By: Sharlet SalinaMichael  Brown M.D.   On: 08/31/2020 22:09   DG Chest Port 1 View  Result Date: 08/31/2020 CLINICAL DATA:  Short of breath, tachycardia, weakness EXAM: PORTABLE CHEST 1 VIEW COMPARISON:  07/05/2019 FINDINGS: Single frontal view of the chest demonstrates a stable cardiac silhouette. Diffuse pulmonary fibrosis is again identified, without significant change since prior exam. No superimposed airspace disease, effusion, or pneumothorax. No acute bony abnormalities. IMPRESSION: 1. Pulmonary fibrosis.  No acute airspace disease. Electronically Signed   By: Sharlet SalinaMichael  Brown M.D.   On: 08/31/2020 19:33      No flowsheet data found.  No results found for: NITRICOXIDE      Assessment & Plan:   Pulmonary fibrosis (HCC) CTD related ILD - recent flare Improved after steroids . Now oxygen dependent .  Continue on Plaquenil . Check PFT on return   Plan  Patient Instructions  Please wear Oxygen 2l/m all the time  Order for small portable oxygen concentrator .  Continue on current regimen  Activity as tolerated.  Continue follow up with Rheumatology as planned.  Follow up with Dr. Belia HemanKasa in 6-8 weeks with PFTs and As needed   Please contact office for sooner follow up if symptoms do not improve or worsen or seek emergency care   Por favor use Oxgeno 2l/m todo el tiempo Pedido de concentrador de oxgeno porttil pequeo. Continuar con el rgimen actual Actividad segn tolerancia. Contine el seguimiento con Reumatologa segn lo planeado. Seguimiento con el Dr. Belia HemanKasa en 6-8 semanas con PFT y segn sea necesario Comunquese con la oficina para un seguimiento ms rpido si los sntomas no mejoran o empeoran o busque atencin de emergencia        Sjogren's disease (HCC) Continue on current regimen and follow up with Rheumatology .    Chronic respiratory failure with hypoxia (HCC) Encouraged on o2 use . Use 2l/M 24 /7  Order  for POC   Plan    Patient Instructions  Please wear Oxygen 2l/m all the time  Order for small portable oxygen concentrator .  Continue on current regimen  Activity as tolerated.  Continue follow up with Rheumatology as planned.  Follow up with Dr. Belia HemanKasa in 6-8 weeks with PFTs and As needed   Please contact office for sooner follow up if symptoms do not improve or worsen or seek emergency care   Por favor use Oxgeno 2l/m todo el tiempo Pedido de concentrador de oxgeno porttil pequeo. Continuar con el rgimen actual Actividad segn tolerancia. Contine el seguimiento con Reumatologa segn lo planeado. Seguimiento con el Dr. Belia HemanKasa en 6-8 semanas con PFT y segn sea  necesario Comunquese con la oficina para un seguimiento ms rpido si los sntomas no mejoran o empeoran o busque atencin de Associate Professor          I spent   40 minutes dedicated to the care of this patient on the date of this encounter to include pre-visit review of records, face-to-face time with the patient discussing conditions above, post visit ordering of testing, clinical documentation with the electronic health record, making appropriate referrals as documented, and communicating necessary findings to members of the patients care team.    Rubye Oaks, NP 09/17/2020

## 2020-10-10 ENCOUNTER — Other Ambulatory Visit: Payer: Medicaid Other | Admitting: Student

## 2020-10-10 ENCOUNTER — Other Ambulatory Visit: Payer: Self-pay

## 2020-10-10 DIAGNOSIS — J309 Allergic rhinitis, unspecified: Secondary | ICD-10-CM

## 2020-10-10 DIAGNOSIS — R0602 Shortness of breath: Secondary | ICD-10-CM

## 2020-10-10 DIAGNOSIS — K5900 Constipation, unspecified: Secondary | ICD-10-CM

## 2020-10-10 DIAGNOSIS — Z515 Encounter for palliative care: Secondary | ICD-10-CM

## 2020-10-10 NOTE — Progress Notes (Signed)
[facility] Palliative Care Consult Note Telephone: [phone]  Fax: [phone]   Date of encounter: 10/10/20 12:15 PM PATIENT NAME: Zenaya Ulatowski [address] [place] [address]   [phone] (home)  DOB: 1946-01-10 MRN: [id number] PRIMARY CARE PROVIDER:    Renette Butters  [address] [place] [address] [phone]  REFERRING PROVIDER:   Kerri Perches, PA-C [place],  [place] [address] [phone]  RESPONSIBLE PARTY:    Contact Information     Name Relation Home Work Mobile   [place] Daughter   [phone]   [facility] Daughter [phone]     Clent Jacks   [phone]        I met face to face with patient in the home. Palliative Care was asked to follow this patient by consultation request of  Kerri Perches, PA-C to address advance care planning and complex medical decision making. This is the initial visit.   Joint visit made with Spanish interpreter, Marla.                                   ASSESSMENT AND PLAN / RECOMMENDATIONS:   Advance Care Planning/Goals of Care: Goals include to maximize quality of life and symptom management. Patient/health care surrogate gave his/her permission to discuss.Our advance care planning conversation included a discussion about:    The value and importance of advance care planning  Experiences with loved ones who have been seriously ill or have died  Exploration of personal, cultural or spiritual beliefs that might influence medical decisions  Exploration of goals of care in the event of a sudden injury or illness  Introduced conversation of CPR; will discuss further with her family. CODE STATUS: Full Code  Symptom Management/Plan:  Shortness of breath-secondary to pulmonary fibrosis, ILD, acute on chronic respiratory failure. Continue oxygen at 2 lpm continuously.   Allergic rhinitis-symptoms consistent with  allergic rhinitis. Patient also showing signs of viral cold; symptoms are improving. Continue loratadine 10 mg daily.  Constipation-start Senna S one tablet daily; well balanced diet with fiber encouraged.   Follow up Palliative Care Visit: Palliative care will continue to follow for complex medical decision making, advance care planning, and clarification of goals. Return in 6 weeks or prn.  I spent 60 minutes providing this consultation. More than 50% of the time in this consultation was spent in counseling and care coordination.    PPS: 50%  HOSPICE ELIGIBILITY/DIAGNOSIS: TBD  Chief Complaint: Palliative Medicine initial consult visit.   HISTORY OF PRESENT ILLNESS:  Deetta Siegmann is a 75 y.o. year old female  with ILD, pulmonary fibrosis, mild pulmonary hypertension, Sjorgrens syndrome, hypothyroidism. Recently hospitalized 8/20-8/22/22 shortness of breath, acute on chronic respiratory failure with hypoxia, pulmonary fibrosis.  Patient resides at home with her daughter and family. She reports shortness of breath being improved, since wearing oxygen more frequently.  Se is wearing oxygen throughout the day at 2 L/min. Denies pain, chest pain. Reports having a cold the past 4 days; taking OTC cold medicines and is starting to feel better. She endorses some nasal congestion, clear, runny nose, post nasal drip, itchy throat. Denies any sinus pain or tenderness, no sore throat. Denies fever or chills. Denies nausea; endorses constipation. Bowel movement every 3 days. Taking daily probiotic. Appetite is fair. Eating one full meal a day; small snacks throughout the day.  Sleeping well. Able to complete her adl's; no recent falls or injury. Daughter reports to interpreter, that patient has been feeling more depressed, not wanting to go out of house much. Patient states that her mood is improving. Planning to go to [place] November 27th for 5 months.    History obtained from review  of EMR, discussion with primary team, and interview with family, facility staff/caregiver and/or Ms. Crista Elliot No Name.  I reviewed available labs, medications, imaging, studies and related documents from the EMR.  Records reviewed and summarized above.   ROS  General: NAD EYES: denies vision changes ENMT: denies dysphagia Cardiovascular: denies chest pain Pulmonary: denies cough, SOB with exertion Abdomen: endorses constipation, endorses continence of bowel GU: denies dysuria MSK:  denies weakness,  no falls reported Skin: denies rashes or wounds Neurological: denies pain, denies insomnia Psych: Endorses stable mood Heme/lymph/immuno: denies bruises, abnormal bleeding  Physical Exam: Weight: 138 pounds Pulse 84, resp 16, b/p 130/70, sats 99% at 2 lpm Constitutional: NAD General: frail appearing, thin EYES: anicteric sclera, lids intact, no discharge  ENMT: intact hearing, oral mucous membranes moist, dentition intact, no sinus pain or tenderness with palpation, posterior oropharynx streaked CV: S1S2, RRR, no LE edema Pulmonary: LCTA, no increased work of breathing, no cough, room air Abdomen:  normo-active BS + 4 quadrants, soft and non tender GU: deferred MSK: moves all extremities, ambulatory Skin: warm and dry, no rashes or wounds on visible skin Neuro:  no generalized weakness, A & O x 3 Psych: non-anxious affect, pleasant Hem/lymph/immuno: no widespread bruising CURRENT PROBLEM LIST:  Patient Active Problem List   Diagnosis Date Noted   Chronic respiratory failure with hypoxia (HCC) 09/17/2020   SOB (shortness of breath)    Shortness of breath    Acute respiratory failure ([place]) 09/01/2020   Acute hypoxemic respiratory failure ([place]) 09/01/2020   Acute on chronic respiratory failure with hypoxia ([place]) 08/31/2020   Sjogren's disease ([place])    Pulmonary fibrosis ([place])    Hypothyroidism    Chest pain 11/07/2017   PAST MEDICAL HISTORY:  Active Ambulatory Problems     Diagnosis Date Noted   Chest pain 11/07/2017   Acute on chronic respiratory failure with hypoxia ([place]) 08/31/2020   Sjogren's disease ([place])    Pulmonary fibrosis (HCC)    Hypothyroidism    Acute respiratory failure ([place]) 09/01/2020   Acute hypoxemic respiratory failure (HCC) 09/01/2020   SOB (shortness of breath)    Shortness of breath    Chronic respiratory failure with hypoxia ([place]) 09/17/2020   Resolved Ambulatory Problems    Diagnosis Date Noted   No Resolved Ambulatory Problems   Past Medical History:  Diagnosis Date   Hypertension    Pulmonary hypertension ([place])    Thyroid disease    SOCIAL HX:  Social History   Tobacco Use   Smoking status: Former   Smokeless tobacco: Never  Substance Use Topics   Alcohol use: No   FAMILY HX:   ALLERGIES: No Known Allergies   PERTINENT MEDICATIONS:  Outpatient Encounter Medications as of 10/10/2020  Medication Sig   guaiFENesin-codeine 100-10 MG/5ML syrup Take 5 mLs by mouth every 4 (four) hours as needed for cough.   hydroxychloroquine (PLAQUENIL) 200 MG tablet Take 1 tablet (200 mg total) by mouth daily.   levothyroxine (SYNTHROID, LEVOTHROID) 100 MCG tablet Take 1 tablet (100 mcg total) by mouth daily at 6 (six) AM.   naproxen (NAPROSYN) 500 MG tablet Take 1 tablet (500 mg total) by mouth  2 (two) times daily with a meal.   pilocarpine (SALAGEN) 5 MG tablet Take 5 mg by mouth 3 (three) times daily.   predniSONE (STERAPRED UNI-PAK 21 TAB) 10 MG (21) TBPK tablet Start 60 mg po daily, taper 10 mg daily until finish   Respiratory Therapy Supplies (FLUTTER) DEVI 1 each by Does not apply route 4 (four) times daily.   No facility-administered encounter medications on file as of 10/10/2020.   Thank you for the opportunity to participate in the care of Ms. Crista Elliot [place].  The palliative care team will continue to follow. Please call our office at [phone] if we can be of additional assistance.   Ezekiel Slocumb, NP    COVID-19 PATIENT SCREENING TOOL Asked and negative response unless otherwise noted:  Have you had symptoms of covid, tested positive or been in contact with someone with symptoms/positive test in the past 5-10 days? No

## 2020-10-18 ENCOUNTER — Telehealth: Payer: Self-pay

## 2020-10-18 NOTE — Telephone Encounter (Signed)
Called and spoke to patient about upcoming covid test, nothing further needed.  

## 2020-10-23 ENCOUNTER — Other Ambulatory Visit
Admission: RE | Admit: 2020-10-23 | Discharge: 2020-10-23 | Disposition: A | Discharge status: Home / Self Care | Payer: Medicaid Other | Source: Ambulatory Visit | Attending: Adult Health | Admitting: Adult Health

## 2020-10-23 ENCOUNTER — Other Ambulatory Visit: Payer: Self-pay

## 2020-10-23 DIAGNOSIS — Z20822 Contact with and (suspected) exposure to covid-19: Secondary | ICD-10-CM | POA: Insufficient documentation

## 2020-10-23 DIAGNOSIS — Z01812 Encounter for preprocedural laboratory examination: Secondary | ICD-10-CM | POA: Diagnosis not present

## 2020-10-23 LAB — SARS CORONAVIRUS 2 (TAT 6-24 HRS): SARS Coronavirus 2: NEGATIVE

## 2020-10-24 ENCOUNTER — Ambulatory Visit: Payer: Medicaid Other | Attending: Adult Health

## 2020-10-24 DIAGNOSIS — J8410 Pulmonary fibrosis, unspecified: Secondary | ICD-10-CM

## 2020-10-24 DIAGNOSIS — R0602 Shortness of breath: Secondary | ICD-10-CM

## 2020-10-24 MED ORDER — ALBUTEROL SULFATE (2.5 MG/3ML) 0.083% IN NEBU
2.5000 mg | INHALATION_SOLUTION | Freq: Once | RESPIRATORY_TRACT | Status: AC
  Administered 2020-10-24: 2.5 mg via RESPIRATORY_TRACT
  Filled 2020-10-24: qty 3

## 2020-10-30 ENCOUNTER — Other Ambulatory Visit: Payer: Self-pay

## 2020-10-30 ENCOUNTER — Inpatient Hospital Stay
Admission: EM | Admit: 2020-10-30 | Discharge: 2020-11-01 | DRG: 193 | Disposition: A | Discharge status: Home / Self Care | Payer: Medicaid Other | Attending: Family Medicine | Admitting: Family Medicine

## 2020-10-30 ENCOUNTER — Emergency Department: Payer: Medicaid Other

## 2020-10-30 DIAGNOSIS — Z20822 Contact with and (suspected) exposure to covid-19: Secondary | ICD-10-CM | POA: Diagnosis present

## 2020-10-30 DIAGNOSIS — J8410 Pulmonary fibrosis, unspecified: Secondary | ICD-10-CM | POA: Diagnosis present

## 2020-10-30 DIAGNOSIS — I10 Essential (primary) hypertension: Secondary | ICD-10-CM | POA: Diagnosis present

## 2020-10-30 DIAGNOSIS — Z79899 Other long term (current) drug therapy: Secondary | ICD-10-CM

## 2020-10-30 DIAGNOSIS — A419 Sepsis, unspecified organism: Secondary | ICD-10-CM

## 2020-10-30 DIAGNOSIS — J189 Pneumonia, unspecified organism: Principal | ICD-10-CM | POA: Diagnosis present

## 2020-10-30 DIAGNOSIS — Z9981 Dependence on supplemental oxygen: Secondary | ICD-10-CM

## 2020-10-30 DIAGNOSIS — R059 Cough, unspecified: Secondary | ICD-10-CM

## 2020-10-30 DIAGNOSIS — E871 Hypo-osmolality and hyponatremia: Secondary | ICD-10-CM | POA: Diagnosis present

## 2020-10-30 DIAGNOSIS — Z79890 Hormone replacement therapy (postmenopausal): Secondary | ICD-10-CM

## 2020-10-30 DIAGNOSIS — J849 Interstitial pulmonary disease, unspecified: Secondary | ICD-10-CM

## 2020-10-30 DIAGNOSIS — I272 Pulmonary hypertension, unspecified: Secondary | ICD-10-CM | POA: Diagnosis present

## 2020-10-30 DIAGNOSIS — M3500 Sicca syndrome, unspecified: Secondary | ICD-10-CM | POA: Diagnosis present

## 2020-10-30 DIAGNOSIS — E039 Hypothyroidism, unspecified: Secondary | ICD-10-CM | POA: Diagnosis present

## 2020-10-30 DIAGNOSIS — J9621 Acute and chronic respiratory failure with hypoxia: Secondary | ICD-10-CM | POA: Diagnosis present

## 2020-10-30 DIAGNOSIS — M349 Systemic sclerosis, unspecified: Secondary | ICD-10-CM | POA: Diagnosis present

## 2020-10-30 DIAGNOSIS — Z87891 Personal history of nicotine dependence: Secondary | ICD-10-CM

## 2020-10-30 LAB — CBC WITH DIFFERENTIAL/PLATELET
Abs Immature Granulocytes: 0.05 10*3/uL (ref 0.00–0.07)
Basophils Absolute: 0.1 10*3/uL (ref 0.0–0.1)
Basophils Relative: 1 %
Eosinophils Absolute: 0.4 10*3/uL (ref 0.0–0.5)
Eosinophils Relative: 3 %
HCT: 40.9 % (ref 36.0–46.0)
Hemoglobin: 13.6 g/dL (ref 12.0–15.0)
Immature Granulocytes: 0 %
Lymphocytes Relative: 18 %
Lymphs Abs: 2.2 10*3/uL (ref 0.7–4.0)
MCH: 28.6 pg (ref 26.0–34.0)
MCHC: 33.3 g/dL (ref 30.0–36.0)
MCV: 86.1 fL (ref 80.0–100.0)
Monocytes Absolute: 1 10*3/uL (ref 0.1–1.0)
Monocytes Relative: 8 %
Neutro Abs: 8.6 10*3/uL — ABNORMAL HIGH (ref 1.7–7.7)
Neutrophils Relative %: 70 %
Platelets: 306 10*3/uL (ref 150–400)
RBC: 4.75 MIL/uL (ref 3.87–5.11)
RDW: 14.7 % (ref 11.5–15.5)
WBC: 12.4 10*3/uL — ABNORMAL HIGH (ref 4.0–10.5)
nRBC: 0 % (ref 0.0–0.2)

## 2020-10-30 LAB — COMPREHENSIVE METABOLIC PANEL
ALT: 18 U/L (ref 0–44)
AST: 32 U/L (ref 15–41)
Albumin: 3.6 g/dL (ref 3.5–5.0)
Alkaline Phosphatase: 88 U/L (ref 38–126)
Anion gap: 11 (ref 5–15)
BUN: 12 mg/dL (ref 8–23)
CO2: 25 mmol/L (ref 22–32)
Calcium: 8.8 mg/dL — ABNORMAL LOW (ref 8.9–10.3)
Chloride: 96 mmol/L — ABNORMAL LOW (ref 98–111)
Creatinine, Ser: 0.87 mg/dL (ref 0.44–1.00)
GFR, Estimated: >60 mL/min (ref >60)
Glucose, Bld: 106 mg/dL — ABNORMAL HIGH (ref 70–99)
Potassium: 4.2 mmol/L (ref 3.5–5.1)
Sodium: 132 mmol/L — ABNORMAL LOW (ref 135–145)
Total Bilirubin: 0.9 mg/dL (ref 0.3–1.2)
Total Protein: 8.7 g/dL — ABNORMAL HIGH (ref 6.5–8.1)

## 2020-10-30 LAB — RESP PANEL BY RT-PCR (FLU A&B, COVID) ARPGX2
Influenza A by PCR: NEGATIVE
Influenza B by PCR: NEGATIVE
SARS Coronavirus 2 by RT PCR: NEGATIVE

## 2020-10-30 LAB — TROPONIN I (HIGH SENSITIVITY): Troponin I (High Sensitivity): 8 ng/L (ref <18)

## 2020-10-30 LAB — LACTIC ACID, PLASMA: Lactic Acid, Venous: 1 mmol/L (ref 0.5–1.9)

## 2020-10-30 MED ORDER — ACETAMINOPHEN 500 MG PO TABS
1000.0000 mg | ORAL_TABLET | Freq: Once | ORAL | Status: AC
  Administered 2020-10-30: 1000 mg via ORAL
  Filled 2020-10-30: qty 2

## 2020-10-30 MED ORDER — SODIUM CHLORIDE 0.9 % IV SOLN
2.0000 g | Freq: Once | INTRAVENOUS | Status: AC
  Administered 2020-10-31: 2 g via INTRAVENOUS
  Filled 2020-10-30: qty 2

## 2020-10-30 MED ORDER — VANCOMYCIN HCL 1500 MG/300ML IV SOLN
1500.0000 mg | Freq: Once | INTRAVENOUS | Status: AC
  Administered 2020-10-31: 1500 mg via INTRAVENOUS
  Filled 2020-10-30: qty 300

## 2020-10-30 NOTE — ED Notes (Signed)
Pt's oxygen increased to 3lpm in triage with rebound pox to 94% after one minute.

## 2020-10-30 NOTE — ED Triage Notes (Signed)
Pt on chronic oxygen, complains of fever, chest pain, cough, headache. Pt states has been present for several days. Information obtained with assist of interpreter # [phone]. Per daughter pt has had shob.

## 2020-10-30 NOTE — ED Provider Notes (Addendum)
[facility] Emergency Department Provider Note  ____________________________________________   Event Date/Time   First MD Initiated Contact with Patient 10/30/20 2317     (approximate)  I have reviewed the triage vital signs and the nursing notes.   HISTORY  Chief Complaint Cough and Fever   HPI Denise [name] is a 75 y.o. female past medical history of HTN, pulmonary fibrosis on 2 L of nasal cannula chronically, pulmonary hypertension, Sjogren's disease and thyroid disease who presents for assessment of 3 to 4 days of worsening cough and shortness of breath.  Patient states he has been coughing up yellow sputum.  She also some headache, fevers, chest discomfort and overall malaise and myalgias.  She has no abdominal pain, vomiting, diarrhea, burning with urination recent injuries or falls, earache, sore throat or any other clear sick symptoms.  She is not currently on antibiotics.  She is a former smoker and has not smoked in over a year she says.  No other acute concerns at this time.          Past Medical History:  Diagnosis Date   Hypertension    Pulmonary fibrosis (HCC)    Pulmonary hypertension (HCC)    a. TTE 10/19:  EF of 65-70%, mild LVH, no RWMA, normal LV diastolic function, mildly dilated LA, RVSF cavity size normal with normal RVSF, mildly dilated RA, PASP 45 mmHg, dilated IVC consistent with elevated CVP, trivial pericardial effusion was noted   Sjogren's disease (HCC)    Thyroid disease     Patient Active Problem List   Diagnosis Date Noted   Chronic respiratory failure with hypoxia (HCC) 09/17/2020   SOB (shortness of breath)    Shortness of breath    Acute respiratory failure (HCC) 09/01/2020   Acute hypoxemic respiratory failure (HCC) 09/01/2020   Acute on chronic respiratory failure with hypoxia (HCC) 08/31/2020   Sjogren's disease (HCC)    Pulmonary fibrosis (HCC)    Hypothyroidism    Chest pain 11/07/2017     No past surgical history on file.  Prior to Admission medications   Medication Sig Start Date End Date Taking? Authorizing Provider  atorvastatin (LIPITOR) 20 MG tablet Take 20 mg by mouth daily.    [provider]  guaiFENesin-codeine 100-10 MG/5ML syrup Take 5 mLs by mouth every 4 (four) hours as needed for cough. 12/07/17   Erin Fulling, MD  hydroxychloroquine (PLAQUENIL) 200 MG tablet Take 1 tablet (200 mg total) by mouth daily. 11/09/17   Katha Hamming, MD  levothyroxine (SYNTHROID, LEVOTHROID) 100 MCG tablet Take 1 tablet (100 mcg total) by mouth daily at 6 (six) AM. 11/09/17   Katha Hamming, MD  naproxen (NAPROSYN) 500 MG tablet Take 1 tablet (500 mg total) by mouth 2 (two) times daily with a meal. 11/08/17   Katha Hamming, MD  pilocarpine (SALAGEN) 5 MG tablet Take 5 mg by mouth 3 (three) times daily.    [provider]  predniSONE (STERAPRED UNI-PAK 21 TAB) 10 MG (21) TBPK tablet Start 60 mg po daily, taper 10 mg daily until finish 09/02/20   Delfino Lovett, MD  Respiratory Therapy Supplies (FLUTTER) DEVI 1 each by Does not apply route 4 (four) times daily. 12/07/17   Erin Fulling, MD    Allergies Patient has no known allergies.  No family history on file.  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  HENT:  Negative for sore throat.   Eyes:  Negative for pain.  Respiratory:  Positive for cough and shortness of breath. Negative for stridor.   Cardiovascular:  Positive for chest pain.  Gastrointestinal:  Negative for vomiting.  Genitourinary:  Negative for dysuria.  Musculoskeletal:  Negative for myalgias.  Skin:  Negative for rash.  Neurological:  Positive for headaches. Negative for seizures  and loss of consciousness.  Psychiatric/Behavioral:  Negative for suicidal ideas.   All other systems reviewed and are negative.    ____________________________________________   PHYSICAL EXAM:  VITAL SIGNS: ED Triage Vitals  Enc Vitals Group     BP 10/30/20 2224 115/74     Pulse Rate 10/30/20 2224 95     Resp 10/30/20 2224 (!) 22     Temp 10/30/20 2224 (!) 101.3 F (38.5 C)     Temp Source 10/30/20 2224 Oral     SpO2 10/30/20 2224 (!) 89 %     Weight 10/30/20 2221 140 lb (63.5 kg)     Height 10/30/20 2221 5\' 2"  (1.575 m)     Head Circumference --      Peak Flow --      Pain Score --      Pain Loc --      Pain Edu? --      Excl. in GC? --    Vitals:   10/30/20 2309 10/30/20 2324  BP:  123/80  Pulse:  89  Resp:  12  Temp: 100.3 F (37.9 C)   SpO2:  97%   Physical Exam Vitals and nursing note reviewed.  Constitutional:      General: She is not in acute distress.    Appearance: She is well-developed. She is ill-appearing.  HENT:     Head: Normocephalic and atraumatic.     Right Ear: External ear normal.     Left Ear: External ear normal.     Nose: Nose normal.  Eyes:     Conjunctiva/sclera: Conjunctivae normal.  Cardiovascular:     Rate and Rhythm: Normal rate and regular rhythm.     Heart sounds: No murmur heard. Pulmonary:     Effort: Pulmonary effort is normal. No respiratory distress.     Breath sounds: Wheezing and rhonchi present.  Abdominal:     Palpations: Abdomen is soft.     Tenderness: There is no abdominal tenderness.  Musculoskeletal:     Cervical back: Neck supple.  Skin:    General: Skin is warm and dry.     Capillary Refill: Capillary refill takes less than 2 seconds.  Neurological:     Mental Status: She is alert and oriented to person, place, and time.  Psychiatric:        Mood and Affect: Mood normal.     ____________________________________________   LABS (all labs ordered are listed, but only abnormal results are  displayed)  Labs Reviewed  COMPREHENSIVE METABOLIC PANEL - Abnormal; Notable for the following components:      Result Value   Sodium 132 (*)    Chloride 96 (*)    Glucose, Bld 106 (*)    Calcium 8.8 (*)    Total Protein 8.7 (*)    All other components within normal limits  CBC WITH DIFFERENTIAL/PLATELET - Abnormal; Notable for  the following components:   WBC 12.4 (*)    Neutro Abs 8.6 (*)    All other components within normal limits  RESP PANEL BY RT-PCR (FLU A&B, COVID) ARPGX2  CULTURE, BLOOD (ROUTINE X 2)  CULTURE, BLOOD (ROUTINE X 2)  LACTIC ACID, PLASMA  PROTIME-INR  APTT  BRAIN NATRIURETIC PEPTIDE  TROPONIN I (HIGH SENSITIVITY)  TROPONIN I (HIGH SENSITIVITY)   ____________________________________________  EKG  Past medical history sinus rhythm with ventricular rate of 96, right axis deviation and some artifact versus nonspecific ST changes in lateral leads without other clearance of acute ischemia or significant arrhythmia. ____________________________________________  RADIOLOGY  ED MD interpretation: Chest x-ray shows cardiomegaly and bilateral multifocal opacities consistent with multifocal pneumonia in the setting of fibrosis and possible underlying edema.  There is no pneumothorax or large effusion.  Official radiology report(s): DG Chest Portable 1 View  Result Date: 10/30/2020 CLINICAL DATA:  Shortness of breath, fever EXAM: PORTABLE CHEST 1 VIEW COMPARISON:  08/31/2020 FINDINGS: Cardiomegaly. Diffuse bilateral airspace disease, worsening since prior study. No visible effusions. Aortic atherosclerosis. No acute bony abnormality. IMPRESSION: Cardiomegaly with diffuse bilateral airspace disease. This appears progressed since prior study and may reflect a combination of fibrosis and edema. Electronically Signed   By: Charlett Nose M.D.   On: 10/30/2020 23:39    ____________________________________________   PROCEDURES  Procedure(s) performed (including Critical  Care):  .Critical Care Performed by: Gilles Chiquito, MD Authorized by: Gilles Chiquito, MD   Critical care provider statement:    Critical care was necessary to treat or prevent imminent or life-threatening deterioration of the following conditions:  Respiratory failure and sepsis   Critical care was time spent personally by me on the following activities:  Review of old charts, re-evaluation of patient's condition, pulse oximetry, ordering and review of radiographic studies, ordering and review of laboratory studies, ordering and performing treatments and interventions, development of treatment plan with patient or surrogate, examination of patient and obtaining history from patient or surrogate   Care discussed with: admitting provider     ____________________________________________   INITIAL IMPRESSION / ASSESSMENT AND PLAN / ED COURSE      Patient presents with above-stated history and exam for assessment of several days of cough, chest pain, headaches, fevers and shortness of breath.  On arrival patient is noted to be hypoxic on 2 L and 89% which improved to 97% on 3 L.  He was noted be tachypneic at 22 and febrile at 101.3 with otherwise stable vital signs.  On exam she has bilateral rhonchi and on 3 L does not appear to be in significant respiratory distress.  Her abdomen is soft nontender throughout and she is awake and alert and not encephalopathic.  Differential includes acute CHF exacerbation, pleurisy, myocarditis, pericarditis, anemia, metabolic derangements, bacterial pneumonia, bronchitis and exacerbation of underlying residual lung disease.  ECG and nonelevated troponin not suggestive of ACS or myocarditis.  No significant arrhythmia at this time..  CMP shows no significant electrolyte or metabolic derangements.  Lactic acid is nonelevated 1.  CBC shows WBC count of 12.4 without acute anemia.  Normal platelets.  COVID influenza PCR is negative.  Chest x-ray shows  cardiomegaly and bilateral multifocal opacities consistent with multifocal pneumonia in the setting of fibrosis and possible underlying edema.  There is no pneumothorax or large effusion.  Given fever increased cough production with some significant yellow sputum suspect patient likely has an acute bacterial ammonia causing her acute on chronic hypoxic respiratory failure.  Given fever and leukocytosis she taking meets sepsis criteria although she is currently euvolemic and given her history of pulmonary hypertension and nonelevated lactic acid I think IV fluids at this time can potentially be more harmful than beneficial.  I will plan to admit to medicine service for further evaluation and management.  In addition I did obtain blood cultures and ordered broad-spectrum antibiotics to cover possible hospital associated pneumonia as patient was recently hospitalized in the last 90 days for similar presentation.  We will also give a dose of steroids and duo nebs.       ____________________________________________   FINAL CLINICAL IMPRESSION(S) / ED DIAGNOSES  Final diagnoses:  Recurrent pneumonia  Acute on chronic respiratory failure with hypoxia (HCC)  Sepsis, due to unspecified organism, unspecified whether acute organ dysfunction present (HCC)    Medications  ceFEPIme (MAXIPIME) 2 g in sodium chloride 0.9 % 100 mL IVPB (has no administration in time range)  vancomycin (VANCOREADY) IVPB 1500 mg/300 mL (has no administration in time range)  methylPREDNISolone sodium succinate (SOLU-MEDROL) 125 mg/2 mL injection 125 mg (has no administration in time range)  acetaminophen (TYLENOL) tablet 1,000 mg (1,000 mg Oral Given 10/30/20 2351)     ED Discharge Orders     None        Note:  This document was prepared using Dragon voice recognition software and may include unintentional dictation errors.    Gilles Chiquito, MD 10/31/20 0010    Gilles Chiquito, MD 10/31/20 [phone]

## 2020-10-30 NOTE — Progress Notes (Signed)
PHARMACY -  BRIEF ANTIBIOTIC NOTE   Pharmacy has received consult(s) for Cefepime and Vancomycin from an ED provider.  The patient's profile has been reviewed for ht/wt/allergies/indication/available labs.    One time order(s) placed for Cefepime 2 gm and Vancomycin 1500 mg per pt wt: 63.5 kg  Further antibiotics/pharmacy consults should be ordered by admitting physician if indicated.                       Thank you, Otelia Sergeant, PharmD, [facility] 10/30/2020 11:47 PM

## 2020-10-31 ENCOUNTER — Inpatient Hospital Stay (HOSPITAL_COMMUNITY)
Admit: 2020-10-31 | Discharge: 2020-10-31 | Disposition: A | Discharge status: Home / Self Care | Payer: Medicaid Other | Attending: Internal Medicine | Admitting: Internal Medicine

## 2020-10-31 DIAGNOSIS — E039 Hypothyroidism, unspecified: Secondary | ICD-10-CM | POA: Diagnosis present

## 2020-10-31 DIAGNOSIS — I10 Essential (primary) hypertension: Secondary | ICD-10-CM

## 2020-10-31 DIAGNOSIS — R0609 Other forms of dyspnea: Secondary | ICD-10-CM | POA: Diagnosis not present

## 2020-10-31 DIAGNOSIS — A419 Sepsis, unspecified organism: Secondary | ICD-10-CM | POA: Diagnosis not present

## 2020-10-31 DIAGNOSIS — Z79890 Hormone replacement therapy (postmenopausal): Secondary | ICD-10-CM | POA: Diagnosis not present

## 2020-10-31 DIAGNOSIS — I272 Pulmonary hypertension, unspecified: Secondary | ICD-10-CM | POA: Diagnosis present

## 2020-10-31 DIAGNOSIS — Z79899 Other long term (current) drug therapy: Secondary | ICD-10-CM | POA: Diagnosis not present

## 2020-10-31 DIAGNOSIS — E871 Hypo-osmolality and hyponatremia: Secondary | ICD-10-CM | POA: Diagnosis present

## 2020-10-31 DIAGNOSIS — J189 Pneumonia, unspecified organism: Secondary | ICD-10-CM | POA: Diagnosis present

## 2020-10-31 DIAGNOSIS — Z20822 Contact with and (suspected) exposure to covid-19: Secondary | ICD-10-CM | POA: Diagnosis present

## 2020-10-31 DIAGNOSIS — M3500 Sicca syndrome, unspecified: Secondary | ICD-10-CM

## 2020-10-31 DIAGNOSIS — Z87891 Personal history of nicotine dependence: Secondary | ICD-10-CM | POA: Diagnosis not present

## 2020-10-31 DIAGNOSIS — Z9981 Dependence on supplemental oxygen: Secondary | ICD-10-CM | POA: Diagnosis not present

## 2020-10-31 DIAGNOSIS — J9621 Acute and chronic respiratory failure with hypoxia: Secondary | ICD-10-CM | POA: Diagnosis present

## 2020-10-31 DIAGNOSIS — M349 Systemic sclerosis, unspecified: Secondary | ICD-10-CM | POA: Diagnosis present

## 2020-10-31 DIAGNOSIS — J8410 Pulmonary fibrosis, unspecified: Secondary | ICD-10-CM | POA: Diagnosis present

## 2020-10-31 LAB — ECHOCARDIOGRAM COMPLETE
AR max vel: 1.68 cm2
AV Area VTI: 2.17 cm2
AV Area mean vel: 1.73 cm2
AV Mean grad: 5.5 mmHg
AV Peak grad: 9 mmHg
Ao pk vel: 1.5 m/s
Area-P 1/2: 4.17 cm2
Height: 62 in
S' Lateral: 2.3 cm
Weight: 2240 oz

## 2020-10-31 LAB — TROPONIN I (HIGH SENSITIVITY): Troponin I (High Sensitivity): 8 ng/L (ref <18)

## 2020-10-31 LAB — PROTIME-INR
INR: 1.2 (ref 0.8–1.2)
Prothrombin Time: 14.9 seconds (ref 11.4–15.2)

## 2020-10-31 LAB — APTT: aPTT: 36 seconds (ref 24–36)

## 2020-10-31 LAB — BRAIN NATRIURETIC PEPTIDE: B Natriuretic Peptide: 209.6 pg/mL — ABNORMAL HIGH (ref 0.0–100.0)

## 2020-10-31 LAB — PROCALCITONIN: Procalcitonin: <0.1 ng/mL

## 2020-10-31 LAB — MRSA NEXT GEN BY PCR, NASAL: MRSA by PCR Next Gen: NOT DETECTED

## 2020-10-31 MED ORDER — AZITHROMYCIN 250 MG PO TABS
500.0000 mg | ORAL_TABLET | Freq: Every day | ORAL | Status: DC
  Administered 2020-10-31 – 2020-11-01 (×2): 500 mg via ORAL
  Filled 2020-10-31 (×2): qty 2

## 2020-10-31 MED ORDER — IPRATROPIUM-ALBUTEROL 0.5-2.5 (3) MG/3ML IN SOLN
3.0000 mL | Freq: Once | RESPIRATORY_TRACT | Status: AC
  Administered 2020-10-31: 3 mL via RESPIRATORY_TRACT
  Filled 2020-10-31: qty 3

## 2020-10-31 MED ORDER — SODIUM CHLORIDE 0.9 % IV SOLN: 500.0000 mg | Freq: Every day | INTRAVENOUS | Status: DC

## 2020-10-31 MED ORDER — ACETAMINOPHEN 325 MG PO TABS: 650.0000 mg | ORAL_TABLET | Freq: Four times a day (QID) | ORAL | Status: DC | PRN

## 2020-10-31 MED ORDER — SODIUM CHLORIDE 0.9 % IV SOLN: 2.0000 g | INTRAVENOUS | Status: DC

## 2020-10-31 MED ORDER — METHYLPREDNISOLONE SODIUM SUCC 125 MG IJ SOLR
125.0000 mg | Freq: Once | INTRAMUSCULAR | Status: AC
  Administered 2020-10-31: 125 mg via INTRAVENOUS
  Filled 2020-10-31: qty 2

## 2020-10-31 MED ORDER — HYDROXYCHLOROQUINE SULFATE 200 MG PO TABS
200.0000 mg | ORAL_TABLET | Freq: Every day | ORAL | Status: DC
  Administered 2020-10-31 – 2020-11-01 (×2): 200 mg via ORAL
  Filled 2020-10-31 (×2): qty 1

## 2020-10-31 MED ORDER — ENOXAPARIN SODIUM 40 MG/0.4ML IJ SOSY
40.0000 mg | PREFILLED_SYRINGE | INTRAMUSCULAR | Status: DC
  Administered 2020-10-31 – 2020-11-01 (×2): 40 mg via SUBCUTANEOUS
  Filled 2020-10-31 (×2): qty 0.4

## 2020-10-31 MED ORDER — GUAIFENESIN-DM 100-10 MG/5ML PO SYRP
5.0000 mL | ORAL_SOLUTION | ORAL | Status: DC | PRN
  Administered 2020-11-01: 5 mL via ORAL
  Filled 2020-10-31: qty 5

## 2020-10-31 MED ORDER — SODIUM CHLORIDE 0.9 % IV SOLN
2.0000 g | Freq: Two times a day (BID) | INTRAVENOUS | Status: DC
  Administered 2020-10-31: 2 g via INTRAVENOUS
  Filled 2020-10-31 (×3): qty 2

## 2020-10-31 MED ORDER — AZITHROMYCIN 500 MG IV SOLR: 500.0000 mg | INTRAVENOUS | Status: DC

## 2020-10-31 MED ORDER — VANCOMYCIN HCL IN DEXTROSE 1-5 GM/200ML-% IV SOLN: 1000.0000 mg | INTRAVENOUS | Status: DC

## 2020-10-31 MED ORDER — ACETAMINOPHEN 650 MG RE SUPP
650.0000 mg | Freq: Four times a day (QID) | RECTAL | Status: DC | PRN
  Filled 2020-10-31: qty 1

## 2020-10-31 MED ORDER — IPRATROPIUM-ALBUTEROL 0.5-2.5 (3) MG/3ML IN SOLN: 3.0000 mL | RESPIRATORY_TRACT | Status: DC | PRN

## 2020-10-31 MED ORDER — IPRATROPIUM-ALBUTEROL 0.5-2.5 (3) MG/3ML IN SOLN
3.0000 mL | RESPIRATORY_TRACT | Status: DC | PRN
  Filled 2020-10-31: qty 3

## 2020-10-31 MED ORDER — METHYLPREDNISOLONE SODIUM SUCC 40 MG IJ SOLR
40.0000 mg | Freq: Two times a day (BID) | INTRAMUSCULAR | Status: DC
  Administered 2020-10-31: 40 mg via INTRAVENOUS
  Filled 2020-10-31: qty 1

## 2020-10-31 MED ORDER — ATORVASTATIN CALCIUM 20 MG PO TABS
20.0000 mg | ORAL_TABLET | Freq: Every day | ORAL | Status: DC
  Administered 2020-10-31 – 2020-11-01 (×2): 20 mg via ORAL
  Filled 2020-10-31 (×2): qty 1

## 2020-10-31 MED ORDER — ONDANSETRON HCL 4 MG PO TABS: 4.0000 mg | ORAL_TABLET | Freq: Four times a day (QID) | ORAL | Status: DC | PRN

## 2020-10-31 MED ORDER — LACTATED RINGERS IV SOLN: INTRAVENOUS | Status: DC

## 2020-10-31 MED ORDER — LEVOTHYROXINE SODIUM 100 MCG PO TABS
100.0000 ug | ORAL_TABLET | Freq: Every day | ORAL | Status: DC
  Administered 2020-11-01: 100 ug via ORAL
  Filled 2020-10-31: qty 1

## 2020-10-31 MED ORDER — ONDANSETRON HCL 4 MG/2ML IJ SOLN: 4.0000 mg | Freq: Four times a day (QID) | INTRAMUSCULAR | Status: DC | PRN

## 2020-10-31 MED ORDER — SODIUM CHLORIDE 0.9 % IV SOLN
1.0000 g | INTRAVENOUS | Status: DC
  Administered 2020-10-31 – 2020-11-01 (×2): 1 g via INTRAVENOUS
  Filled 2020-10-31: qty 1
  Filled 2020-10-31 (×2): qty 10

## 2020-10-31 NOTE — Progress Notes (Signed)
Pharmacy Antibiotic Note  Denise [name] Denise [name] is a 75 y.o. female admitted on 10/30/2020 with pneumonia.  Pharmacy has been consulted for Cefepime and Vancomycin dosing.  Plan: Cefepime 2 gm q12h per indication and renal fxn  Vancomycin Pt given initial dose of Vanc 1500 mg Vancomycin 1000 mg IV Q 24 hrs.  Goal AUC 400-550. Expected AUC: 518.2 SCr used: 0.87  Pharmacy will continue to follow and adjust abx dosing if warranted.   Height: 5\' 2"  (157.5 cm) Weight: 63.5 kg (140 lb) IBW/kg (Calculated) : 50.1  Temp (24hrs), Avg:100.8 F (38.2 C), Min:100.3 F (37.9 C), Max:101.3 F (38.5 C)  Recent Labs  Lab 10/30/20 2235  WBC 12.4*  CREATININE 0.87  LATICACIDVEN 1.0    Estimated Creatinine Clearance: 50.5 mL/min (by C-G formula based on SCr of 0.87 mg/dL).    No Known Allergies  Antimicrobials this admission: 10/20 Cefepime >>  10/20 Vancomycin >>   Microbiology results: 10/20 BCx: Pending  Thank you for allowing pharmacy to be a part of this patient's care.  11/20, PharmD, [facility] 10/31/2020 12:59 AM

## 2020-10-31 NOTE — Sepsis Progress Note (Signed)
Monitoring for the code sepsis protocol. °

## 2020-10-31 NOTE — ED Notes (Signed)
Maggie RN aware of assigned bed 

## 2020-10-31 NOTE — Progress Notes (Signed)
CODE SEPSIS - PHARMACY COMMUNICATION  **Broad Spectrum Antibiotics should be administered within 1 hour of Sepsis diagnosis**  Time Code Sepsis Called/Page Received: 0015  Antibiotics Ordered: Cefepime and Vancomycin  Time of 1st antibiotic administration: 0020  Otelia Sergeant, PharmD, [facility] 10/31/2020 12:15 AM

## 2020-10-31 NOTE — Progress Notes (Signed)
PROGRESS NOTE    Denise [name]  JIR:[id number] DOB: [birth year] DOA: 10/30/2020 PCP: Marya Fossa, PA-C    Brief Narrative:  Denise [name] was admitted to the hospital working diagnosis of sepsis due to community-acquired pneumonia.  75 year old female past medical history for pulmonary fibrosis with chronic bronchitis, chronic pulmonary hypertension, Sjogren's syndrome, hypothyroidism and hypertension who presented with shortness of breath cough and fever.  Reported 3-4 days of persistent symptoms, with no significant improvement.  On her initial physical examination she was febrile one 100.3, blood pressure 123/80, heart rate 95, respiratory 22, oxygen saturation 89% on room air.  She had dry mucous membranes, her lungs had coarse breath sounds bilaterally, diffuse rales and increased work of breathing, heart S1-S2, present, tachycardic, abdomen soft, no lower extremity edema.  Sodium 132, potassium 4.2, chloride 96, bicarb 25, glucose 106, BUN 12, creatinine 0.87, high sensitive troponin 8-8, white count 12.4, hemoglobin 13.6, hematocrit 40.9, platelets 306. SARS COVID-19 negative.  Chest radiograph with bilateral patchy infiltrates, predominantly lower lobes more left than right.  EKG 96 bpm, rightward axis, normal intervals, sinus rhythm, no significant ST segment T wave changes.  Assessment & Plan:   Principal Problem:   Sepsis due to pneumonia [facility]) Active Problems:   Acute on chronic respiratory failure with hypoxia (HCC)   Sjogren's disease (HCC)   Pulmonary fibrosis (HCC)   Hypothyroidism   Essential hypertension   Hyponatremia   Acute on chronic hypoxemic respiratory failure due to left lower lobe pneumonia, in the setting of pulmonary fibrosis.  Her dyspnea has improved but not back to baseline, her oxygenation is 99% on 3 L;/min per [place] ( at home uses 2 L/min),  Plan to continue antibiotic therapy, will change antibiotic regimen to ceftriaxone and  azithromycin  Follow up on blood cultures, cell count and temperature curve.  Continue with bronchodilator therapy, and antitussive agents.   2. HTN continue to hold on antihypertensive medications, blood pressure this am 128/62 mmHg  3. Connective tissue disease (scleroderma and Sjogren) . Continue patient with hydroxychloroquine   4. Hyponatremia renal function with serum cr at 0,87. K is 4,2 and serum bicarbonate at 25. Will discontinue IV fluids and follow renal panel in am.   5. Hypothyroid. Continue with levothyroxine.    Patient continue to be at high risk for worsening respiratory failure   Status is: Inpatient  Remains inpatient appropriate because: iv antibiotics and supplemental 02    DVT prophylaxis: Enoxaparin   Code Status:    full  Family Communication:   I spoke with patient's daughter at the bedside, we talked in detail about patient's condition, plan of care and prognosis and all questions were addressed.    Antimicrobials:  Ceftriaxone and a zithromycin     Subjective: Patient with improved dyspnea but not yet back to baseline, continue to have cough and congestion, no nausea or vomiting.   Objective: Vitals:   10/31/20 0700 10/31/20 0830 10/31/20 1130 10/31/20 1400  BP: 110/73 (!) 91/51 104/65 128/62  Pulse: 69 61 72 65  Resp: 13 20 19 20   Temp:  99.8 F (37.7 C)    TempSrc:  Oral    SpO2: 97% 96% 100% 99%  Weight:      Height:        Intake/Output Summary (Last 24 hours) at 10/31/2020 1458 Last data filed at 10/31/2020 0335 Gross per 24 hour  Intake 403.67 ml  Output --  Net 403.67 ml   11/02/2020  10/30/20 2221  Weight: 63.5 kg    Examination:   General: positive dyspnea, deconditioned  Neurology: Awake and alert, non focal  E ENT: no pallor, no icterus, oral mucosa moist Cardiovascular: No JVD. S1-S2 present, rhythmic, no gallops, rubs, or murmurs. No lower extremity edema. Pulmonary: breath sounds bilaterally,  with no  wheezing, or rhonchi diffuse bilateral rales. Gastrointestinal. Abdomen soft and non tender Skin. No rashes Musculoskeletal: no joint deformities     Data Reviewed: I have personally reviewed following labs and imaging studies  CBC: Recent Labs  [name] 10/30/20 2235  WBC 12.4*  NEUTROABS 8.6*  HGB 13.6  HCT 40.9  MCV 86.1  PLT 306   Basic Metabolic Panel: Recent Labs  [name] 10/30/20 2235  NA 132*  K 4.2  CL 96*  CO2 25  GLUCOSE 106*  BUN 12  CREATININE 0.87  CALCIUM 8.8*   GFR: Estimated Creatinine Clearance: 50.5 mL/min (by C-G formula based on SCr of 0.87 mg/dL). Liver Function Tests: Recent Labs  [name] 10/30/20 2235  AST 32  ALT 18  ALKPHOS 88  BILITOT 0.9  PROT 8.7*  ALBUMIN 3.6   No results for input(s): LIPASE, AMYLASE in the last 168 hours. No results for input(s): AMMONIA in the last 168 hours. Coagulation Profile: Recent Labs  [name] 10/31/20 0002  INR 1.2   Cardiac Enzymes: No results for input(s): CKTOTAL, CKMB, CKMBINDEX, TROPONINI in the last 168 hours. BNP (last 3 results) No results for input(s): PROBNP in the last 8760 hours. HbA1C: No results for input(s): HGBA1C in the last 72 hours. CBG: No results for input(s): GLUCAP in the last 168 hours. Lipid Profile: No results for input(s): CHOL, HDL, LDLCALC, TRIG, CHOLHDL, LDLDIRECT in the last 72 hours. Thyroid Function Tests: No results for input(s): TSH, T4TOTAL, FREET4, T3FREE, THYROIDAB in the last 72 hours. Anemia Panel: No results for input(s): VITAMINB12, FOLATE, FERRITIN, TIBC, IRON, RETICCTPCT in the last 72 hours.    Radiology Studies: I have reviewed all of the imaging during this hospital visit personally     Scheduled Meds:  enoxaparin (LOVENOX) injection  40 mg Subcutaneous Q24H   methylPREDNISolone (SOLU-MEDROL) injection  40 mg Intravenous Q12H   Continuous Infusions:  ceFEPime (MAXIPIME) IV Stopped (10/31/20 1200)   lactated ringers 125 mL/hr at 10/31/20 0900      LOS: 0 days        Sharne Linders Annett Gula, MD

## 2020-10-31 NOTE — Progress Notes (Addendum)
Pharmacy Antibiotic Note  Denise [name] Denise [name] is a 75 y.o. female with PMH of pulmonary fibrosis with chronic bronchitis, chronic pulmonary hypertension, Sjogren's syndrome, hypothyroidism, essential hypertension who was admitted on 10/30/2020 with pneumonia.  Pharmacy has been consulted for Cefepime dosing.  Febrile 101.3 on admin, afeb since, WBC 12.4  Bcx NG <12h  MRSA PCR not detected, vanc discontinued 10/20   Plan: Cefepime  -Will continue cefepime 2g IV q12h   Pharmacy will continue to follow and adjust abx dosing if warranted.   Height: 5\' 2"  (157.5 cm) Weight: 63.5 kg (140 lb) IBW/kg (Calculated) : 50.1  Temp (24hrs), Avg:100.5 F (38.1 C), Min:99.8 F (37.7 C), Max:101.3 F (38.5 C)  Recent Labs  Lab 10/30/20 2235  WBC 12.4*  CREATININE 0.87  LATICACIDVEN 1.0     Estimated Creatinine Clearance: 50.5 mL/min (by C-G formula based on SCr of 0.87 mg/dL).    No Known Allergies  Antimicrobials this admission: Ongoing 10/20 Cefepime >>   Completed 10/20 Vancomycin >> 10/20 x1 ED  Microbiology results: 10/20 BCx: NG <12h 10/20 MRSA PCR: not detected   Thank you for allowing pharmacy to be a part of this patient's care.  11/20, PharmD Pharmacy Resident  10/31/2020 2:36 PM

## 2020-10-31 NOTE — Progress Notes (Signed)
*  PRELIMINARY RESULTS* Echocardiogram 2D Echocardiogram has been performed.  Cristela Blue 10/31/2020, 10:01 AM

## 2020-10-31 NOTE — H&P (Signed)
History and Physical   Denise [name] WJX:[id number] DOB: 12-19-1945 DOA: 10/30/2020  Referring MD/NP/PA: Dr. Antoine Primas  PCP: Marya Fossa, PA-C   Outpatient Specialists: Cardiology clinic pulmonology or rheumatology  Patient coming from: Home  Chief Complaint: Shortness of breath  HPI: Denise [name] is a 75 y.o. female with medical history significant of pulmonary fibrosis with chronic bronchitis, chronic pulmonary hypertension, Sjogren's syndrome, hypothyroidism, essential hypertension who presents to the ER with progressive shortness of breath cough and fever.  This is associated with headaches.  Also chest pain.  Patient has been having symptoms for at least 3 to 4 days.  She has taken her home regimen but not improving.  She arrived the ER where she was found to be hypoxic but also febrile with leukocytosis.  She meets sepsis criteria with chest x-ray suggestive of diffuse infiltrates possibly diffuse pneumonia.  She was COVID-19 negative.  Patient is therefore being admitted was sepsis due to pneumonia.  She has also suggestive of possible cardiomegaly with vascular congestion.  No prior history of CHF.Marland Kitchen  ED Course: Temperature 101.3, blood pressure is 123/80, pulse 95 respiratory 22 and oxygen sat 89% on room air.  White count 12.4 the rest of the CBC within normal.  Sodium 132 potassium 4.2 chloride 96 CO2 25 BUN 12 creatinine 0.87 calcium 8.8.  Glucose 106 and lactic acid 1.0.  COVID-19 and influenza screen negative.  EKG showed normal sinus rhythm.  Chest x-ray shows cardiomegaly with diffuse bilateral airspace disease.  It appears to have progressed since prior study and may be a combination of fibrosis and edema.  Patient being admitted for further evaluation and treatment.  Review of Systems: As per HPI otherwise 10 point review of systems negative.    Past Medical History:  Diagnosis Date   Hypertension    Pulmonary fibrosis (HCC)    Pulmonary  hypertension (HCC)    a. TTE 10/19:  EF of 65-70%, mild LVH, no RWMA, normal LV diastolic function, mildly dilated LA, RVSF cavity size normal with normal RVSF, mildly dilated RA, PASP 45 mmHg, dilated IVC consistent with elevated CVP, trivial pericardial effusion was noted   Sjogren's disease (HCC)    Thyroid disease     No past surgical history on file.   reports that she has quit smoking. She has never used smokeless tobacco. She reports that she does not drink alcohol and does not use drugs.  No Known Allergies  No family history on file.   Prior to Admission medications   Medication Sig Start Date End Date Taking? Authorizing Provider  atorvastatin (LIPITOR) 20 MG tablet Take 20 mg by mouth daily.    [provider]  guaiFENesin-codeine 100-10 MG/5ML syrup Take 5 mLs by mouth every 4 (four) hours as needed for cough. 12/07/17   Erin Fulling, MD  hydroxychloroquine (PLAQUENIL) 200 MG tablet Take 1 tablet (200 mg total) by mouth daily. 11/09/17   Katha Hamming, MD  levothyroxine (SYNTHROID, LEVOTHROID) 100 MCG tablet Take 1 tablet (100 mcg total) by mouth daily at 6 (six) AM. 11/09/17   Katha Hamming, MD  naproxen (NAPROSYN) 500 MG tablet Take 1 tablet (500 mg total) by mouth 2 (two) times daily with a meal. 11/08/17   Katha Hamming, MD  pilocarpine (SALAGEN) 5 MG tablet Take 5 mg by mouth 3 (three) times daily.    [provider]  predniSONE (STERAPRED UNI-PAK 21 TAB) 10 MG (21) TBPK tablet Start 60 mg po daily,  taper 10 mg daily until finish 09/02/20   Delfino Lovett, MD  Respiratory Therapy Supplies (FLUTTER) DEVI 1 each by Does not apply route 4 (four) times daily. 12/07/17   Erin Fulling, MD    Physical Exam: Vitals:   10/30/20 2221 10/30/20 2224 10/30/20 2309 10/30/20 2324  BP:  115/74  123/80  Pulse:  95  89  Resp:  (!) 22  12  Temp:  (!) 101.3 F (38.5 C) 100.3 F (37.9 C)   TempSrc:  Oral Oral   SpO2:  (!) 89%  97%  Weight: 63.5 kg      Height: 5\' 2"  (1.575 m)         Constitutional: Acutely ill looking in obvious distress Vitals:   10/30/20 2221 10/30/20 2224 10/30/20 2309 10/30/20 2324  BP:  115/74  123/80  Pulse:  95  89  Resp:  (!) 22  12  Temp:  (!) 101.3 F (38.5 C) 100.3 F (37.9 C)   TempSrc:  Oral Oral   SpO2:  (!) 89%  97%  Weight: 63.5 kg     Height: 5\' 2"  (1.575 m)      Eyes: PERRL, lids and conjunctivae normal ENMT: Mucous membranes are dry posterior pharynx clear of any exudate or lesions.Normal dentition.  Neck: normal, supple, no masses, no thyromegaly Respiratory: Coarse breath sound bilaterally with diffuse crackles, increased respiratory effort. No accessory muscle use.  Cardiovascular: Sinus tachycardia, no murmurs / rubs / gallops. No extremity edema. 2+ pedal pulses. No carotid bruits.  Abdomen: no tenderness, no masses palpated. No hepatosplenomegaly. Bowel sounds positive.  Musculoskeletal: no clubbing / cyanosis. No joint deformity upper and lower extremities. Good ROM, no contractures. Normal muscle tone.  Skin: no rashes, lesions, ulcers. No induration Neurologic: CN 2-12 grossly intact. Sensation intact, DTR normal. Strength 5/5 in all 4.  Psychiatric: Normal judgment and insight. Alert and oriented x 3. Normal mood.     Labs on Admission: I have personally reviewed following labs and imaging studies  CBC: Recent Labs  Lab 10/30/20 2235  WBC 12.4*  NEUTROABS 8.6*  HGB 13.6  HCT 40.9  MCV 86.1  PLT 306   Basic Metabolic Panel: Recent Labs  Lab 10/30/20 2235  NA 132*  K 4.2  CL 96*  CO2 25  GLUCOSE 106*  BUN 12  CREATININE 0.87  CALCIUM 8.8*   GFR: Estimated Creatinine Clearance: 50.5 mL/min (by C-G formula based on SCr of 0.87 mg/dL). Liver Function Tests: Recent Labs  Lab 10/30/20 2235  AST 32  ALT 18  ALKPHOS 88  BILITOT 0.9  PROT 8.7*  ALBUMIN 3.6   No results for input(s): LIPASE, AMYLASE in the last 168 hours. No results for input(s): AMMONIA  in the last 168 hours. Coagulation Profile: No results for input(s): INR, PROTIME in the last 168 hours. Cardiac Enzymes: No results for input(s): CKTOTAL, CKMB, CKMBINDEX, TROPONINI in the last 168 hours. BNP (last 3 results) No results for input(s): PROBNP in the last 8760 hours. HbA1C: No results for input(s): HGBA1C in the last 72 hours. CBG: No results for input(s): GLUCAP in the last 168 hours. Lipid Profile: No results for input(s): CHOL, HDL, LDLCALC, TRIG, CHOLHDL, LDLDIRECT in the last 72 hours. Thyroid Function Tests: No results for input(s): TSH, T4TOTAL, FREET4, T3FREE, THYROIDAB in the last 72 hours. Anemia Panel: No results for input(s): VITAMINB12, FOLATE, FERRITIN, TIBC, IRON, RETICCTPCT in the last 72 hours. Urine analysis:    Component Value Date/Time   COLORURINE YELLOW (  A) 11/07/2017 0704   APPEARANCEUR CLEAR (A) 11/07/2017 0704   LABSPEC >1.046 (H) 11/07/2017 0704   PHURINE 5.0 11/07/2017 0704   GLUCOSEU NEGATIVE 11/07/2017 0704   HGBUR NEGATIVE 11/07/2017 0704   BILIRUBINUR NEGATIVE 11/07/2017 0704   KETONESUR NEGATIVE 11/07/2017 0704   PROTEINUR NEGATIVE 11/07/2017 0704   NITRITE NEGATIVE 11/07/2017 0704   LEUKOCYTESUR SMALL (A) 11/07/2017 0704   Sepsis Labs: @LABRCNTIP (procalcitonin:4,lacticidven:4) ) Recent Results (from the past 240 hour(s))  SARS CORONAVIRUS 2 (TAT 6-24 HRS) Nasopharyngeal Nasopharyngeal Swab     Status: None   Collection Time: 10/23/20  8:52 AM   Specimen: Nasopharyngeal Swab  Result Value Ref Range Status   SARS Coronavirus 2 NEGATIVE NEGATIVE Final    Comment: (NOTE) SARS-CoV-2 target nucleic acids are NOT DETECTED.  The SARS-CoV-2 RNA is generally detectable in upper and lower respiratory specimens during the acute phase of infection. Negative results do not preclude SARS-CoV-2 infection, do not rule out co-infections with other pathogens, and should not be used as the sole basis for treatment or other patient management  decisions. Negative results must be combined with clinical observations, patient history, and epidemiological information. The expected result is Negative.  Fact Sheet for Patients: 12/23/20  Fact Sheet for Healthcare Providers: HairSlick.no  This test is not yet approved or cleared by the quierodirigir.com FDA and  has been authorized for detection and/or diagnosis of SARS-CoV-2 by FDA under an Emergency Use Authorization (EUA). This EUA will remain  in effect (meaning this test can be used) for the duration of the COVID-19 declaration under Se ction 564(b)(1) of the Act, 21 U.S.C. section 360bbb-3(b)(1), unless the authorization is terminated or revoked sooner.  Performed at [facility], [address], [place], [place]   Resp Panel by RT-PCR (Flu A&B, Covid) Nasopharyngeal Swab     Status: None   Collection Time: 10/30/20 10:35 PM   Specimen: Nasopharyngeal Swab; Nasopharyngeal(NP) swabs in vial transport medium  Result Value Ref Range Status   SARS Coronavirus 2 by RT PCR NEGATIVE NEGATIVE Final    Comment: (NOTE) SARS-CoV-2 target nucleic acids are NOT DETECTED.  The SARS-CoV-2 RNA is generally detectable in upper respiratory specimens during the acute phase of infection. The lowest concentration of SARS-CoV-2 viral copies this assay can detect is 138 copies/mL. A negative result does not preclude SARS-Cov-2 infection and should not be used as the sole basis for treatment or other patient management decisions. A negative result may occur with  improper specimen collection/handling, submission of specimen other than nasopharyngeal swab, presence of viral mutation(s) within the areas targeted by this assay, and inadequate number of viral copies(<138 copies/mL). A negative result must be combined with clinical observations, patient history, and epidemiological information. The expected result is  Negative.  Fact Sheet for Patients:  11/01/20  Fact Sheet for Healthcare Providers:  BloggerCourse.com  This test is no t yet approved or cleared by the [employer]iousBroker.[employer] FDA and  has been authorized for detection and/or diagnosis of SARS-CoV-2 by FDA under an Emergency Use Authorization (EUA). This EUA will remain  in effect (meaning this test can be used) for the duration of the COVID-19 declaration under Section 564(b)(1) of the Act, 21 U.S.C.section 360bbb-3(b)(1), unless the authorization is terminated  or revoked sooner.       Influenza A by PCR NEGATIVE NEGATIVE Final   Influenza B by PCR NEGATIVE NEGATIVE Final    Comment: (NOTE) The Xpert Xpress SARS-CoV-2/FLU/RSV plus assay is intended as an  aid in the diagnosis of influenza from Nasopharyngeal swab specimens and should not be used as a sole basis for treatment. Nasal washings and aspirates are unacceptable for Xpert Xpress SARS-CoV-2/FLU/RSV testing.  Fact Sheet for Patients: BloggerCourse.com  Fact Sheet for Healthcare Providers: SeriousBroker.it  This test is not yet approved or cleared by the [place] FDA and has been authorized for detection and/or diagnosis of SARS-CoV-2 by FDA under an Emergency Use Authorization (EUA). This EUA will remain in effect (meaning this test can be used) for the duration of the COVID-19 declaration under Section 564(b)(1) of the Act, 21 U.S.C. section 360bbb-3(b)(1), unless the authorization is terminated or revoked.  Performed at [facility], [address] [place], [place] [address]      Radiological Exams on Admission: DG Chest Portable 1 View  Result Date: 10/30/2020 CLINICAL DATA:  Shortness of breath, fever EXAM: PORTABLE CHEST 1 VIEW COMPARISON:  08/31/2020 FINDINGS: Cardiomegaly. Diffuse bilateral airspace disease, worsening since prior study. No  visible effusions. Aortic atherosclerosis. No acute bony abnormality. IMPRESSION: Cardiomegaly with diffuse bilateral airspace disease. This appears progressed since prior study and may reflect a combination of fibrosis and edema. Electronically Signed   By: Charlett Nose M.D.   On: 10/30/2020 23:39    EKG: Independently reviewed.  Showed normal sinus rhythm no significant findings  Assessment/Plan Principal Problem:   Sepsis due to pneumonia [facility]) Active Problems:   Acute on chronic respiratory failure with hypoxia (HCC)   Sjogren's disease (HCC)   Pulmonary fibrosis (HCC)   Hypothyroidism   Essential hypertension   Hyponatremia     #1 sepsis due to pneumonia: Patient will be admitted and initiated on treatment for community-acquired pneumonia.  She was in the hospital back in August which is less than 3 months.  She therefore could qualify for HCAP treatment.  We will obtain blood cultures and respiratory cultures as needed.  Has symptoms may have been worsened by her history of pulmonary hypertension and pulmonary fibrosis.  #2 pulmonary fibrosis and hypertension: Patient reports chronic respiratory failure.  Patient has posted on supplemental oxygen at 2 L/min but not using it as she should.  She is now requiring 3 L in the ER.  She has significant connective tissue disease including scleroderma.  She is followed by rheumatology.  At this point we will continue with oxygenation and treatment for acute events.  #3 acute on chronic respiratory failure: Secondary to pneumonia and pulmonary fibrosis.  Continue as probable  #4 essential hypertension: Resume home regimen on monitor.  #5 connective tissue disease: Sjogren's disease with some scleroderma.  Continue supportive care plan rheumatology  #6 hyponatremia: Hydrate patient and monitor.  #7 hypothyroidism: Continue with levothyroxine  #8 possible CHF: Suspected diastolic CHF.  Patient's last echo was in 2019.  We will obtain new  echocardiogram.  Still appears intravascularly dry so we will give fluids.  We will do gentle fluids for sepsis protocol.    DVT prophylaxis: Lovenox Code Status: Full code Family Communication: No family at bedside Disposition Plan: Home Consults called: None Admission status: Inpatient  Severity of Illness: The appropriate patient status for this patient is INPATIENT. Inpatient status is judged to be reasonable and necessary in order to provide the required intensity of service to ensure the patient's safety. The patient's presenting symptoms, physical exam findings, and initial radiographic and laboratory data in the context of their chronic comorbidities is felt to place them at high risk for further clinical deterioration. Furthermore, it is  not anticipated that the patient will be medically stable for discharge from the hospital within 2 midnights of admission.   * I certify that at the point of admission it is my clinical judgment that the patient will require inpatient hospital care spanning beyond 2 midnights from the point of admission due to high intensity of service, high risk for further deterioration and high frequency of surveillance required.Lonia Blood MD [facility] Pager [phone]  If 7PM-7AM, please contact night-coverage [email or web address] Password [email or web address]  10/31/2020, 12:30 AM

## 2020-11-01 DIAGNOSIS — J189 Pneumonia, unspecified organism: Secondary | ICD-10-CM | POA: Diagnosis present

## 2020-11-01 LAB — BASIC METABOLIC PANEL
Anion gap: 8 (ref 5–15)
BUN: 22 mg/dL (ref 8–23)
CO2: 25 mmol/L (ref 22–32)
Calcium: 8.8 mg/dL — ABNORMAL LOW (ref 8.9–10.3)
Chloride: 103 mmol/L (ref 98–111)
Creatinine, Ser: 0.93 mg/dL (ref 0.44–1.00)
GFR, Estimated: >60 mL/min (ref >60)
Glucose, Bld: 96 mg/dL (ref 70–99)
Potassium: 3.6 mmol/L (ref 3.5–5.1)
Sodium: 136 mmol/L (ref 135–145)

## 2020-11-01 LAB — CORTISOL-AM, BLOOD: Cortisol - AM: 1 ug/dL — ABNORMAL LOW (ref 6.7–22.6)

## 2020-11-01 MED ORDER — GUAIFENESIN-CODEINE 100-10 MG/5ML PO SOLN: 5.0000 mL | ORAL | 0 refills | Status: AC | PRN

## 2020-11-01 MED ORDER — AZITHROMYCIN 500 MG PO TABS: 500.0000 mg | ORAL_TABLET | Freq: Every day | ORAL | 0 refills | Status: AC

## 2020-11-01 MED ORDER — AMOXICILLIN-POT CLAVULANATE 875-125 MG PO TABS: 1.0000 | ORAL_TABLET | Freq: Two times a day (BID) | ORAL | 0 refills | Status: AC

## 2020-11-01 MED ORDER — BENZONATATE 100 MG PO CAPS: 100.0000 mg | ORAL_CAPSULE | Freq: Four times a day (QID) | ORAL | 0 refills | Status: AC | PRN

## 2020-11-01 NOTE — Evaluation (Signed)
Occupational Therapy Evaluation Patient Details Name: Denise [name] MRN: [id number] DOB: 02/05/1945 Today's Date: 11/01/2020   History of Present Illness 75 y.o. female admitted on 10/30/2020 with pneumonia.   Clinical Impression    Upon entering the room, pt in flat bed with daughter present in the room. Daughter requests to interpret for pt during evaluation. Pt lives with niece and several family members are in and out of the home during the day to assist as needed. Pt does not use AD for ambulation or self care tasks at baseline. She is on 2 L via [place] at home. Pt demonstrating ability to perform bed mobility independently and dons B shoes. Pt ambulates to bathroom and performs toilet transfer and hygiene without assistance. Pt's daughter present in the room reports pt appears at baseline. OT to SIGN OFF secondary to pt with no acute OT needs.    Recommendations for follow up therapy are one component of a multi-disciplinary discharge planning process, led by the attending physician.  Recommendations may be updated based on patient status, additional functional criteria and insurance authorization.   Follow Up Recommendations  No OT follow up    Equipment Recommendations  None recommended by OT       Precautions / Restrictions Precautions Precautions: Fall Precaution Comments: mod fall risk      Mobility Bed Mobility Overal bed mobility: Independent                  Transfers Overall transfer level: Independent                    Balance Overall balance assessment: Independent                                         ADL either performed or assessed with clinical judgement   ADL Overall ADL's : At baseline;Independent                                             Vision Patient Visual Report: No change from baseline              Pertinent Vitals/Pain Pain Assessment: No/denies pain     Hand  Dominance Right   Extremity/Trunk Assessment Upper Extremity Assessment Upper Extremity Assessment: Overall WFL for tasks assessed   Lower Extremity Assessment Lower Extremity Assessment: Overall WFL for tasks assessed       Communication Communication Communication: Prefers language other than English (spanish)   Cognition Arousal/Alertness: Awake/alert Behavior During Therapy: WFL for tasks assessed/performed Overall Cognitive Status: Within Functional Limits for tasks assessed                                 General Comments: Pt A & O x 4 and very pleasant              Home Living Family/patient expects to be discharged to:: Private residence Living Arrangements: Children Available Help at Discharge: Family;Available PRN/intermittently Type of Home: House Home Access: Stairs to enter [employer] of Steps: threshold   Home Layout: One level     Bathroom Shower/Tub: Runner, broadcasting/film/video: None  Prior Functioning/Environment Level of Independence: Independent                          OT Goals(Current goals can be found in the care plan section) Acute Rehab OT Goals Patient Stated Goal: to return home OT Goal Formulation: With patient/family Time For Goal Achievement: 11/01/20 Potential to Achieve Goals: Fair   AM-PAC OT "6 Clicks" Daily Activity     Outcome Measure Help from another person eating meals?: None Help from another person taking care of personal grooming?: None Help from another person toileting, which includes using toliet, bedpan, or urinal?: None Help from another person bathing (including washing, rinsing, drying)?: None Help from another person to put on and taking off regular upper body clothing?: None Help from another person to put on and taking off regular lower body clothing?: None 6 Click Score: 24   End of Session Equipment Utilized During Treatment: Oxygen Nurse  Communication: Mobility status  Activity Tolerance: Patient tolerated treatment well Patient left: in bed;with call bell/phone within reach;with family/visitor present                   Time: 1008-1030 OT Time Calculation (min): 22 min Charges:  OT General Charges $OT Visit: 1 Visit OT Evaluation $OT Eval Low Complexity: 1 Low OT Treatments $Self Care/Home Management : 8-22 mins  Jackquline Denmark, MS, OTR/L , CBIS ascom [phone]  11/01/20, 11:32 AM

## 2020-11-01 NOTE — Assessment & Plan Note (Signed)
Note an incidental cortisol level was noted to be low.  She had no symptoms here of clinically significant adrenal insufficiency. - Follow up with Rheumatology

## 2020-11-01 NOTE — Discharge Summary (Signed)
Physician Discharge Summary   Patient name: Denise [name]  Admit date:     10/30/2020  Discharge date: 11/01/2020  Discharge Physician: Alberteen Sam   PCP: Marya Fossa, PA-C   Recommendations at discharge:  Follow up with PCP in 1 week Follow up with Pulmonology, Dr. Belia Heman as previously scheduled Follow up low cortisol level with Rheumatology Dr. Gavin Potters: Please see reduced morning cortisol level noted incidentally here; patient without symptoms of clinically significant adrenal insufficiency in the hospital      Discharge Diagnoses Principal Problem:   Bilateral pneumonia Active Problems:   Acute on chronic respiratory failure with hypoxia (HCC)   Pulmonary fibrosis (HCC)   Sjogren's disease (HCC)   Essential hypertension   Hyponatremia   Hypothyroidism         Hospital Course   Mrs. Denise [name] is a 75 y.o. F with pulmonary fibrosis followed by Dr. Belia Heman, pHTN, Sjogren's syndrome, hypothyroidism, and HTN who presented with SOB, cough and fever.  Patient started to have fever and cough 3-4 days prior to admission.  This progressed, became worse, and so she came to the ER.  In th ER, SpO2 89% on her home O2, respiratory rate 19-30 over several hours.  CXR showed bilateral infiltrates.  She was admitted, given antibiotics.       * Bilateral pneumonia HR and RR elevated on admission, likely due to respiratory failure, sepsis ruled out.  Treated with broad spectrum antibiotics at first, narrowed to ceftriaxone and azithromycin.  Clinically improved.  Patient now mentating at baseline, taking orals.  Temp < 100 F, heart rate < 100bpm, RR < 24, ambulated around unit with SpO2 normal on baseline O2.   Stable for discharge to complete 5 more days Augmentin and azithromycin.  Pulmonary fibrosis (HCC)    Acute on chronic respiratory failure with hypoxia (HCC) Presented with tachycpnea, dyspnea and SpO2 <90% on home  O2.  Hyponatremia Resolved  Essential hypertension     Sjogren's disease (HCC) Note an incidental cortisol level was noted to be low.  She had no symptoms here of clinically significant adrenal insufficiency. - Follow up with Rheumatology  Hypothyroidism    Pulmonary hypertension Echo here with grade I DD, normal EF.  No clinical signs of heart failure and improved with antibiotics, without diruesis.          Condition at discharge: good  Exam Physical Exam Constitutional:      General: She is not in acute distress.    Appearance: Normal appearance. She is not toxic-appearing.  Cardiovascular:     Rate and Rhythm: Normal rate and regular rhythm.     Heart sounds: No murmur heard.   No gallop.     Comments: Loud S2 Pulmonary:     Effort: Pulmonary effort is normal.     Breath sounds: Normal breath sounds. No wheezing or rales.  Abdominal:     General: Abdomen is flat.     Palpations: Abdomen is soft.     Tenderness: There is no abdominal tenderness. There is no guarding.  Musculoskeletal:        General: No swelling.     Right lower leg: No edema.     Left lower leg: No edema.  Skin:    General: Skin is warm and dry.     Findings: No rash.  Neurological:     General: No focal deficit present.     Mental Status: She is alert and oriented to person, place, and  time. Mental status is at baseline.     Motor: No weakness.  Psychiatric:        Mood and Affect: Mood normal.        Behavior: Behavior normal.        Thought Content: Thought content normal.        Judgment: Judgment normal.      Disposition: Home  Discharge time: greater than 30 minutes.  Follow-up Information     Marya Fossa, PA-C. Schedule an appointment as soon as possible for a visit in 1 week(s).   Specialty: Physician Assistant Why: no one picked up and couldnt leave a message Contact information: [address] [place] [address] [phone]         Erin Fulling, MD Follow up.   Specialties: Pulmonary Disease, Cardiology Contact information: [address] [place] [address] [phone]                 Allergies as of 11/01/2020   No Known Allergies      Medication List     STOP taking these medications    acidophilus Caps capsule   amLODipine 2.5 MG tablet Commonly known as: NORVASC   naproxen 500 MG tablet Commonly known as: NAPROSYN   predniSONE 10 MG (21) Tbpk tablet Commonly known as: STERAPRED UNI-PAK 21 TAB       TAKE these medications    amoxicillin-clavulanate 875-125 MG tablet Commonly known as: Augmentin Take 1 tablet by mouth 2 (two) times daily for 5 days.   atorvastatin 20 MG tablet Commonly known as: LIPITOR Take 20 mg by mouth daily.   azithromycin 500 MG tablet Commonly known as: Zithromax Take 1 tablet (500 mg total) by mouth daily for 3 days. Take 1 tablet daily for 3 days.   benzonatate 100 MG capsule Commonly known as: Tessalon Perles Take 1 capsule (100 mg total) by mouth every 6 (six) hours as needed for cough.   Flutter Devi 1 each by Does not apply route 4 (four) times daily.   guaiFENesin-codeine 100-10 MG/5ML syrup Take 5 mLs by mouth every 4 (four) hours as needed for cough.   hydroxychloroquine 200 MG tablet Commonly known as: PLAQUENIL Take 1 tablet (200 mg total) by mouth daily.   levothyroxine 100 MCG tablet Commonly known as: SYNTHROID Take 1 tablet (100 mcg total) by mouth daily at 6 (six) AM.   pilocarpine 5 MG tablet Commonly known as: SALAGEN Take 5 mg by mouth 3 (three) times daily.   senna-docusate 8.6-50 MG tablet Commonly known as: Senokot-S Take 1 tablet by mouth at bedtime.               Durable Medical Equipment  (From admission, onward)           Start     Ordered   11/01/20 1238  DME Oxygen  Once       Question Answer Comment  Length of Need Lifetime   Mode or (Route) Nasal cannula   Liters per Minute 2    Frequency Continuous (stationary and portable oxygen unit needed)   Oxygen delivery system Gas      11/01/20 1239            DG Chest Portable 1 View  Result Date: 10/30/2020 CLINICAL DATA:  Shortness of breath, fever EXAM: PORTABLE CHEST 1 VIEW COMPARISON:  08/31/2020 FINDINGS: Cardiomegaly. Diffuse bilateral airspace disease, worsening since prior study. No visible effusions. Aortic atherosclerosis. No acute  bony abnormality. IMPRESSION: Cardiomegaly with diffuse bilateral airspace disease. This appears progressed since prior study and may reflect a combination of fibrosis and edema. Electronically Signed   By: Charlett Nose M.D.   On: 10/30/2020 23:39   ECHOCARDIOGRAM COMPLETE  Result Date: 10/31/2020    ECHOCARDIOGRAM REPORT   Patient Name:   WEN MUNFORD DE Kathrene Alu Date of Exam: 10/31/2020 Medical Rec #:  [id number]                    Height:       62.0 in Accession #:    [id number]                   Weight:       140.0 lb Date of Birth:  May 26, 1945                     BSA:          1.643 m Patient Age:    75 years                     BP:           91/51 mmHg Patient Gender: F                            HR:           61 bpm. Exam Location:  [facility] Procedure: 2D Echo, Cardiac Doppler and Color Doppler Indications:     Dyspnea R06.00  History:         Patient has prior history of Echocardiogram examinations, most                  recent 11/07/2017. Pulmonary HTN; Risk Factors:Hypertension.  Sonographer:     Cristela Blue Referring Phys:  [id number] Rometta Emery Diagnosing Phys: Julien Nordmann MD  Sonographer Comments: Suboptimal apical window. IMPRESSIONS  1. Left ventricular ejection fraction, by estimation, is 60 to 65%. The left ventricle has normal function. The left ventricle has no regional wall motion abnormalities. Left ventricular diastolic parameters are consistent with Grade I diastolic dysfunction (impaired relaxation).  2. Right ventricular systolic function is normal. The right  ventricular size is mildly enlarged. There is moderately elevated pulmonary artery systolic pressure. The estimated right ventricular systolic pressure is 55.7 mmHg.  3. Left atrial size was moderately dilated.  4. The mitral valve is normal in structure. Mild mitral valve regurgitation. No evidence of mitral stenosis.  5. Tricuspid valve regurgitation is mild to moderate.  6. The aortic valve is normal in structure. Aortic valve regurgitation is not visualized. Mild to moderate aortic valve sclerosis/calcification is present, without any evidence of aortic stenosis.  7. The inferior vena cava is normal in size with greater than 50% respiratory variability, suggesting right atrial pressure of 3 mmHg.  8. Challenging images FINDINGS  Left Ventricle: Left ventricular ejection fraction, by estimation, is 60 to 65%. The left ventricle has normal function. The left ventricle has no regional wall motion abnormalities. The left ventricular internal cavity size was normal in size. There is  no left ventricular hypertrophy. Left ventricular diastolic parameters are consistent with Grade I diastolic dysfunction (impaired relaxation). Right Ventricle: The right ventricular size is mildly enlarged. No increase in right ventricular wall thickness. Right ventricular systolic function is normal. There is moderately elevated pulmonary artery systolic pressure. The tricuspid regurgitant velocity is 3.56 m/s, and  with an assumed right atrial pressure of 5 mmHg, the estimated right ventricular systolic pressure is 55.7 mmHg. Left Atrium: Left atrial size was moderately dilated. Right Atrium: Right atrial size was normal in size. Pericardium: There is no evidence of pericardial effusion. Mitral Valve: The mitral valve is normal in structure. Mild mitral valve regurgitation. No evidence of mitral valve stenosis. Tricuspid Valve: The tricuspid valve is normal in structure. Tricuspid valve regurgitation is mild to moderate. No evidence of  tricuspid stenosis. Aortic Valve: The aortic valve is normal in structure. Aortic valve regurgitation is not visualized. Mild to moderate aortic valve sclerosis/calcification is present, without any evidence of aortic stenosis. Aortic valve mean gradient measures 5.5 mmHg. Aortic valve peak gradient measures 9.0 mmHg. Aortic valve area, by VTI measures 2.17 cm. Pulmonic Valve: The pulmonic valve was normal in structure. Pulmonic valve regurgitation is not visualized. No evidence of pulmonic stenosis. Aorta: The aortic root is normal in size and structure. Venous: The inferior vena cava is normal in size with greater than 50% respiratory variability, suggesting right atrial pressure of 3 mmHg. IAS/Shunts: No atrial level shunt detected by color flow Doppler.  LEFT VENTRICLE PLAX 2D LVIDd:         4.00 cm   Diastology LVIDs:         2.30 cm   LV e' medial:    5.44 cm/s LV PW:         1.10 cm   LV E/e' medial:  12.7 LV IVS:        0.75 cm   LV e' lateral:   8.81 cm/s LVOT diam:     2.00 cm   LV E/e' lateral: 7.8 LV SV:         70 LV SV Index:   43 LVOT Area:     3.14 cm  RIGHT VENTRICLE RV Basal diam:  4.10 cm RV S prime:     11.70 cm/s TAPSE (M-mode): 3.3 cm LEFT ATRIUM             Index        RIGHT ATRIUM           Index LA diam:        3.00 cm 1.83 cm/m   RA Area:     22.70 cm LA Vol (A2C):   87.8 ml 53.45 ml/m  RA Volume:   73.90 ml  44.98 ml/m LA Vol (A4C):   56.8 ml 34.58 ml/m LA Biplane Vol: 73.2 ml 44.56 ml/m  AORTIC VALVE                     PULMONIC VALVE AV Area (Vmax):    1.68 cm      PV Vmax:        0.56 m/s AV Area (Vmean):   1.73 cm      PV Peak grad:   1.3 mmHg AV Area (VTI):     2.17 cm      RVOT Peak grad: 1 mmHg AV Vmax:           150.00 cm/s AV Vmean:          105.500 cm/s AV VTI:            0.324 m AV Peak Grad:      9.0 mmHg AV Mean Grad:      5.5 mmHg LVOT Vmax:         80.30 cm/s LVOT Vmean:  58.100 cm/s LVOT VTI:          0.224 m LVOT/AV VTI ratio: 0.69  AORTA Ao Root diam:  3.10 cm MITRAL VALVE               TRICUSPID VALVE MV Area (PHT): 4.17 cm    TR Peak grad:   50.7 mmHg MV Decel Time: 182 msec    TR Vmax:        356.00 cm/s MV E velocity: 69.00 cm/s MV A velocity: 97.30 cm/s  SHUNTS MV E/A ratio:  0.71        Systemic VTI:  0.22 m                            Systemic Diam: 2.00 cm Julien Nordmann MD Electronically signed by Julien Nordmann MD Signature Date/Time: 10/31/2020/8:59:26 PM    Final (Updated)    Pulmonary Function Test [facility] Only  Result Date: 10/28/2020 The pt had difficulty with the pleth and DLCO even with repeated attempts. The DLCO was attempted 3 times. I could tell that the pt. was getting tired. so no more attempts were tried. 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  The SARS-CoV-2 RNA is generally detectable in upper respiratory specimens during the acute phase of infection. The lowest concentration of SARS-CoV-2 viral copies this assay can detect is 138 copies/mL. A negative result does not preclude SARS-Cov-2 infection and should not be used as the sole basis for treatment or other patient management decisions. A negative result may occur with  improper specimen collection/handling, submission of specimen other than nasopharyngeal swab, presence of viral mutation(s) within the areas targeted by this assay, and inadequate number of viral copies(<138  copies/mL). A negative result must be combined with clinical observations, patient history, and epidemiological information. The expected result is Negative.  Fact Sheet for Patients:  BloggerCourse.com  Fact Sheet for Healthcare Providers:  SeriousBroker.it  This test is no t yet approved or cleared by the [place] FDA and  has been authorized for detection and/or diagnosis of SARS-CoV-2 by FDA under an Emergency Use Authorization (EUA). This EUA will remain  in effect (meaning this test can be used) for the duration of the COVID-19 declaration under Section 564(b)(1) of the Act, 21 U.S.C.section 360bbb-3(b)(1), unless the authorization is terminated  or revoked sooner.       Influenza A by PCR NEGATIVE NEGATIVE Final   Influenza B by PCR NEGATIVE NEGATIVE Final    Comment: (NOTE) The Xpert Xpress SARS-CoV-2/FLU/RSV plus assay is intended as an aid in the diagnosis of influenza from Nasopharyngeal swab specimens and should not be used as a sole basis for treatment. Nasal washings and aspirates are unacceptable for Xpert Xpress SARS-CoV-2/FLU/RSV testing.  Fact Sheet for Patients: BloggerCourse.com  Fact Sheet for Healthcare Providers: SeriousBroker.it  This test is not yet approved or cleared by the [place] FDA and has  been authorized for detection and/or diagnosis of SARS-CoV-2 by FDA under an Emergency Use Authorization (EUA). This EUA will remain in effect (meaning this test can be used) for the duration of the COVID-19 declaration under Section 564(b)(1) of the Act, 21 U.S.C. section 360bbb-3(b)(1), unless the authorization is terminated or revoked.  Performed at [facility], [address] [place], [place] [address]   Blood culture (routine x 2)     Status: None (Preliminary result)   Collection Time: 10/31/20 12:02 AM   Specimen: BLOOD   Result Value Ref Range Status   Specimen Description BLOOD LEFT ASSIST CONTROL  Final   Special Requests   Final    BOTTLES DRAWN AEROBIC AND ANAEROBIC Blood Culture results may not be optimal due to an excessive volume of blood received in culture bottles   Culture   Final    NO GROWTH 1 DAY Performed at [facility], [address], [place], [place] [address]    Report Status PENDING  Incomplete  Blood culture (routine x 2)     Status: None (Preliminary result)   Collection Time: 10/31/20 12:02 AM   Specimen: BLOOD  Result Value Ref Range Status   Specimen Description BLOOD RIGHT ASSIST CONTROL  Final   Special Requests   Final    BOTTLES DRAWN AEROBIC AND ANAEROBIC Blood Culture results may not be optimal due to an excessive volume of blood received in culture bottles   Culture   Final    NO GROWTH 1 DAY Performed at [facility], [address], [place], [place] [address]    Report Status PENDING  Incomplete  MRSA Next Gen by PCR, Nasal     Status: None   Collection Time: 10/31/20 10:39 AM   Specimen: Nasal Mucosa; Nasal Swab  Result Value Ref Range Status   MRSA by PCR Next Gen NOT DETECTED NOT DETECTED Final    Comment: (NOTE) The GeneXpert MRSA Assay (FDA approved for NASAL specimens only), is one component of a comprehensive MRSA colonization surveillance program. It is not intended to diagnose MRSA infection nor to guide or monitor treatment for MRSA infections. Test performance is not FDA approved in patients less than 8 years old. Performed at [facility], [address] [place] [place], [place] [address]     Signed:  Alberteen Sam MD.  [facility] 11/01/2020, 2:07 PM

## 2020-11-01 NOTE — Progress Notes (Signed)
AVS given and explained to Patient's daughter who can speak [place] and [place]. Daughter explained/translated to patient about the meds and summary discharge. Awaiting on family member to bring home O2 tank.

## 2020-11-01 NOTE — Assessment & Plan Note (Signed)
Resolved

## 2020-11-01 NOTE — Hospital Course (Addendum)
Denise [name] is a 75 y.o. F with pulmonary fibrosis followed by Dr. Belia Heman, pHTN, Sjogren's syndrome, hypothyroidism, and HTN who presented with SOB, cough and fever.  Patient started to have fever and cough 3-4 days prior to admission.  This progressed, became worse, and so she came to the ER.  In th ER, SpO2 89% on her home O2, respiratory rate 19-30 over several hours.  CXR showed bilateral infiltrates.  She was admitted, given antibiotics.

## 2020-11-01 NOTE — Assessment & Plan Note (Signed)
Presented with tachycpnea, dyspnea and SpO2 <90% on home O2.

## 2020-11-01 NOTE — Plan of Care (Signed)

## 2020-11-01 NOTE — Progress Notes (Signed)
PT Cancellation Note  Patient Details Name: Denise [name] MRN: [id number] DOB: November 11, 1945   Cancelled Treatment:    Reason Eval/Treat Not Completed: OT screened, no needs identified, will sign off. Order received, pt chart reviewed. OT exiting room as PT is arriving. OT states pt is Independent and functioning at baseline. Daughter also in room and reports pt mobility as normal. PT signing off. Please re-consult if pt status changes.    Basilia Jumbo PT, DPT 11/01/20 10:34 AM [phone]

## 2020-11-01 NOTE — Assessment & Plan Note (Signed)
HR and RR elevated on admission, likely due to respiratory failure, sepsis ruled out.  Treated with broad spectrum antibiotics at first, narrowed to ceftriaxone and azithromycin.  Clinically improved.  Patient now mentating at baseline, taking orals.  Temp < 100 F, heart rate < 100bpm, RR < 24, ambulated around unit with SpO2 normal on baseline O2.   Stable for discharge to complete 5 more days Augmentin and azithromycin.

## 2020-11-04 ENCOUNTER — Ambulatory Visit (INDEPENDENT_AMBULATORY_CARE_PROVIDER_SITE_OTHER): Payer: Medicaid Other | Admitting: Internal Medicine

## 2020-11-04 ENCOUNTER — Encounter: Payer: Self-pay | Admitting: Internal Medicine

## 2020-11-04 ENCOUNTER — Other Ambulatory Visit: Payer: Self-pay

## 2020-11-04 VITALS — BP 112/64 | HR 99 | Temp 97.3°F | Ht 61.0 in | Wt 139.8 lb

## 2020-11-04 DIAGNOSIS — J45909 Unspecified asthma, uncomplicated: Secondary | ICD-10-CM

## 2020-11-04 DIAGNOSIS — M3500 Sicca syndrome, unspecified: Secondary | ICD-10-CM | POA: Diagnosis not present

## 2020-11-04 DIAGNOSIS — M359 Systemic involvement of connective tissue, unspecified: Secondary | ICD-10-CM

## 2020-11-04 DIAGNOSIS — J849 Interstitial pulmonary disease, unspecified: Secondary | ICD-10-CM

## 2020-11-04 DIAGNOSIS — J8410 Pulmonary fibrosis, unspecified: Secondary | ICD-10-CM

## 2020-11-04 DIAGNOSIS — J84112 Idiopathic pulmonary fibrosis: Secondary | ICD-10-CM | POA: Diagnosis not present

## 2020-11-04 DIAGNOSIS — J9611 Chronic respiratory failure with hypoxia: Secondary | ICD-10-CM

## 2020-11-04 MED ORDER — PREDNISONE 20 MG PO TABS: 20.0000 mg | ORAL_TABLET | Freq: Every day | ORAL | 2 refills | Status: DC

## 2020-11-04 MED ORDER — ALBUTEROL SULFATE (2.5 MG/3ML) 0.083% IN NEBU: 2.5000 mg | INHALATION_SOLUTION | Freq: Four times a day (QID) | RESPIRATORY_TRACT | 12 refills | Status: AC | PRN

## 2020-11-04 NOTE — Progress Notes (Signed)
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 (dating back to 2014) Medical history significant for mild pulmonary hypertension Patient is from [place] speaks Spanish, migraine to the [place] in 2013   TEST/EVENTS :  CT chest August 31, 2020 negative for PE, stable pulmonary fibrosis stable borderline enlarged adenopathy  TTE 10/19:  EF of 65-70%, mild LVH, no RWMA, normal LV diastolic function, mildly dilated LA, RVSF cavity size normal with normal RVSF, mildly dilated RA, PASP 45 mmHg, dilated IVC consistent with elevated CVP, trivial pericardial effusion was noted  CT chest October 2019 shows diffuse interstitial coarsening consistent with known pulmonary fibrosis.  Overall slight progression of disease since prior CT chest.  2018.  Bilateral hilar and mediastinal adenopathy.   09/17/2020 Follow up : ILD , post hospital follow-up (visit with Interpretor)  Patient returns for a follow-up visit.  She has underlying interstitial lung disease associated with connective tissue disorder.  Patient has Sjogren's with scleroderma features.  She has managed by rheumatology and is on Plaquenil daily. Patient was recently admitted last month for acute ILD flare and bronchitis.  She was treated with IV antibiotics, nebulized bronchodilators and steroids.  She did require oxygen at discharge at 2 L.  She was discharged on antibiotics and steroids.  But she has finished.  Since discharge patient says she is feeling better but remains short of breath. She can not use her portable tanks as she could not figure out how they work.  O2 sats 84% on room air on arrival. Today in office walk test required 2l/m O2 to keep sats >88-90%. POC device was used with O2 sats 93% on 2l/m . She wants  a POC , can not lift or handle heavy tanks . She is by herself a lot.  Lives with daughter , light house chores. Gets short of breath easily .  Spends 6 months in [place] and 6 months in [place]. Plans to go in Dec.    10/2017 Troponin negative x 2. EKG without acute significant abnormalities. Echo showed an EF of 65-70%, mild LVH, no RWMA, normal LV diastolic function, mildly dilated LA, RVSF cavity size normal with normal RVSF, mildly dilated RA, PASP 45 mmHg, dilated IVC consistent with elevated CVP, trivial pericardial effusion was noted.    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 Intermittent use of prednisone antibiotics which makes her feel better  Previous admission to October 2019 Incidental finding of pulmonary fibrosis on CT chest findings  2 admissions for infection and exacerbation in last 2 months She feels miserable Has dx of Sjogrens will need rheumatology referral  +exacerbation at this time No evidence of heart failure at this time No evidence or signs of infection at this time No respiratory distress No fevers, chills, nausea, vomiting, diarrhea No evidence of lower extremity edema No evidence hemoptysis  Chronic cough for many years Chronic shortness of breath over the last several months +wheezing today  From [place] Migrated to [place] in 2013   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 life Previous smoker 1 pack a day for 30 years  Quit 2002   PFTs October 2022 Reviewed in detail with the patient Mild restrictive lung disease with FEV1 of 84% predicted FVC is 66% predicted  Patient had questions are whether her lungs are getting worse  At this time, she has had 2 hospitalizations for her breathing  problems She is now on oxygen I have explained that her breathing is worse    After discussion with patient, she had a significant tobacco abuse approximately 1 pack a day for the last 40 years along with cooking with firewood on a daily basis as a housewife  PAST MEDICAL HISTORY :   has a past medical history of Hypertension, Pulmonary fibrosis (HCC), Pulmonary hypertension (HCC), Sjogren's disease (HCC), and Thyroid disease.  has no past surgical history on file. Prior to Admission medications   Medication Sig Start Date End Date Taking? Authorizing Provider  hydroxychloroquine (PLAQUENIL) 200 MG tablet Take 1 tablet (200 mg total) by mouth daily. 11/09/17   Katha Hamming, MD  levothyroxine (SYNTHROID, LEVOTHROID) 100 MCG tablet Take 1 tablet (100 mcg total) by mouth daily at 6 (six) AM. 11/09/17   Katha Hamming, MD  naproxen (NAPROSYN) 500 MG tablet Take 1 tablet (500 mg total) by mouth 2 (two) times daily with a meal. 11/08/17   Katha Hamming, MD  pilocarpine (SALAGEN) 5 MG tablet Take 5 mg by mouth 3 (three) times daily.    [provider]   No Known Allergies  SOCIAL HISTORY:  reports that she has quit smoking. She has never used smokeless tobacco. She reports that she does not drink alcohol and does not use drugs.   Review of Systems:  Gen:  Denies  fever, sweats, chills weight loss  HEENT: Denies blurred vision, double vision, ear pain, eye pain, hearing loss, nose bleeds, sore throat Cardiac:  No dizziness, chest pain or heaviness, chest tightness,edema, No JVD Resp:   +cough, -sputum production, +shortness of breath,+wheezing, -hemoptysis,  Other:  All other systems negative BP 112/64 (BP Location: Right Arm, Patient Position: Sitting, Cuff Size: Normal)   Pulse 99   Temp (!) 97.3 F (36.3 C) (Oral)   Ht 5\' 1"  (1.549 m)   Wt 139 lb 12.8 oz (63.4 kg)   SpO2 95%   BMI 26.41 kg/m     Physical Examination:   General Appearance: No  distress ill appearing EYES PERRLA, EOM intact.   NECK Supple, No JVD Pulmonary: normal breath sounds, + wheezing. +crackels CardiovascularNormal S1,S2.  No m/r/g.   ALL OTHER ROS ARE NEGATIVE      ASSESSMENT / PLAN:  75 year old Hispanic female seen today for follow-up several abnormal CT chest findings with bilateral interstitial infiltrates dating back to 2018 most likely related to fibrotic NSIP UIP with pulmonary fibrosis, patient has had 2 admission for last 2 months now on oxygen therapy.    After reviewing the CT scans with the patient and family via interpreter patient has progressive hypoxic respiratory failure from several infections and admissions in the last several months leading to respiratory insufficiency with chronic hypoxic respiratory failure.  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 All questions answered  Follow-up 6 months  Total time spent 45 minutes  Lucie Leather, M.D.  [facility]  Medical Director [facility] Medical Director [facility] Cardio-Pulmonary Department

## 2020-11-04 NOTE — Patient Instructions (Addendum)
Continue Oxygen as prescribed Start PRED 20 mg daily for 10 days For Wheezing and COUGH, can use ALBUTEROL NEBS every 6 hrs as needed POC needed Rheumatology Referral

## 2020-11-05 LAB — CULTURE, BLOOD (ROUTINE X 2)
Culture: NO GROWTH
Culture: NO GROWTH

## 2020-11-28 ENCOUNTER — Telehealth: Payer: Self-pay | Admitting: Internal Medicine

## 2020-11-28 NOTE — Telephone Encounter (Signed)
Spoke to patient's granddaughter, Rosa(DPR). Clotilde Dieter is requesting for last OV note to be faxed to [facility].  OV note faxed. Nothing further needed.

## 2020-12-03 ENCOUNTER — Other Ambulatory Visit: Payer: Medicaid Other | Admitting: Student

## 2020-12-03 ENCOUNTER — Other Ambulatory Visit: Payer: Self-pay

## 2020-12-03 DIAGNOSIS — R0602 Shortness of breath: Secondary | ICD-10-CM

## 2020-12-03 DIAGNOSIS — K5900 Constipation, unspecified: Secondary | ICD-10-CM

## 2020-12-03 DIAGNOSIS — Z515 Encounter for palliative care: Secondary | ICD-10-CM

## 2020-12-03 DIAGNOSIS — E039 Hypothyroidism, unspecified: Secondary | ICD-10-CM

## 2020-12-03 NOTE — Progress Notes (Signed)
Therapist, nutritional Palliative Care Consult Note Telephone: [phone]  Fax: [phone]    Date of encounter: 12/03/20 12:31 PM PATIENT NAME: Denise [name] [address] [place] [address]   [phone] (home)  DOB: [birth year] MRN: [id number] PRIMARY CARE PROVIDER:    Cyndi [name]  [address] [place] [address] [phone]  REFERRING PROVIDER:   Marya Fossa, PA-C [address] [place],  [place] [address] [phone]  RESPONSIBLE PARTY:    Contact Information     Name Relation Home Work Mobile   Denise [name] Daughter   [phone]   Denise [name] Daughter [phone]     Denise [name]   [phone]        I met face to face with patient and family in the home. Palliative Care was asked to follow this patient by consultation request of  Denise Fossa, PA-C to address advance care planning and complex medical decision making. This is a follow up visit.                                   ASSESSMENT AND PLAN / RECOMMENDATIONS:   Advance Care Planning/Goals of Care: Goals include to maximize quality of life and symptom management. Our advance care planning conversation included a discussion about:    The value and importance of advance care planning  Experiences with loved ones who have been seriously ill or have died  CODE STATUS: Full Code  Symptom Management/Plan:   Shortness of breath, acute on chronic respiratory failure-secondary to pulmonary fibrosis, ILD. Patient encouraged to wear oxygen at all times at 2 L/min.  Patient does not always wear or use portable tanks due to heaviness of tanks. Will have palliative nurse navigator check with her pulmonologist to see if there are any other options. Patient also needs humidifier for oxygen concentrator; [employer] to be contacted with request.  Continue albuterol nebulizer treatments every 6 hours as needed for shortness of breath  wheezing.  Constipation-Start Senna S one tablet BID; script sent to pharmacy.   Hypothyroidism patient has 14 days left of levothyroxine 100 mcg, patient will need refill.  No recent lab work available.  PCP notified to see if she will need to go in for lab work prior to getting refills.    Patient is to follow up with rheumatology per hospital discharge instructions. Daughter is to contact rheumatology to see if she can get a early appointment as she will be out of the country when rheumatology appointment scheduled at the end of December.  Follow up Palliative Care Visit: Palliative care will continue to follow for complex medical decision making, advance care planning, and clarification of goals. Return in  4-8 weeks or prn.  I spent 60 minutes providing this consultation. More than 50% of the time in this consultation was spent in counseling and care coordination.   PPS: 50%  HOSPICE ELIGIBILITY/DIAGNOSIS: TBD  Chief Complaint: Palliative Medicine follow up visit.   HISTORY OF PRESENT ILLNESS:  Denise [name] is a 75 y.o. year old female  with ILD, pulmonary fibrosis, mild pulmonary hypertension, Sjorgrens syndrome, hypothyroidism. Patient hospitalized 10/19 through 11/01/20 due to bilateral pneumonia, acute on chronic respiratory failure with hypoxia, pulmonary fibrosis, Jorgen's disease.  Spanish interpreter Denise [name] is present. Patient reports doing okay. She does not have on oxygen at present as she just got out of the shower.  Denies shortness of breath at rest; endorses with exertion.She states she wears at night, during the day PRN shortness of breath. She does have small tanks present, but expresses difficulty carrying. There is a question if her insurance will cover a different type of portable tank. Uses nebulizer BID PRN with effectiveness. She endorses constipation; did not start the Senna S that was recommended.  Patient will need a refill on her levothyroxine 100 mcg  daily.  Patient states she believes she was to have an appointment with rheumatology last week but unsure of what happened with appointment. She is going to [place] next month; will be there for 4 months. A 10-point review of systems is negative, except for the pertinent positives and negatives detailed in the HPI.    History obtained from review of EMR, discussion with primary team, and interview with family, facility staff/caregiver and/or [name].  I reviewed available labs, medications, imaging, studies and related documents from the EMR.  Records reviewed and summarized above.    Physical Exam: Current and past weights: Pulse 96, resp 20, b/p 112/ 68, sats 87% on room air, up to 97% at 2lpm Constitutional: NAD General: frail appearing EYES: anicteric sclera, lids intact, no discharge  ENMT: intact hearing, oral mucous membranes moist, dentition intact CV: S1S2, RRR, no LE edema Pulmonary: LCTA except fine crackles to left base, no increased work of breathing, occasional cough Abdomen:  normo-active BS + 4 quadrants, soft and non tender GU: deferred MSK: moves all extremities, ambulatory Skin: warm and dry, no rashes or wounds on visible skin Neuro:  no generalized weakness, A & O x 3 Psych: non-anxious affect, pleasant Hem/lymph/immuno: no widespread bruising   Thank you for the opportunity to participate in the care of [name].  The palliative care team will continue to follow. Please call our office at [phone] if we can be of additional assistance.   Ezekiel Slocumb, NP   COVID-19 PATIENT SCREENING TOOL Asked and negative response unless otherwise noted:   Have you had symptoms of covid, tested positive or been in contact with someone with symptoms/positive test in the past 5-10 days? No

## 2020-12-10 ENCOUNTER — Telehealth: Payer: Self-pay | Admitting: Internal Medicine

## 2020-12-10 DIAGNOSIS — J84112 Idiopathic pulmonary fibrosis: Secondary | ICD-10-CM

## 2020-12-10 NOTE — Telephone Encounter (Signed)
Order placed. Nothing further needed at this time. 

## 2020-12-10 NOTE — Telephone Encounter (Signed)
Received below secure epic chat from Beryle Beams, RN.  Dr. Belia Heman, please advise. Thanks    Good Afternoon. Denise Maine NP, Palliative NP, asked that I reach out to see if the pulmonolgist would send an order in for a portable concentrator or portable smaller tanks then what Denise [name] has. She currently has D tanks. Patient is not able to wear her oxygen continuously as the portable tanks she currently has is too heavy for her. Thank you!

## 2021-01-09 ENCOUNTER — Ambulatory Visit: Payer: Medicaid Other | Admitting: Internal Medicine

## 2021-02-26 ENCOUNTER — Telehealth: Payer: Self-pay

## 2021-02-26 NOTE — Telephone Encounter (Signed)
Late entry for 02/19/21: Phone call placed to patient's daughter by [facility] liaison, Lemmie Evens, to check in on patient. Lemmie Evens shared that patient is out of the country in [place] for another 5 weeks. Plan is for Marla to f/u in 5 weeks.

## 2021-07-16 ENCOUNTER — Other Ambulatory Visit: Payer: Medicaid Other

## 2021-07-16 DIAGNOSIS — Z515 Encounter for palliative care: Secondary | ICD-10-CM

## 2021-07-16 NOTE — Progress Notes (Signed)
Palliative care SW outreached patient to complete telephonic visit.    Palliative care SW outreached patient's daughter, Jearld Adjutant, to complete telephonic visit. Daughter provided update on medical condition and/or changes.   Daughter endorses that patient is doing well. Daughter shares that she does have some eye pain due to a recent eye procedure but has been told by ophthalmology that the pain will subside soon.  Continues to be independent with ADL's. No falls reported.   Hospitalizations: none recent.   Appetite: patients appetite remains good. No significant weight changes noted.    No medication needs or adjustments noted. Patient is compliant with medications.    Psychosocial assessment: completed. No financial, food insecurities or issues with transportation noted during call. No other psychosocial needs. No S/S of depression or anxiety noted.   Next in person [facility] Visit scheduled for 08/04/21 @1pm  with [facility] NP and interpreter.     Palliative care will continue to monitor and assist with long term care planning as needed.

## 2021-08-04 ENCOUNTER — Other Ambulatory Visit: Payer: Medicaid Other | Admitting: Student

## 2021-09-11 ENCOUNTER — Other Ambulatory Visit: Payer: Medicaid Other | Admitting: Student

## 2021-09-11 DIAGNOSIS — R630 Anorexia: Secondary | ICD-10-CM

## 2021-09-11 DIAGNOSIS — R0602 Shortness of breath: Secondary | ICD-10-CM

## 2021-09-11 DIAGNOSIS — K5900 Constipation, unspecified: Secondary | ICD-10-CM

## 2021-09-11 DIAGNOSIS — Z515 Encounter for palliative care: Secondary | ICD-10-CM

## 2021-09-11 NOTE — Progress Notes (Signed)
Therapist, nutritional Palliative Care Consult Note Telephone: [phone]  Fax: [phone]    Date of encounter: 09/11/21 11:13 AM PATIENT NAME: Denise [name] [address] [place] [address]   [phone] (home)  DOB: Nov 02, 1945 MRN: [id number] PRIMARY CARE PROVIDER:    Cyndi Bender  [address] [place] [address] [phone]  REFERRING PROVIDER:   Marya Fossa, PA-C [address] [place],  [place] [address] [phone]  RESPONSIBLE PARTY:    Contact Information     Name Relation Home Work Mobile   [place] Daughter   [phone]   [facility] Daughter [phone]     Caffie Pinto   [phone]        I met face to face with patient and family in the home. Palliative Care was asked to follow this patient by consultation request of  Marya Fossa, PA-C to address advance care planning and complex medical decision making. This is a follow up visit.  Interpreter Denise [name] is present.                                    ASSESSMENT AND PLAN / RECOMMENDATIONS:   Advance Care Planning/Goals of Care: Goals include to maximize quality of life and symptom management. Patient/health care surrogate gave his/her permission to discuss. Our advance care planning conversation included a discussion about:    The value and importance of advance care planning  Experiences with loved ones who have been seriously ill or have died  Exploration of personal, cultural or spiritual beliefs that might influence medical decisions  Exploration of goals of care in the event of a sudden injury or illness  CODE STATUS: Full Code   Education provided on Palliative Medicine. Will continue to provide supportive care, symptom management as needed.   Symptom Management/Plan:  Decline in appetite- has diabetic supplement but milk based feels milk based products thicken phlegm. Recommend ensure clear, or  boost breeze. Encourage foods patient enjoys.   Shortness of breath-due to her pulmonary fibrosis, ILD. Patient reports breathing being stable; she is now wearing oxygen PRN. Uses albuterol at night. She is encouraged to use albuterol BID. Patient will need refill on albuterol soon; she does have refills, pharmacy notified with refill request.   Constipation-has improved. Continue diet with fiber, adequate fluids. Encourage senna-S as needed.   Follow up Palliative Care Visit: Palliative care will continue to follow for complex medical decision making, advance care planning, and clarification of goals. Return in 10-12 weeks or prn.   This visit was coded based on medical decision making (MDM).  HOSPICE ELIGIBILITY/DIAGNOSIS: TBD  Chief Complaint: Palliative Medicine follow up visit.   HISTORY OF PRESENT ILLNESS:  Denise [name] is a 76 y.o. year old female  with ILD, pulmonary fibrosis, mild pulmonary hypertension, Sjorgrens syndrome, hypothyroidism.    Patient reports doing well. She alternates between the homes of two daughters. 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 Has not completely healed since then. She reports pain has improved since the removal. She reports no vision to her left eye; she was told it will take about a year to fully recuperate. She plans to have cataract surgery to right eye in future. Breathing has been good. Only wearing oxygen PRN.  Uses nebulizer B1-2 times a day. Not eating very well;  reports some weight loss but unsure of how much she has lost. Patient was supposed to follow up with rheumatology discussed on last visit due to her CTD- ILD. No longer thanking plaquenil.   History obtained from review of EMR, discussion with primary team, and interview with family, facility staff/caregiver and/or [name] [place].  I reviewed available labs, medications, imaging, studies and related documents  from the EMR.  Records reviewed and summarized above.   ROS  A 10-point review of systems is negative, except for the pertinent positives and negatives detailed in the HPI.   Physical Exam: Pulse 84, resp 18, b/p 114/60, sats 88% on RA up to 97% at 2 lpm Constitutional: NAD General: frail appearing, thin  EYES:  lids intact, no discharge  ENMT: intact hearing, oral mucous membranes moist CV: S1S2, RRR, no LE edema Pulmonary: crackles to bilateral lung, no increased work of breathing, occasional non-productive cough Abdomen: normo-active BS + 4 quadrants, soft and non tender, no ascites GU: deferred MSK: +sarcopenia, moves all extremities, ambulatory Skin: warm and dry, no rashes or wounds on visible skin Neuro:  no generalized weakness,  A & O x 3 Psych: non-anxious affect, pleasant Hem/lymph/immuno: no widespread bruising   Thank you for the opportunity to participate in the care of [name].  The palliative care team will continue to follow. Please call our office at [phone] if we can be of additional assistance.   Ezekiel Slocumb, NP   COVID-19 PATIENT SCREENING TOOL Asked and negative response unless otherwise noted:   Have you had symptoms of covid, tested positive or been in contact with someone with symptoms/positive test in the past 5-10 days? No

## 2021-12-01 NOTE — Progress Notes (Signed)
No show. Patient not at address on file.

## 2021-12-02 ENCOUNTER — Other Ambulatory Visit: Payer: Self-pay

## 2021-12-02 ENCOUNTER — Inpatient Hospital Stay: Payer: Medicaid Other

## 2021-12-02 ENCOUNTER — Inpatient Hospital Stay (HOSPITAL_COMMUNITY)
Admit: 2021-12-02 | Discharge: 2021-12-02 | Disposition: A | Discharge status: Home / Self Care | Payer: Medicaid Other | Attending: Internal Medicine | Admitting: Internal Medicine

## 2021-12-02 ENCOUNTER — Inpatient Hospital Stay
Admission: EM | Admit: 2021-12-02 | Discharge: 2021-12-07 | DRG: 193 | Disposition: A | Discharge status: Home / Self Care | Payer: Medicaid Other | Attending: Hospitalist | Admitting: Hospitalist

## 2021-12-02 ENCOUNTER — Emergency Department: Payer: Medicaid Other

## 2021-12-02 DIAGNOSIS — J9601 Acute respiratory failure with hypoxia: Secondary | ICD-10-CM | POA: Diagnosis not present

## 2021-12-02 DIAGNOSIS — N179 Acute kidney failure, unspecified: Secondary | ICD-10-CM | POA: Diagnosis present

## 2021-12-02 DIAGNOSIS — Z79899 Other long term (current) drug therapy: Secondary | ICD-10-CM | POA: Diagnosis not present

## 2021-12-02 DIAGNOSIS — I272 Pulmonary hypertension, unspecified: Secondary | ICD-10-CM

## 2021-12-02 DIAGNOSIS — Z9981 Dependence on supplemental oxygen: Secondary | ICD-10-CM | POA: Diagnosis not present

## 2021-12-02 DIAGNOSIS — J8410 Pulmonary fibrosis, unspecified: Secondary | ICD-10-CM | POA: Diagnosis present

## 2021-12-02 DIAGNOSIS — E039 Hypothyroidism, unspecified: Secondary | ICD-10-CM | POA: Diagnosis not present

## 2021-12-02 DIAGNOSIS — M3500 Sicca syndrome, unspecified: Secondary | ICD-10-CM | POA: Diagnosis not present

## 2021-12-02 DIAGNOSIS — E861 Hypovolemia: Secondary | ICD-10-CM | POA: Diagnosis not present

## 2021-12-02 DIAGNOSIS — B9789 Other viral agents as the cause of diseases classified elsewhere: Secondary | ICD-10-CM | POA: Diagnosis present

## 2021-12-02 DIAGNOSIS — Z87891 Personal history of nicotine dependence: Secondary | ICD-10-CM

## 2021-12-02 DIAGNOSIS — J849 Interstitial pulmonary disease, unspecified: Secondary | ICD-10-CM | POA: Diagnosis not present

## 2021-12-02 DIAGNOSIS — B348 Other viral infections of unspecified site: Secondary | ICD-10-CM | POA: Diagnosis not present

## 2021-12-02 DIAGNOSIS — I9589 Other hypotension: Secondary | ICD-10-CM | POA: Diagnosis not present

## 2021-12-02 DIAGNOSIS — I959 Hypotension, unspecified: Secondary | ICD-10-CM | POA: Diagnosis present

## 2021-12-02 DIAGNOSIS — J13 Pneumonia due to Streptococcus pneumoniae: Principal | ICD-10-CM | POA: Diagnosis present

## 2021-12-02 DIAGNOSIS — J9621 Acute and chronic respiratory failure with hypoxia: Secondary | ICD-10-CM | POA: Diagnosis not present

## 2021-12-02 DIAGNOSIS — E079 Disorder of thyroid, unspecified: Secondary | ICD-10-CM | POA: Diagnosis present

## 2021-12-02 DIAGNOSIS — Z79890 Hormone replacement therapy (postmenopausal): Secondary | ICD-10-CM

## 2021-12-02 DIAGNOSIS — Z20822 Contact with and (suspected) exposure to covid-19: Secondary | ICD-10-CM | POA: Diagnosis present

## 2021-12-02 DIAGNOSIS — I2489 Other forms of acute ischemic heart disease: Secondary | ICD-10-CM | POA: Diagnosis present

## 2021-12-02 DIAGNOSIS — I10 Essential (primary) hypertension: Secondary | ICD-10-CM | POA: Diagnosis present

## 2021-12-02 LAB — RESPIRATORY PANEL BY PCR

## 2021-12-02 LAB — TROPONIN I (HIGH SENSITIVITY)
Troponin I (High Sensitivity): 35 ng/L — ABNORMAL HIGH (ref <18)
Troponin I (High Sensitivity): 34 ng/L — ABNORMAL HIGH (ref <18)

## 2021-12-02 LAB — COMPREHENSIVE METABOLIC PANEL
ALT: 13 U/L (ref 0–44)
AST: 35 U/L (ref 15–41)
Albumin: 3.2 g/dL — ABNORMAL LOW (ref 3.5–5.0)
Alkaline Phosphatase: 89 U/L (ref 38–126)
Anion gap: 11 (ref 5–15)
BUN: 21 mg/dL (ref 8–23)
CO2: 23 mmol/L (ref 22–32)
Calcium: 9.3 mg/dL (ref 8.9–10.3)
Chloride: 100 mmol/L (ref 98–111)
Creatinine, Ser: 1.24 mg/dL — ABNORMAL HIGH (ref 0.44–1.00)
GFR, Estimated: 46 mL/min — ABNORMAL LOW (ref >60)
Glucose, Bld: 101 mg/dL — ABNORMAL HIGH (ref 70–99)
Potassium: 4 mmol/L (ref 3.5–5.1)
Sodium: 134 mmol/L — ABNORMAL LOW (ref 135–145)
Total Bilirubin: 1.1 mg/dL (ref 0.3–1.2)
Total Protein: 8.3 g/dL — ABNORMAL HIGH (ref 6.5–8.1)

## 2021-12-02 LAB — HIV ANTIBODY (ROUTINE TESTING W REFLEX): HIV Screen 4th Generation wRfx: NONREACTIVE

## 2021-12-02 LAB — RESP PANEL BY RT-PCR (RSV, FLU A&B, COVID)  RVPGX2
Influenza A by PCR: NEGATIVE
Influenza B by PCR: NEGATIVE
Resp Syncytial Virus by PCR: NEGATIVE
SARS Coronavirus 2 by RT PCR: NEGATIVE

## 2021-12-02 LAB — CBC
HCT: 41.3 % (ref 36.0–46.0)
Hemoglobin: 13.2 g/dL (ref 12.0–15.0)
MCH: 26.6 pg (ref 26.0–34.0)
MCHC: 32 g/dL (ref 30.0–36.0)
MCV: 83.3 fL (ref 80.0–100.0)
Platelets: 281 10*3/uL (ref 150–400)
RBC: 4.96 MIL/uL (ref 3.87–5.11)
RDW: 16.9 % — ABNORMAL HIGH (ref 11.5–15.5)
WBC: 18.1 10*3/uL — ABNORMAL HIGH (ref 4.0–10.5)
nRBC: 0 % (ref 0.0–0.2)

## 2021-12-02 LAB — LACTIC ACID, PLASMA: Lactic Acid, Venous: 1.8 mmol/L (ref 0.5–1.9)

## 2021-12-02 LAB — STREP PNEUMONIAE URINARY ANTIGEN: Strep Pneumo Urinary Antigen: POSITIVE — AB

## 2021-12-02 LAB — MRSA NEXT GEN BY PCR, NASAL: MRSA by PCR Next Gen: NOT DETECTED

## 2021-12-02 LAB — CORTISOL: Cortisol, Plasma: 9.2 ug/dL

## 2021-12-02 LAB — PROCALCITONIN: Procalcitonin: 0.28 ng/mL

## 2021-12-02 MED ORDER — LEVOTHYROXINE SODIUM 100 MCG PO TABS
100.0000 ug | ORAL_TABLET | Freq: Every day | ORAL | Status: DC
  Administered 2021-12-03 – 2021-12-07 (×5): 100 ug via ORAL
  Filled 2021-12-02 (×5): qty 1

## 2021-12-02 MED ORDER — ACETAMINOPHEN 325 MG PO TABS
650.0000 mg | ORAL_TABLET | Freq: Once | ORAL | Status: AC
  Administered 2021-12-02: 650 mg via ORAL
  Filled 2021-12-02: qty 2

## 2021-12-02 MED ORDER — SODIUM CHLORIDE 0.9 % IV SOLN
500.0000 mg | INTRAVENOUS | Status: DC
  Administered 2021-12-02: 500 mg via INTRAVENOUS
  Filled 2021-12-02: qty 5

## 2021-12-02 MED ORDER — SODIUM CHLORIDE 0.9 % IV BOLUS
1000.0000 mL | Freq: Once | INTRAVENOUS | Status: AC
  Administered 2021-12-02: 1000 mL via INTRAVENOUS

## 2021-12-02 MED ORDER — IOHEXOL 350 MG/ML SOLN
75.0000 mL | Freq: Once | INTRAVENOUS | Status: AC | PRN
  Administered 2021-12-02: 75 mL via INTRAVENOUS

## 2021-12-02 MED ORDER — PREDNISONE 20 MG PO TABS: 60.0000 mg | ORAL_TABLET | Freq: Every day | ORAL | Status: DC

## 2021-12-02 MED ORDER — PREDNISOLONE ACETATE 1 % OP SUSP
1.0000 [drp] | Freq: Two times a day (BID) | OPHTHALMIC | Status: DC
  Administered 2021-12-02 – 2021-12-07 (×10): 1 [drp] via OPHTHALMIC
  Filled 2021-12-02: qty 5

## 2021-12-02 MED ORDER — PILOCARPINE HCL 5 MG PO TABS: 5.0000 mg | ORAL_TABLET | Freq: Three times a day (TID) | ORAL | Status: DC

## 2021-12-02 MED ORDER — METHYLPREDNISOLONE SODIUM SUCC 125 MG IJ SOLR
125.0000 mg | Freq: Once | INTRAMUSCULAR | Status: AC
  Administered 2021-12-02: 125 mg via INTRAVENOUS
  Filled 2021-12-02: qty 2

## 2021-12-02 MED ORDER — ATORVASTATIN CALCIUM 20 MG PO TABS
20.0000 mg | ORAL_TABLET | Freq: Every day | ORAL | Status: DC
  Administered 2021-12-02 – 2021-12-07 (×6): 20 mg via ORAL
  Filled 2021-12-02 (×6): qty 1

## 2021-12-02 MED ORDER — ALBUTEROL SULFATE (2.5 MG/3ML) 0.083% IN NEBU: 2.5000 mg | INHALATION_SOLUTION | Freq: Four times a day (QID) | RESPIRATORY_TRACT | Status: DC | PRN

## 2021-12-02 MED ORDER — METHYLPREDNISOLONE SODIUM SUCC 125 MG IJ SOLR: 125.0000 mg | INTRAMUSCULAR | Status: DC

## 2021-12-02 MED ORDER — HYDROCORTISONE SOD SUC (PF) 100 MG IJ SOLR
100.0000 mg | Freq: Once | INTRAMUSCULAR | Status: DC
  Filled 2021-12-02: qty 2

## 2021-12-02 MED ORDER — PIPERACILLIN-TAZOBACTAM 3.375 G IVPB 30 MIN
3.3750 g | Freq: Once | INTRAVENOUS | Status: AC
  Administered 2021-12-02: 3.375 g via INTRAVENOUS
  Filled 2021-12-02: qty 50

## 2021-12-02 MED ORDER — SODIUM CHLORIDE 0.9 % IV SOLN: 2.0000 g | INTRAVENOUS | Status: DC

## 2021-12-02 MED ORDER — LACTATED RINGERS IV BOLUS
1000.0000 mL | Freq: Once | INTRAVENOUS | Status: AC
  Administered 2021-12-02: 1000 mL via INTRAVENOUS

## 2021-12-02 MED ORDER — ALBUTEROL SULFATE HFA 108 (90 BASE) MCG/ACT IN AERS: 2.0000 | INHALATION_SPRAY | RESPIRATORY_TRACT | Status: DC | PRN

## 2021-12-02 MED ORDER — SODIUM CHLORIDE 0.9 % IV SOLN
2.0000 g | Freq: Two times a day (BID) | INTRAVENOUS | Status: DC
  Administered 2021-12-02 – 2021-12-03 (×2): 2 g via INTRAVENOUS
  Filled 2021-12-02 (×2): qty 12.5

## 2021-12-02 MED ORDER — VANCOMYCIN HCL 1250 MG/250ML IV SOLN
1250.0000 mg | Freq: Once | INTRAVENOUS | Status: AC
  Administered 2021-12-02: 1250 mg via INTRAVENOUS
  Filled 2021-12-02: qty 250

## 2021-12-02 MED ORDER — ENOXAPARIN SODIUM 40 MG/0.4ML IJ SOSY
40.0000 mg | PREFILLED_SYRINGE | INTRAMUSCULAR | Status: DC
  Administered 2021-12-02 – 2021-12-03 (×2): 40 mg via SUBCUTANEOUS
  Filled 2021-12-02 (×2): qty 0.4

## 2021-12-02 NOTE — Consult Note (Signed)
PHARMACY -  BRIEF ANTIBIOTIC NOTE   Pharmacy has received consult(s) for vancomycin dosing from an ED provider.  The patient's profile has been reviewed for ht/wt/allergies/indication/available labs.    One time order(s) placed for Vancomycin 1250 mg IV.  Further antibiotics/pharmacy consults should be ordered by admitting physician if indicated.                       Thank you, Barrie Folk 12/02/2021  1:51 PM

## 2021-12-02 NOTE — Consult Note (Signed)
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  Patient reports that she has felt acutely more short of breath over the last 4 to 5 days with increased cough.  She has a chronic cough that ebbs and flows over the past few years with associated shortness of breath on exertion.  She reports that over the past 6 months, she has felt increasingly weak and fatigued with worsening of her shortness of breath.  She is also felt increased abdominal fullness and early satiety.  She reports some leg pains when she walks and tells me that she thinks her legs were more swollen (the doctors do not endorse that).  She reports a fever that been ongoing for the past couple of days but this was not measured.  They felt that her forehead was significantly warm.  She had an ophthalmology appointment today for procedure but this was canceled secondary to a temp of 100.8 and she was sent to the ED instead.  In the ED, she was started on 6 L of nasal cannula to maintain oxygen saturations above 90%.  White count was noted to be elevated and troponin was mildly elevated as well.  Creatinine was also noted to be elevated at 1.24.  Broad-spectrum antibiotics were started and the patient was admitted to the hospitalist service.  Patient is originally from [place] and has been in the [place] since 2013. She splits her time between the two countries. She has a history of cigarette smoking (quit) and has also been exposed to bio-fuel.  Pertinent  Medical History  Patient has a history of Sjogren's-scleroderma overlap syndrome and has been  seen by rheumatology in the past.  She was prescribed hydroxychloroquine which she tells me she is not currently taking.  Autoimmune workup from 2019 was notable for a positive ANA in addition to positive anti-SSA, antichromatin, and anticentromere B antibodies.  Her history is all so notable for interstitial lung disease and was previously seen by Dr. Jeralene Huff (last seen in 2022) after which she was lost to follow-up. She's had previous CT scans of the chest over the past few years, last of which was in 2022. 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 She had been on hydroxychloroquine but stopped. She was prescribed oxygen by her outpatient providers   Significant Hospital Events: Including procedures, antibiotic start and stop dates in addition to other pertinent events   12/02/2021: admitted  Objective   Blood pressure (!) 103/53, pulse 72, temperature 98.9 F (37.2 C), temperature source Oral, resp. rate 19, height 5\' 1"  (1.549 m), weight 59 kg, SpO2 100 %.        Intake/Output Summary (Last 24 hours) at 12/02/2021 1821 Last data filed at 12/02/2021 1815 Gross per 24 hour  Intake 3550 ml  Output 400 ml  Net 3150 ml   Filed Weights   12/02/21 1246 12/02/21 1349  Weight: 59 kg 59 kg    Examination: Physical Exam Constitutional:      General: She is not in acute distress.    Appearance: She is ill-appearing.  HENT:  Head: Normocephalic.     Mouth/Throat:     Mouth: Mucous membranes are moist.  Eyes:     Extraocular Movements: Extraocular movements intact.  Cardiovascular:     Rate and Rhythm: Regular rhythm. Tachycardia present.     Pulses: Normal pulses.  Pulmonary:     Breath sounds: Rhonchi and rales present.  Abdominal:     General: There is distension.     Palpations: Abdomen is soft.  Neurological:     General: No focal deficit present.     Mental Status: She is alert and oriented to person, place, and time. Mental  status is at baseline.    POCUS of the heart    Assessment & Plan:   #Acute on Chronic Hypoxic Respiratory Failure #Connective Tissue Disease associated ILD #Pulmonary Hypertension #RV Pressure Overload  Patient is presenting with acute on chronic hypoxic respiratory failure in the setting of known underlying CTD associated ILD and previous signs of pulmonary hypertension on TTE. Chest CT on my review does not look significantly worse compared to her prior CT in 2022 and shows traction bronchiectasis and reticulation, more notable in the lower lobes. She has not been on immune suppression and her disease is not controlled. Bedside POCUS shows RV dilation with a septal bounce, suggestive of RV pressure overload and worsening pulmonary hypertension.  While she has signs and symptoms suggestive of an URTI ( low grade fever and increased cough), it does not preclude her from having multiple active problems at the same time. The differential for her presentation includes VTE, infection, decompensated pulmonary hypertension, and ILD flare.   For workup, would recommend obtaining sputum cultures if possible and an expanded viral panel. Given findings on POCUS, would recommend a TTE and a cardiology consultation for possible RHC and evaluation for pulmonary hypertension. Would recommend a CT with PE protocol to rule out an acute PE as a cause behind her decompensation.  Should infectious and cardiac workup be negative, she could be a candidate for immune suppression for presumptive CTD associated ILD flare. In the interim, it is reasonable to maintain her on broad spectrum antibiotics for CAP coverage. She is not immune suppressed and unlikely to have a fungal infection. She is likely preload dependent given pulmonary hypertension.  Raechel Chute, MD [facility] 12/02/2021 6:50 PM   Labs   CBC: Recent Labs  Lab 12/02/21 1320  WBC 18.1*  HGB 13.2  HCT 41.3  MCV 83.3  PLT  281    Basic Metabolic Panel: Recent Labs  Lab 12/02/21 1320  NA 134*  K 4.0  CL 100  CO2 23  GLUCOSE 101*  BUN 21  CREATININE 1.24*  CALCIUM 9.3   GFR: Estimated Creatinine Clearance: 32.9 mL/min (A) (by C-G formula based on SCr of 1.24 mg/dL (H)). Recent Labs  Lab 12/02/21 1320 12/02/21 1513  PROCALCITON  --  0.28  WBC 18.1*  --   LATICACIDVEN 1.8  --     Liver Function Tests: Recent Labs  Lab 12/02/21 1320  AST 35  ALT 13  ALKPHOS 89  BILITOT 1.1  PROT 8.3*  ALBUMIN 3.2*   No results for input(s): "LIPASE", "AMYLASE" in the last 168 hours. No results for input(s): "AMMONIA" in the last 168 hours.  ABG No results found for: "PHART", "PCO2ART", "PO2ART", "HCO3", "TCO2", "ACIDBASEDEF", "O2SAT"   Coagulation Profile: No results for input(s): "INR", "PROTIME" in the last 168 hours.  Cardiac Enzymes: No results for input(s): "CKTOTAL", "CKMB", "CKMBINDEX", "TROPONINI"  in the last 168 hours.  HbA1C: No results found for: "HGBA1C"  CBG: No results for input(s): "GLUCAP" in the last 168 hours.  Review of Systems:   Review of Systems  Constitutional:  Positive for chills and fever.  Respiratory:  Positive for cough and shortness of breath. Negative for hemoptysis.   Gastrointestinal:        Fullness  Skin:  Negative for rash.     Past Medical History:  She,  has a past medical history of Hypertension, Pulmonary fibrosis (HCC), Pulmonary hypertension (HCC), Sjogren's disease (HCC), and Thyroid disease.   Surgical History:  History reviewed. No pertinent surgical history.   Social History:   reports that she has quit smoking. She has never used smokeless tobacco. She reports that she does not drink alcohol and does not use drugs.   Family History:  Her family history is not on file.   Allergies Allergies  Allergen Reactions   Oxycodone Itching     Home Medications  Prior to Admission medications   Medication Sig Start Date End Date Taking?  Authorizing Provider  atorvastatin (LIPITOR) 20 MG tablet Take 20 mg by mouth daily.   Yes [provider]  levothyroxine (SYNTHROID, LEVOTHROID) 100 MCG tablet Take 1 tablet (100 mcg total) by mouth daily at 6 (six) AM. 11/09/17  Yes Katha Hamming, MD  pilocarpine (SALAGEN) 5 MG tablet Take 5 mg by mouth 3 (three) times daily.   Yes [provider]  prednisoLONE acetate (PRED FORTE) 1 % ophthalmic suspension Place 1 drop into the left eye 2 (two) times daily. 11/05/21  Yes [provider]  senna-docusate (SENOKOT-S) 8.6-50 MG tablet Take 1 tablet by mouth at bedtime.   Yes [provider]  VENTOLIN HFA 108 (90 Base) MCG/ACT inhaler Inhale 1-2 puffs into the lungs every 6 (six) hours as needed for wheezing or shortness of breath. 12/01/21  Yes [provider]  albuterol (PROVENTIL) (2.5 MG/3ML) 0.083% nebulizer solution Take 3 mLs (2.5 mg total) by nebulization every 6 (six) hours as needed for wheezing or shortness of breath. 11/04/20   Erin Fulling, MD  guaiFENesin-codeine 100-10 MG/5ML syrup Take 5 mLs by mouth every 4 (four) hours as needed for cough. 11/01/20   Danford, Earl Lites, MD  hydroxychloroquine (PLAQUENIL) 200 MG tablet Take 1 tablet (200 mg total) by mouth daily. Patient not taking: Reported on 12/02/2021 11/09/17   Katha Hamming, MD  Respiratory Therapy Supplies (FLUTTER) DEVI 1 each by Does not apply route 4 (four) times daily. 12/07/17   Erin Fulling, MD     Total care time: 95 minutes

## 2021-12-02 NOTE — Assessment & Plan Note (Signed)
Trop mildly elevated at 35 and 34.

## 2021-12-02 NOTE — ED Notes (Signed)
Titrated patient down to 3L [place]

## 2021-12-02 NOTE — Progress Notes (Signed)
Pharmacy Antibiotic Note  Denise [name] is a 76 y.o. female admitted on 12/02/2021 with pneumonia.  Pharmacy has been consulted for cefepime dosing.  Assessment: Pharmacy consulted to dose cefepime in 77 y.o. 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    Allergies  Allergen Reactions   Oxycodone Itching   Plan: Cefepime 2gm IV Q12H  Antimicrobials this admission: cefepime 11/21 >>  Vancomycin x1 dose in ED Zosyn x1 dose in ED  Dose adjustments this admission: N/A  Microbiology results: 11/21 BCx: pending 11/21 Sputum: ordered    Thank you for allowing pharmacy to be a part of this patient's care.  12/21 12/02/2021 4:33 PM

## 2021-12-02 NOTE — ED Provider Notes (Addendum)
[facility] Provider Note    Event Date/Time   First MD Initiated Contact with Patient 12/02/21 1301     (approximate)   History   Fever   HPI  Denise [name] is a 76 y.o. female   Past medical history of pulmonary fibrosis, hypertension, Sjogren's, thyroid disease who presents to the emergency department with several days of productive cough and fever from home.  She has been coughing productive sputum for the last 1 week and developed a fever yesterday.  No known sick contacts.  She was hospitalized last year in October 2022 with similar symptoms and diagnosed with multifocal pneumonia.  She had been on a course of steroids but has no longer been taking steroids at home per patient and family report.  Patient was hypoxemic to 70% and improved with 6 L nasal cannula oxygen.  History is obtained by the patient.  Collateral information provided by independent historians including her family members were at bedside for the above history.  They also state that she is on 2 L of nasal cannula oxygen as needed from home.      Physical Exam   Triage Vital Signs: ED Triage Vitals  Enc Vitals Group     BP 12/02/21 1249 (!) 98/58     Pulse Rate 12/02/21 1249 94     Resp 12/02/21 1249 19     Temp 12/02/21 1249 99.3 F (37.4 C)     Temp Source 12/02/21 1249 Oral     SpO2 12/02/21 1248 (!) 78 %     Weight 12/02/21 1246 130 lb (59 kg)     Height --      Head Circumference --      Peak Flow --      Pain Score 12/02/21 1246 7     Pain Loc --      Pain Edu? --      Excl. in GC? --     Most recent vital signs: Vitals:   12/02/21 1500 12/02/21 1600  BP: (!) 88/64 (!) 96/58  Pulse: 76 72  Resp: 19 19  Temp:    SpO2: 100% 100%    General: Awake, no distress.  CV:  Appears clinically dehydrated with dry mucous membranes and poor skin turgor. Resp:  Normal effort.  No obvious wheezing or focalities or rales.  Hypoxemic to 70% on room air but  improved to 100% on 6 L nasal cannula. Abd:  No distention.     ED Results / Procedures / Treatments   Labs (all labs ordered are listed, but only abnormal results are displayed) Labs Reviewed  CBC - Abnormal; Notable for the following components:      Result Value   WBC 18.1 (*)    RDW 16.9 (*)    All other components within normal limits  COMPREHENSIVE METABOLIC PANEL - Abnormal; Notable for the following components:   Sodium 134 (*)    Glucose, Bld 101 (*)    Creatinine, Ser 1.24 (*)    Total Protein 8.3 (*)    Albumin 3.2 (*)    GFR, Estimated 46 (*)    All other components within normal limits  TROPONIN I (HIGH SENSITIVITY) - Abnormal; Notable for the following components:   Troponin I (High Sensitivity) 35 (*)    All other components within normal limits  TROPONIN I (HIGH SENSITIVITY) - Abnormal; Notable for the following components:   Troponin I (High Sensitivity) 34 (*)  All other components within normal limits  RESP PANEL BY RT-PCR (RSV, FLU A&B, COVID)  RVPGX2  CULTURE, BLOOD (ROUTINE X 2)  CULTURE, BLOOD (ROUTINE X 2)  EXPECTORATED SPUTUM ASSESSMENT W GRAM STAIN, RFLX TO RESP C  MRSA NEXT GEN BY PCR, NASAL  RESPIRATORY PANEL BY PCR  LACTIC ACID, PLASMA  CORTISOL  HIV ANTIBODY (ROUTINE TESTING W REFLEX)  STREP PNEUMONIAE URINARY ANTIGEN  LEGIONELLA PNEUMOPHILA SEROGP 1 UR AG     I reviewed labs and they are notable for blood cell count is elevated at 18 and troponin is 34,35  EKG  ED ECG REPORT I, Pilar Jarvis, the attending physician, personally viewed and interpreted this ECG.   Date: 12/02/2021  EKG Time: 1252  Rate: 92  Rhythm: normal sinus rhythm  Axis: nl  Intervals:none  ST&T Change: TWI v2-4    RADIOLOGY I independently reviewed and interpreted chest x-ray and see diffuse patchy opacities in bilateral lung fields concerning for multifocal pneumonia.   PROCEDURES:  Critical Care performed: Yes, see critical care procedure  note(s)  .Critical Care  Performed by: Pilar Jarvis, MD Authorized by: Pilar Jarvis, MD   Critical care provider statement:    Critical care time (minutes):  30   Critical care was necessary to treat or prevent imminent or life-threatening deterioration of the following conditions:  Respiratory failure   Critical care was time spent personally by me on the following activities:  Development of treatment plan with patient or surrogate, discussions with consultants, evaluation of patient's response to treatment, examination of patient, ordering and review of laboratory studies, ordering and review of radiographic studies, ordering and performing treatments and interventions, pulse oximetry, re-evaluation of patient's condition and review of old charts    MEDICATIONS ORDERED IN ED: Medications  albuterol (VENTOLIN HFA) 108 (90 Base) MCG/ACT inhaler 2 puff (has no administration in time range)  enoxaparin (LOVENOX) injection 40 mg (has no administration in time range)  azithromycin (ZITHROMAX) 500 mg in sodium chloride 0.9 % 250 mL IVPB (has no administration in time range)  predniSONE (DELTASONE) tablet 60 mg (has no administration in time range)  methylPREDNISolone sodium succinate (SOLU-MEDROL) 125 mg/2 mL injection 125 mg (has no administration in time range)  lactated ringers bolus 1,000 mL (has no administration in time range)  sodium chloride 0.9 % bolus 1,000 mL (0 mLs Intravenous Stopped 12/02/21 1504)  acetaminophen (TYLENOL) tablet 650 mg (650 mg Oral Given 12/02/21 1333)  piperacillin-tazobactam (ZOSYN) IVPB 3.375 g (0 g Intravenous Stopped 12/02/21 1435)  vancomycin (VANCOREADY) IVPB 1250 mg/250 mL (0 mg Intravenous Stopped 12/02/21 1613)  sodium chloride 0.9 % bolus 1,000 mL (1,000 mLs Intravenous New Bag/Given 12/02/21 1530)    Consultants:  I spoke with hospitalist for admission and regarding care plan for this patient.   IMPRESSION / MDM / ASSESSMENT AND PLAN / ED COURSE  I  reviewed the triage vital signs and the nursing notes.                              Differential diagnosis includes, but is not limited to, hypoxemic respiratory failure, viral URI, bacterial pneumonia, multifocal pneumonia, sepsis, ACS or PE   The patient is on the cardiac monitor to evaluate for evidence of arrhythmia and/or significant heart rate changes.  MDM: Patient with pulmonary fibrosis and history of physical exam consistent with pneumonia and chest x-ray suggestive of multifocal pneumonia on my personal read, hypoxemic respiratory failure requiring  nasal cannula oxygen support, given antibiotics for pneumonia and sepsis bolus for hypotension.  Admit.  I reassessed the patient and her blood pressure is slightly improved after fluid boluses to 96/58. Mentation unchanges alert and awake   Patient's presentation is most consistent with acute presentation with potential threat to life or bodily function.       FINAL CLINICAL IMPRESSION(S) / ED DIAGNOSES   Final diagnoses:  Acute respiratory failure with hypoxia (HCC)     Rx / DC Orders   ED Discharge Orders     None        Note:  This document was prepared using Dragon voice recognition software and may include unintentional dictation errors.    Pilar Jarvis, MD 12/02/21 2841    Pilar Jarvis, MD 12/02/21 [phone]

## 2021-12-02 NOTE — Assessment & Plan Note (Addendum)
Patient is presenting with 1 week history of malaise, productive cough and increasing shortness of breath with 1 day history of fever.  She normally wears 2 L nasal cannula at home due to underlying ILD, requiring up to 6 L to maintain oxygen saturation above 90%.   --2/2 rhino/enteroviral infection, strep pneumo PNA, worsening pulm HTN --pulm consulted, deemed not a ILD exacerbation, so steroid not continued.

## 2021-12-02 NOTE — ED Triage Notes (Signed)
Cough and fever at home for the last week with SOB.

## 2021-12-02 NOTE — Assessment & Plan Note (Signed)
In the setting of poor p.o. intake with hypovolemia on exam. --Cr 1.24 on presentation --hold further IVF

## 2021-12-02 NOTE — Assessment & Plan Note (Signed)
-  cont home levothyroxine

## 2021-12-02 NOTE — Assessment & Plan Note (Addendum)
In the setting of poor p.o. intake with evidence of AKI and hypovolemia on examination.  She is currently received 2 L of IV fluids, unfortunately with lack of significant response.  Will order an additional bolus and continue maintenance fluids.  Lactic acid within normal limits.  -1 L LR followed by 125 cc/h

## 2021-12-02 NOTE — H&P (Addendum)
History and Physical    Patient: Denise [name] QXI:[id number] DOB: 03-04-1945 DOA: 12/02/2021 DOS: the patient was seen and examined on 12/02/2021 PCP: Denise Fossa, PA-C  Patient coming from: Home  Chief Complaint:  Chief Complaint  Patient presents with   Fever   HPI: Denise [name] is a 77 y.o. 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  [name] states that for approximately 1 week, she has been experiencing malaise, productive cough and progressively worsening shortness of breath.  In addition, she has developed fever in the last 24 hours.  She denies any nausea, vomiting, diarrhea, chest pain.  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  When she arrived at their office, they recommended she go to the ER due to fever of 100.8.  ED course: On arrival to the ED, patient's blood pressure was 98/58 with further decline to 88/64.  She was placed on 6 L nasal cannula to maintain oxygen saturation above 90%.Initial work-up remarkable for elevated WBC at 18.1, creatinine of 1.24, troponin of 35, and chest x-ray concerning for edema versus infection.  Due to high suspicion for pneumonia, patient was started on Zosyn and vancomycin.  TRH contacted for admission.  Review of Systems: As mentioned in the history of present illness. All other systems reviewed and are negative.  Past Medical History:  Diagnosis Date   Hypertension    Pulmonary fibrosis (HCC)    Pulmonary hypertension (HCC)    a. TTE 10/19:  EF of 65-70%, mild LVH, no RWMA, normal LV diastolic function, mildly dilated LA, RVSF cavity size normal with normal RVSF, mildly  dilated RA, PASP 45 mmHg, dilated IVC consistent with elevated CVP, trivial pericardial effusion was noted   Sjogren's disease (HCC)    Thyroid disease    History reviewed. No pertinent surgical history. Social History:  reports that she has quit smoking. She has never used smokeless tobacco. She reports that she does not drink alcohol and does not use drugs.  Allergies  Allergen Reactions   Oxycodone Itching   History reviewed. No pertinent family history.  Prior to Admission medications   Medication Sig Start Date End Date Taking? Authorizing Provider  atorvastatin (LIPITOR) 20 MG tablet Take 20 mg by mouth daily.   Yes [provider]  levothyroxine (SYNTHROID, LEVOTHROID) 100 MCG tablet Take 1 tablet (100 mcg total) by mouth daily at 6 (six) AM. 11/09/17  Yes Katha Hamming, MD  pilocarpine (SALAGEN) 5 MG tablet Take 5 mg by mouth 3 (three) times daily.   Yes [provider]  prednisoLONE acetate (PRED FORTE) 1 % ophthalmic suspension Place 1 drop into the left eye 2 (two) times daily. 11/05/21  Yes [provider]  senna-docusate (SENOKOT-S) 8.6-50 MG tablet Take 1 tablet by mouth at bedtime.   Yes [provider]  VENTOLIN HFA 108 (90 Base) MCG/ACT inhaler Inhale 1-2 puffs into the lungs every 6 (six) hours as needed for wheezing or shortness of breath. 12/01/21  Yes [provider]  albuterol (PROVENTIL) (2.5 MG/3ML) 0.083% nebulizer solution Take 3 mLs (2.5 mg total) by nebulization every 6 (six) hours as needed for wheezing or shortness of breath. 11/04/20   Erin Fulling, MD  guaiFENesin-codeine 100-10 MG/5ML syrup Take 5 mLs by mouth every 4 (four) hours as needed for cough. 11/01/20   Danford, Suann Larry, MD  hydroxychloroquine (PLAQUENIL) 200 MG tablet Take 1 tablet (200 mg total) by mouth daily. Patient not taking: Reported on 12/02/2021 11/09/17   Epifanio Lesches, MD  predniSONE (DELTASONE) 20 MG tablet Take 1 tablet  (20 mg total) by mouth daily with breakfast. 10 days Patient not taking: Reported on 12/02/2021 11/04/20   Flora Lipps, MD  Respiratory Therapy Supplies (FLUTTER) DEVI 1 each by Does not apply route 4 (four) times daily. 12/07/17   Flora Lipps, MD   Physical Exam: Vitals:   12/02/21 1430 12/02/21 1500 12/02/21 1600 12/02/21 1700  BP: 95/61 (!) 88/64 (!) 96/58 (!) 103/53  Pulse: 77 76 72 72  Resp: 17 19 19 19   Temp:      TempSrc:      SpO2: 100% 100% 100% 100%  Weight:      Height:       Physical Exam Vitals and nursing note reviewed.  Constitutional:      General: She is not in acute distress.    Appearance: She is normal weight. She is ill-appearing.  HENT:     Head: Normocephalic and atraumatic.     Mouth/Throat:     Mouth: Mucous membranes are dry.     Pharynx: Oropharynx is clear.  Cardiovascular:     Rate and Rhythm: Normal rate and regular rhythm.     Heart sounds: No murmur heard.    No gallop.  Pulmonary:     Effort: Tachypnea present. No respiratory distress.     Breath sounds: Rales (Diffuse inspiratory crackles heard throughout) present. No decreased breath sounds, wheezing or rhonchi.  Abdominal:     General: Bowel sounds are normal. There is no distension.     Palpations: Abdomen is soft.     Tenderness: There is no abdominal tenderness. There is no guarding.  Musculoskeletal:     Right lower leg: No edema.     Left lower leg: No edema.  Skin:    General: Skin is warm and dry.     Comments: Diffuse telangiectasia predominantly located on back and lower extremities.  No sclerodactyly noted.  Neurological:     General: No focal deficit present.     Mental Status: She is alert and oriented to person, place, and time. Mental status is at baseline.     Motor: No weakness.  Psychiatric:        Mood and Affect: Mood normal.        Behavior: Behavior normal.    Data Reviewed: CBC with elevated WBC at 18.1, hemoglobin of 13.2 and platelets of 281.  CMP with  sodium of 134, glucose of 101, BUN of 21, creatinine of 1.24, albumin of 3.2, total protein of 8.3, GFR 46.  Lactic acid within normal limits.  Initial troponin elevated at 35.  COVID-19, influenza and RSV negative.  EKG personally reviewed.  Sinus rhythm with rate of 92.  QTc of 452.  T wave inversions in V2 through V4 that were present on EKG obtained in 2022.  No acute ST or T wave changes concerning for acute ischemia.  CT CHEST WO CONTRAST  Result Date: 12/02/2021 CLINICAL DATA:  Respiratory illness, nondiagnostic xray EXAM: CT CHEST WITHOUT CONTRAST TECHNIQUE: Multidetector CT imaging of the chest was performed following the standard protocol without IV contrast. RADIATION DOSE REDUCTION: This exam was performed according to the departmental dose-optimization  program which includes automated exposure control, adjustment of the mA and/or kV according to patient size and/or use of iterative reconstruction technique. COMPARISON:  Radiograph earlier today. Chest CT 08/31/2020 FINDINGS: Cardiovascular: Moderate aortic atherosclerosis. Fusiform aneurysmal dilatation of the ascending aorta at 4.1 cm. Cardiomegaly. Dilated main pulmonary artery 3.7 cm. Prominent coronary artery calcifications. Mediastinum/Nodes: Lack of IV contrast limits detailed assessment. There is mild shotty multifocal mediastinal adenopathy with slight progression from prior exam. For example an anterior prevascular node measures 11 mm, series 2, image 43, previously 9 mm. Hilar adenopathy on prior exam is suboptimally assessed in the absence of IV contrast. The esophagus is decompressed. Lungs/Pleura: Chronic bronchiectasis, ill-defined multifocal ground-glass opacities and interstitial coarsening consistent with pulmonary fibrosis in a basilar predominant distribution mild progression and fibrotic changes from prior exam. Increased ground-glass in the posterior right lower lobe, for example series 3, image 80. This may represent  progression of chronic disease or superimposed infection. No evidence of pulmonary edema. No pleural fluid. The trachea and central bronchi are patent. Upper Abdomen: No acute findings. Musculoskeletal: There are no acute or suspicious osseous abnormalities. IMPRESSION: 1. Chronic pulmonary fibrosis. Mild progression from prior exam. Increased ground-glass in the posterior right lower lobe may represent progression of chronic disease or superimposed infection. 2. Dilated main pulmonary artery suggesting pulmonary arterial hypertension. 3. Mild shotty multifocal mediastinal adenopathy with slight progression from prior exam, likely reactive. 4. Cardiomegaly.  Coronary artery calcifications 5. 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 Recommend annual imaging followup by CTA or MRA. This recommendation follows 2010 ACCF/AHA/AATS/ACR/ASA/SCA/SCAI/SIR/STS/SVM Guidelines for the Diagnosis and Management of Patients with Thoracic Aortic Disease. Circulation. 2010; [id number]. Aortic aneurysm NOS (ICD10-I71.9) Aortic Atherosclerosis (ICD10-I70.0). Electronically Signed   By: Keith Rake M.D.   On: 12/02/2021 16:36   DG Chest Portable 1 View  Result Date: 12/02/2021 CLINICAL DATA:  Shortness of breath, cough, fever EXAM: PORTABLE CHEST 1 VIEW COMPARISON:  10/30/2020 FINDINGS: Cardiomegaly. Diffuse bilateral interstitial and heterogeneous airspace opacity, similar to prior examination. No focal airspace opacity. IMPRESSION: Cardiomegaly with diffuse bilateral interstitial and heterogeneous airspace opacity, similar to prior examination. This is in keeping with chronic underlying fibrosis, however there may be acutely superimposed edema or infection. CT may be helpful to further evaluate. Electronically Signed   By: Delanna Ahmadi M.D.   On: 12/02/2021 13:36    Results are pending, will review when available.  Assessment and Plan: * Acute on chronic respiratory  failure with hypoxia (HCC) Patient is presenting with 1 week history of malaise, productive cough and increasing shortness of breath with 1 day history of fever.  She normally wears 2 L nasal cannula at home due to underlying ILD, currently requiring 6 L to maintain oxygen saturation above 90%.  CT chest obtained that demonstrates chronic pulmonary fibrosis with mild progression, however there are increased GGO in the posterior lower lobe.  Given clinical presentation, most consistent with acute ILD exacerbation in the setting of CAP. Given high risk of mortality with underlying pulmonary fibrosis, will consult pulmonology. Given history of pulmonary hypertension, will obtain TTE, per discussion with pulmonology.   Patient is not currently septic but at high risk.  - Pulmonology consulted; appreciate their recommendations - Continue supplemental oxygen to maintain oxygen saturation above 88%.  Wean as tolerated - Transition antimicrobials to cefepime (given underlying structural lung disease) and azithromycin - Sputum culture, Legionella urinary antigen, strep pneumo urinary antigen, MRSA PCR and RVP pending - One-time dose  of Solu-Medrol 125 mg - Start prednisone 60 mg tomorrow morning - TTE ordered  Hypotension In the setting of poor p.o. intake with evidence of AKI and hypovolemia on examination.  She is currently received 2 L of IV fluids, unfortunately with lack of significant response.  Will order an additional bolus and continue maintenance fluids.  Lactic acid within normal limits.  -1 L LR followed by 125 cc/h  AKI (acute kidney injury) ([place]) In the setting of poor p.o. intake with hypovolemia on exam.  - IV fluid resuscitation - Repeat BMP in the a.m. - Avoid nephrotoxic agents - Follow urinary output  Demand ischemia Initial troponin elevated at 35.  EKG compared to prior with no significant changes.  Likely in the setting of demand ischemia due to acute on chronic hypoxic  respiratory failure.  -Follow-up repeat troponin  Sjogren's disease [facility]) Patient previously established with Rheumatology, Dr. Eugene Gavia.  Last follow-up was in 2021.  She was previously being treated with Plaquenil, but is not currently taking it at this time.  - Recommend outpatient follow-up with rheumatology  Hypothyroidism - Restart home levothyroxine  Advance Care Planning:   Code Status: Full Code   Consults: Pulmonology  Family Communication: Patient's 2 daughters updated bedside  Severity of Illness: The appropriate patient status for this patient is INPATIENT. Inpatient status is judged to be reasonable and necessary in order to provide the required intensity of service to ensure the patient's safety. The patient's presenting symptoms, physical exam findings, and initial radiographic and laboratory data in the context of their chronic comorbidities is felt to place them at high risk for further clinical deterioration. Furthermore, it is not anticipated that the patient will be medically stable for discharge from the hospital within 2 midnights of admission.   * I certify that at the point of admission it is my clinical judgment that the patient will require inpatient hospital care spanning beyond 2 midnights from the point of admission due to high intensity of service, high risk for further deterioration and high frequency of surveillance required.*  Author: Jose Persia, MD 12/02/2021 5:30 PM  For on call review [email or web address].

## 2021-12-02 NOTE — Assessment & Plan Note (Signed)
Patient previously established with Rheumatology, Dr. Helen Hashimoto.  Last follow-up was in 2021.  She was previously being treated with Plaquenil, but is not currently taking it at this time.  - Recommend outpatient follow-up with rheumatology

## 2021-12-03 DIAGNOSIS — J9601 Acute respiratory failure with hypoxia: Secondary | ICD-10-CM | POA: Diagnosis present

## 2021-12-03 DIAGNOSIS — B348 Other viral infections of unspecified site: Secondary | ICD-10-CM | POA: Insufficient documentation

## 2021-12-03 DIAGNOSIS — J13 Pneumonia due to Streptococcus pneumoniae: Secondary | ICD-10-CM | POA: Insufficient documentation

## 2021-12-03 DIAGNOSIS — I272 Pulmonary hypertension, unspecified: Secondary | ICD-10-CM | POA: Insufficient documentation

## 2021-12-03 LAB — EXPECTORATED SPUTUM ASSESSMENT W GRAM STAIN, RFLX TO RESP C

## 2021-12-03 LAB — COMPREHENSIVE METABOLIC PANEL
ALT: 18 U/L (ref 0–44)
AST: 38 U/L (ref 15–41)
Albumin: 3 g/dL — ABNORMAL LOW (ref 3.5–5.0)
Alkaline Phosphatase: 81 U/L (ref 38–126)
Anion gap: 6 (ref 5–15)
BUN: 22 mg/dL (ref 8–23)
CO2: 24 mmol/L (ref 22–32)
Calcium: 8.8 mg/dL — ABNORMAL LOW (ref 8.9–10.3)
Chloride: 108 mmol/L (ref 98–111)
Creatinine, Ser: 1.14 mg/dL — ABNORMAL HIGH (ref 0.44–1.00)
GFR, Estimated: 51 mL/min — ABNORMAL LOW (ref >60)
Glucose, Bld: 128 mg/dL — ABNORMAL HIGH (ref 70–99)
Potassium: 4.2 mmol/L (ref 3.5–5.1)
Sodium: 138 mmol/L (ref 135–145)
Total Bilirubin: 0.7 mg/dL (ref 0.3–1.2)
Total Protein: 7.6 g/dL (ref 6.5–8.1)

## 2021-12-03 LAB — CBC WITH DIFFERENTIAL/PLATELET
Abs Immature Granulocytes: 0.03 10*3/uL (ref 0.00–0.07)
Basophils Absolute: 0 10*3/uL (ref 0.0–0.1)
Basophils Relative: 0 %
Eosinophils Absolute: 0 10*3/uL (ref 0.0–0.5)
Eosinophils Relative: 0 %
HCT: 35.6 % — ABNORMAL LOW (ref 36.0–46.0)
Hemoglobin: 11.1 g/dL — ABNORMAL LOW (ref 12.0–15.0)
Immature Granulocytes: 0 %
Lymphocytes Relative: 16 %
Lymphs Abs: 1.3 10*3/uL (ref 0.7–4.0)
MCH: 26.7 pg (ref 26.0–34.0)
MCHC: 31.2 g/dL (ref 30.0–36.0)
MCV: 85.8 fL (ref 80.0–100.0)
Monocytes Absolute: 0.2 10*3/uL (ref 0.1–1.0)
Monocytes Relative: 3 %
Neutro Abs: 6.3 10*3/uL (ref 1.7–7.7)
Neutrophils Relative %: 81 %
Platelets: 216 10*3/uL (ref 150–400)
RBC: 4.15 MIL/uL (ref 3.87–5.11)
RDW: 16.8 % — ABNORMAL HIGH (ref 11.5–15.5)
WBC: 7.8 10*3/uL (ref 4.0–10.5)
nRBC: 0 % (ref 0.0–0.2)

## 2021-12-03 LAB — ECHOCARDIOGRAM COMPLETE
Area-P 1/2: 4.26 cm2
Height: 61 in
P 1/2 time: 369 msec
S' Lateral: 2.2 cm
Weight: 2080 oz

## 2021-12-03 MED ORDER — SODIUM CHLORIDE 0.9 % IV SOLN
2.0000 g | INTRAVENOUS | Status: DC
  Administered 2021-12-03 – 2021-12-06 (×4): 2 g via INTRAVENOUS
  Filled 2021-12-03: qty 20
  Filled 2021-12-03 (×3): qty 2
  Filled 2021-12-03 (×2): qty 20

## 2021-12-03 NOTE — Assessment & Plan Note (Signed)
--  supportive care ?

## 2021-12-03 NOTE — Assessment & Plan Note (Addendum)
--  current Echo showed Right ventricular systolic function is mildly reduced. The right  ventricular size is severely enlarged. There is severely elevated pulmonary artery systolic pressure. The estimated right ventricular  systolic pressure is 65.8 mmHg.  --cardiology consulted, who deferred RHC to outpatient after infection is treated. --if RV fails can consider inotropes  --no need for diuresis, per cardio

## 2021-12-03 NOTE — Progress Notes (Signed)
PROGRESS NOTE    Denise [name]  [id number] DOB: March 03, 1945 DOA: 12/02/2021 PCP: Denise Perches, PA-C  212A/212A-AA  LOS: 1 day   Brief hospital course:   Assessment & Plan: Denise [name] is a 76 y.o. 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     * Acute on chronic respiratory failure with hypoxia (HCC) Patient is presenting with 1 week history of malaise, productive cough and increasing shortness of breath with 1 day history of fever.  She normally wears 2 L nasal cannula at home due to underlying ILD, currently requiring 6 L to maintain oxygen saturation above 90%.   --2/2 rhino/enteroviral infection, strep pneumo PNA, worsening pulm HTN --pulm consulted, deemed not a ILD exacerbation, so steroid not continued.  Hypotension In the setting of poor p.o. intake with evidence of AKI and hypovolemia on examination.   --BP improved with IVF  AKI (acute kidney injury) ([place]) In the setting of poor p.o. intake with hypovolemia on exam. --Cr 1.24 on presentation --hold further IVF   Demand ischemia Trop mildly elevated at 35 and 34.  Sjogren's disease [facility]) Patient previously established with Rheumatology, Dr. Eugene [name].  Last follow-up was in 2021.  She was previously being treated with Plaquenil, but is not currently taking it at this time.  - Recommend outpatient follow-up with rheumatology  Hypothyroidism - cont home levothyroxine  Severe pulmonary hypertension ([place]) --current Echo showed Right ventricular systolic function is mildly reduced. The right  ventricular size is severely enlarged. There is severely elevated pulmonary artery systolic pressure. The estimated right  ventricular  systolic pressure is [id number] mmHg.  --cardiology consulted, who deferred RHC to outpatient after infection is treated. --if RV fails can consider inotropes   Rhinovirus infection --supportive care  Streptococcus pneumoniae pneumonia ([place]) --received vanc/cefepime and azithromycin on presentation --switch to ceftriaxone   DVT prophylaxis: Lovenox SQ Code Status: Full code  Family Communication: daughter updated at bedside today Level of care: Med-Surg Dispo:   The patient is from: home Anticipated d/c is to: home Anticipated d/c date is: 2-3 days   Subjective and Interval History:  iPad translator used.  Respiratory status stable.     Objective: Vitals:   12/03/21 1200 12/03/21 1345 12/03/21 1506 12/03/21 1923  BP:  135/72 125/69 129/88  Pulse:  72 73 68  Resp:  (!) 21 19 20   Temp: 97.7 F (36.5 C) 97.8 F (36.6 C) 98.8 F (37.1 C) 98.3 F (36.8 C)  TempSrc:   Oral Oral  SpO2:  96% 96% 100%  Weight:      Height:        Intake/Output Summary (Last 24 hours) at 12/03/2021 2019 Last data filed at 12/03/2021 1107 Gross per 24 hour  Intake 200 ml  Output --  Net 200 ml   Filed Weights   12/02/21 1246 12/02/21 1349  Weight: 59 kg 59 kg    Examination:   Constitutional: NAD, AAOx3 HEENT: conjunctivae and lids normal, EOMI CV: No cyanosis.   RESP: normal respiratory effort, on 3L Neuro: II - XII grossly intact.   Psych: Normal mood and affect.  Appropriate judgement and reason   Data Reviewed: I have personally reviewed labs and imaging studies  Time spent: 50 minutes  Denise [name]  Denise Lowes, MD [facility] If 7PM-7AM, please contact night-coverage 12/03/2021, 8:19 PM

## 2021-12-03 NOTE — ED Notes (Signed)
Pt's daughter given a recliner for comfort and warm blankets. Pt appears to be resting, observe even RR and unlabored, NAD noted, side rails up x2 for safety, plan of care ongoing, call light within reach, no further concerns as of present.

## 2021-12-03 NOTE — Assessment & Plan Note (Addendum)
--  received vanc/cefepime and azithromycin on presentation, de-escalated to ceftriaxone --cont ceftriaxone, today day 5

## 2021-12-03 NOTE — ED Notes (Signed)
RN aware bed assigned ?

## 2021-12-03 NOTE — ED Notes (Signed)
Ambulatory to bathroom with family assistance

## 2021-12-04 DIAGNOSIS — B348 Other viral infections of unspecified site: Secondary | ICD-10-CM

## 2021-12-04 DIAGNOSIS — J13 Pneumonia due to Streptococcus pneumoniae: Principal | ICD-10-CM

## 2021-12-04 LAB — CBC
HCT: 37.9 % (ref 36.0–46.0)
Hemoglobin: 11.9 g/dL — ABNORMAL LOW (ref 12.0–15.0)
MCH: 26.7 pg (ref 26.0–34.0)
MCHC: 31.4 g/dL (ref 30.0–36.0)
MCV: 85 fL (ref 80.0–100.0)
Platelets: 240 10*3/uL (ref 150–400)
RBC: 4.46 MIL/uL (ref 3.87–5.11)
RDW: 17.1 % — ABNORMAL HIGH (ref 11.5–15.5)
WBC: 10.2 10*3/uL (ref 4.0–10.5)
nRBC: 0 % (ref 0.0–0.2)

## 2021-12-04 LAB — BASIC METABOLIC PANEL
Anion gap: 6 (ref 5–15)
BUN: 26 mg/dL — ABNORMAL HIGH (ref 8–23)
CO2: 25 mmol/L (ref 22–32)
Calcium: 9.2 mg/dL (ref 8.9–10.3)
Chloride: 106 mmol/L (ref 98–111)
Creatinine, Ser: 1.38 mg/dL — ABNORMAL HIGH (ref 0.44–1.00)
GFR, Estimated: 40 mL/min — ABNORMAL LOW (ref >60)
Glucose, Bld: 98 mg/dL (ref 70–99)
Potassium: 3.8 mmol/L (ref 3.5–5.1)
Sodium: 137 mmol/L (ref 135–145)

## 2021-12-04 LAB — MAGNESIUM: Magnesium: 2 mg/dL (ref 1.7–2.4)

## 2021-12-04 LAB — BRAIN NATRIURETIC PEPTIDE: B Natriuretic Peptide: 1433.3 pg/mL — ABNORMAL HIGH (ref 0.0–100.0)

## 2021-12-04 MED ORDER — IPRATROPIUM-ALBUTEROL 0.5-2.5 (3) MG/3ML IN SOLN
3.0000 mL | Freq: Four times a day (QID) | RESPIRATORY_TRACT | Status: DC
  Administered 2021-12-04 – 2021-12-07 (×11): 3 mL via RESPIRATORY_TRACT
  Filled 2021-12-04 (×11): qty 3

## 2021-12-04 MED ORDER — IPRATROPIUM-ALBUTEROL 0.5-2.5 (3) MG/3ML IN SOLN: 3.0000 mL | Freq: Four times a day (QID) | RESPIRATORY_TRACT | Status: DC

## 2021-12-04 MED ORDER — ENOXAPARIN SODIUM 30 MG/0.3ML IJ SOSY
30.0000 mg | PREFILLED_SYRINGE | INTRAMUSCULAR | Status: DC
  Administered 2021-12-04: 30 mg via SUBCUTANEOUS
  Filled 2021-12-04: qty 0.3

## 2021-12-04 MED ORDER — GUAIFENESIN-DM 100-10 MG/5ML PO SYRP
5.0000 mL | ORAL_SOLUTION | ORAL | Status: DC | PRN
  Administered 2021-12-04 – 2021-12-06 (×7): 5 mL via ORAL
  Filled 2021-12-04 (×8): qty 10

## 2021-12-04 MED ORDER — ACETAMINOPHEN 325 MG PO TABS
650.0000 mg | ORAL_TABLET | Freq: Four times a day (QID) | ORAL | Status: DC | PRN
  Administered 2021-12-04 – 2021-12-05 (×3): 650 mg via ORAL
  Filled 2021-12-04 (×3): qty 2

## 2021-12-04 NOTE — Progress Notes (Signed)
Bedside report received  

## 2021-12-04 NOTE — Plan of Care (Signed)

## 2021-12-04 NOTE — Consult Note (Signed)
Cardiology Consultation   Patient ID: Denise [name] MRN: [id number]; DOB: 03/04/1945  Admit date: 12/02/2021 Date of Consult: 12/04/2021  PCP:  Marya Fossa, PA-C   [facility] Providers Cardiologist:  None        Patient Profile:   Denise [name] is a 76 y.o. 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 Fran Lowes.  History of Present Illness:   The patient's son was present in the room during my interview, and the interpretation service via video was used, assisted by Ezzard Standing.  Patient endorses that she has been in her usual state of health until the last week. She has been working to eat better over the last 6 months, increasing her home weight from 103 lbs to 130 lbs. She notes occasional leg swelling, but this is rare. When asked how her breathing was a month ago, she reports that it was stable without any new changes.  One week ago, she noted increased cough with phlegm, worsening shortness of breath, and fever. No LE edema. Weight was stable. Since presenting to the hospital, she feels slightly better but is not at her baseline.  Reviewed that her hospital workup shows +rhinovirus and strep pneumonia. She continues to have cough and sputum. She also has an increased oxygen requirement.  No chest pain, no syncope, no palpitations   Past Medical History:  Diagnosis Date   Hypertension    Pulmonary fibrosis (HCC)    Pulmonary hypertension (HCC)    a. TTE 10/19:  EF of 65-70%, mild LVH, no RWMA, normal LV diastolic function, mildly dilated LA, RVSF cavity size normal with normal RVSF, mildly dilated RA, PASP 45 mmHg, dilated IVC consistent with elevated CVP, trivial pericardial effusion was noted   Sjogren's disease (HCC)    Thyroid disease     History reviewed. No pertinent surgical history.     Inpatient  Medications: Scheduled Meds:  atorvastatin  20 mg Oral Daily   enoxaparin (LOVENOX) injection  30 mg Subcutaneous Q24H   levothyroxine  100 mcg Oral Q0600   prednisoLONE acetate  1 [name] Left Eye BID   Continuous Infusions:  cefTRIAXone (ROCEPHIN)  IV 2 g (12/03/21 2116)   PRN Meds: acetaminophen, albuterol, guaiFENesin-dextromethorphan  Allergies:    Allergies  Allergen Reactions   Oxycodone Itching    Social History:   Social History   Socioeconomic History   Marital status: Widowed    Spouse name: Not on file   Number of children: Not on file   Years of education: Not on file   Highest education level: Not on file  Occupational History   Not on file  Tobacco Use   Smoking status: Former   Smokeless tobacco: Never  Vaping Use   Vaping Use: Never used  Substance and Sexual Activity   Alcohol use: No   Drug use: No   Sexual activity: Never  Other Topics Concern   Not on file  Social History Narrative   Not on file   Social Determinants of Health   Financial Resource Strain: Not on file  Food Insecurity: No Food Insecurity (12/03/2021)   Hunger Vital Sign    Worried About Running Out of Food in the Last Year: Never true    Ran Out of Food in the Last Year: Never true  Transportation Needs: No Transportation Needs (12/03/2021)   PRAPARE -  Administrator, Civil ServiceTransportation    Lack of Transportation (Medical): No    Lack of Transportation (Non-Medical): No  Physical Activity: Not on file  Stress: Not on file  Social Connections: Not on file  Intimate Partner Violence: Not At Risk (12/03/2021)   Humiliation, Afraid, Rape, and Kick questionnaire    Fear of Current or Ex-Partner: No    Emotionally Abused: No    Physically Abused: No    Sexually Abused: No    Family History:   History reviewed. No pertinent family history.   ROS:  Please see the history of present illness.  Constitutional: Positive for fevers  HENT: Negative for ear pain and hearing loss.   Eyes: Negative  for loss of vision and eye pain.  Respiratory: Positive for cough, sputum, wheezing.   Cardiovascular: See HPI. Gastrointestinal: Negative for abdominal pain, melena, and hematochezia.  Genitourinary: Negative for dysuria and hematuria.  Musculoskeletal: Negative for falls and myalgias.  Skin: Negative for itching and rash.  Neurological: Negative for focal weakness, focal sensory changes and loss of consciousness.  All other ROS reviewed and negative.     Physical Exam/Data:   Vitals:   12/03/21 1506 12/03/21 1923 12/04/21 0411 12/04/21 0843  BP: 125/69 129/88 106/61 117/76  Pulse: 73 68 75 67  Resp: 19 20 18 17   Temp: 98.8 F (37.1 C) 98.3 F (36.8 C) 98.5 F (36.9 C) 98.6 F (37 C)  TempSrc: Oral Oral Oral Oral  SpO2: 96% 100% 99% 100%  Weight:      Height:        Intake/Output Summary (Last 24 hours) at 12/04/2021 1057 Last data filed at 12/03/2021 1107 Gross per 24 hour  Intake 100 ml  Output --  Net 100 ml      12/02/2021    1:49 PM 12/02/2021   12:46 PM 11/04/2020   10:47 AM  Last 3 Weights  Weight (lbs) 130 lb 130 lb 139 lb 12.8 oz  Weight (kg) 58.968 kg 58.968 kg 63.413 kg     Body mass index is 24.56 kg/m.  General:  Frail appearing woman, Denise [name] in place, frequently coughing HEENT: normal Neck: no JVD sitting upright Vascular: No carotid bruits; Distal pulses 2+ bilaterally Cardiac:  normal S1, S2; prominent P2, 1/6 systolic murmur Lungs:  diffusely coarse with velcro crackles most prominent at bases, +wheezing Abd: soft, nontender, no hepatomegaly  Ext: no edema Musculoskeletal:  No deformities, BUE and BLE strength normal and equal Skin: warm and dry  Neuro:  no focal abnormalities noted Psych:  Normal affect   EKG:  The EKG was personally reviewed and demonstrates:  NSR at 92 bpm, nonspecific ST pattern, right axis deviation Telemetry:  not currently on telemetry  Relevant CV Studies: See echo comparison summary below  Laboratory  Data:  High Sensitivity Troponin:   Recent Labs  Lab 12/02/21 1320 12/02/21 1504  TROPONINIHS 35* 34*     Chemistry Recent Labs  Lab 12/02/21 1320 12/03/21 0636 12/04/21 0427  NA 134* 138 137  K 4.0 4.2 3.8  CL 100 108 106  CO2 23 24 25   GLUCOSE 101* 128* 98  BUN 21 22 26*  CREATININE 1.24* 1.14* 1.38*  CALCIUM 9.3 8.8* 9.2  MG  --   --  2.0  GFRNONAA 46* 51* 40*  ANIONGAP 11 6 6     Recent Labs  Lab 12/02/21 1320 12/03/21 0636  PROT 8.3* 7.6  ALBUMIN 3.2* 3.0*  AST 35 38  ALT 13 18  ALKPHOS 89  81  BILITOT 1.1 0.7   Lipids No results for input(s): "CHOL", "TRIG", "HDL", "LABVLDL", "LDLCALC", "CHOLHDL" in the last 168 hours.  Hematology Recent Labs  Lab 12/02/21 1320 12/03/21 0521 12/04/21 0427  WBC 18.1* 7.8 10.2  RBC 4.96 4.15 4.46  HGB 13.2 11.1* 11.9*  HCT 41.3 35.6* 37.9  MCV 83.3 85.8 85.0  MCH 26.6 26.7 26.7  MCHC 32.0 31.2 31.4  RDW 16.9* 16.8* 17.1*  PLT 281 216 240   Thyroid No results for input(s): "TSH", "FREET4" in the last 168 hours.  BNP Recent Labs  Lab 12/04/21 0427  BNP 1,433.3*    DDimer No results for input(s): "DDIMER" in the last 168 hours.   Radiology/Studies:  ECHOCARDIOGRAM COMPLETE  Result Date: 12/03/2021    ECHOCARDIOGRAM REPORT   Patient Name:   Denise [name] Date of Exam: 12/02/2021 Medical Rec #:  [id number]                    Height:       61.0 in Accession #:    [id number]                   Weight:       130.0 lb Date of Birth:  [birth year]                     BSA:          1.573 m Patient Age:    76 years                     BP:           102/68 mmHg Patient Gender: F                            HR:           69 bpm. Exam Location:  [facility] Procedure: 2D Echo, Cardiac Doppler and Color Doppler Indications:     I27.2 Pulmonary hypertension  History:         Patient has no prior history of Echocardiogram examinations.  Sonographer:     Daphine Deutscher RDCS Referring Phys:  [id number] Verdene Lennert  Diagnosing Phys: Debbe Odea MD IMPRESSIONS  1. Left ventricular ejection fraction, by estimation, is 60 to 65%. The left ventricle has normal function. The left ventricle has no regional wall motion abnormalities. Left ventricular diastolic parameters are consistent with Grade I diastolic dysfunction (impaired relaxation). There is the interventricular septum is flattened in diastole ('D' shaped left ventricle), consistent with right ventricular volume overload.  2. Right ventricular systolic function is mildly reduced. The right ventricular size is severely enlarged. There is severely elevated pulmonary artery systolic pressure. The estimated right ventricular systolic pressure is 65.8 mmHg.  3. Left atrial size was severely dilated.  4. The mitral valve is normal in structure. Mild mitral valve regurgitation.  5. Tricuspid valve regurgitation is moderate.  6. The aortic valve is tricuspid. Aortic valve regurgitation is mild. Aortic valve sclerosis is present, with no evidence of aortic valve stenosis.  7. Aortic dilatation noted. There is mild dilatation of the ascending aorta, measuring 43 mm.  8. The inferior vena cava is normal in size with <50% respiratory variability, suggesting right atrial pressure of 8 mmHg. FINDINGS  Left Ventricle: Left ventricular ejection fraction, by estimation, is 60 to 65%. The left ventricle has normal function. The left ventricle has  no regional wall motion abnormalities. The left ventricular internal cavity size was normal in size. There is  no left ventricular hypertrophy. The interventricular septum is flattened in diastole ('D' shaped left ventricle), consistent with right ventricular volume overload. Left ventricular diastolic parameters are consistent with Grade I diastolic dysfunction  (impaired relaxation). Right Ventricle: The right ventricular size is severely enlarged. No increase in right ventricular wall thickness. Right ventricular systolic function is mildly  reduced. There is severely elevated pulmonary artery systolic pressure. The tricuspid regurgitant velocity is 3.80 m/s, and with an assumed right atrial pressure of 8 mmHg, the estimated right ventricular systolic pressure is 65.8 mmHg. Left Atrium: Left atrial size was severely dilated. Right Atrium: Right atrial size was normal in size. Pericardium: There is no evidence of pericardial effusion. Mitral Valve: The mitral valve is normal in structure. Mild mitral valve regurgitation. Tricuspid Valve: The tricuspid valve is normal in structure. Tricuspid valve regurgitation is moderate. Aortic Valve: The aortic valve is tricuspid. Aortic valve regurgitation is mild. Aortic regurgitation PHT measures 369 msec. Aortic valve sclerosis is present, with no evidence of aortic valve stenosis. Pulmonic Valve: The pulmonic valve was normal in structure. Pulmonic valve regurgitation is mild to moderate. Aorta: Aortic dilatation noted. There is mild dilatation of the ascending aorta, measuring 43 mm. Venous: The inferior vena cava is normal in size with less than 50% respiratory variability, suggesting right atrial pressure of 8 mmHg. IAS/Shunts: No atrial level shunt detected by color flow Doppler.  LEFT VENTRICLE PLAX 2D LVIDd:         3.70 cm   Diastology LVIDs:         2.20 cm   LV e' medial:    6.85 cm/s LV PW:         0.70 cm   LV E/e' medial:  9.2 LV IVS:        0.70 cm   LV e' lateral:   10.00 cm/s LVOT diam:     2.10 cm   LV E/e' lateral: 6.3 LV SV:         71 LV SV Index:   45 LVOT Area:     3.46 cm  RIGHT VENTRICLE            IVC RV Basal diam:  5.00 cm    IVC diam: 1.90 cm RV S prime:     9.17 cm/s TAPSE (M-mode): 1.4 cm LEFT ATRIUM             Index        RIGHT ATRIUM           Index LA diam:        4.10 cm 2.61 cm/m   RA Area:     25.20 cm LA Vol (A2C):   41.5 ml 26.39 ml/m  RA Volume:   93.70 ml  59.58 ml/m LA Vol (A4C):   35.7 ml 22.70 ml/m LA Biplane Vol: 38.7 ml 24.61 ml/m  AORTIC VALVE LVOT Vmax:    104.67 cm/s LVOT Vmean:  65.133 cm/s LVOT VTI:    0.204 m AI PHT:      369 msec  AORTA Ao Root diam: 3.40 cm Ao Asc diam:  4.30 cm MITRAL VALVE               TRICUSPID VALVE MV Area (PHT): 4.26 cm    TR Peak grad:   57.8 mmHg MV Decel Time: 178 msec    TR Vmax:  380.00 cm/s MV E velocity: 62.90 cm/s MV A velocity: 81.50 cm/s  SHUNTS MV E/A ratio:  0.77        Systemic VTI:  0.20 m                            Systemic Diam: 2.10 cm Debbe Odea MD Electronically signed by Debbe Odea MD Signature Date/Time: 12/03/2021/11:28:08 AM    Final    CT Angio Chest Pulmonary Embolism (PE) W or WO Contrast  Result Date: 12/02/2021 CLINICAL DATA:  Cough, fever, and shortness of breath. EXAM: CT ANGIOGRAPHY CHEST WITH CONTRAST TECHNIQUE: Multidetector CT imaging of the chest was performed using the standard protocol during bolus administration of intravenous contrast. Multiplanar CT image reconstructions and MIPs were obtained to evaluate the vascular anatomy. RADIATION DOSE REDUCTION: This exam was performed according to the departmental dose-optimization program which includes automated exposure control, adjustment of the mA and/or kV according to patient size and/or use of iterative reconstruction technique. CONTRAST:  75mL OMNIPAQUE IOHEXOL 350 MG/ML SOLN COMPARISON:  CT chest from same day. CTA chest dated August 31, 2020. FINDINGS: Cardiovascular: Satisfactory opacification of the pulmonary arteries to the segmental level. No evidence of pulmonary embolism. Unchanged dilated main pulmonary artery measuring up to 3.7 cm in diameter. Unchanged cardiomegaly. No pericardial effusion. No thoracic aortic aneurysm or dissection. Coronary, aortic arch, and branch vessel atherosclerotic vascular disease. Mediastinum/Nodes: Unchanged mildly enlarged mediastinal and bilateral hilar lymph nodes. No enlarged axillary lymph nodes. The thyroid gland, trachea, and esophagus demonstrate no significant findings.  Lungs/Pleura: Similar multifocal ground-glass densities, coarse septal thickening, subpleural reticulation, and traction bronchiectasis in both lungs, basilar predominant. Somewhat more confluent consolidation in both lower lobes, similar to today's chest CT, increased since August 2022. No pleural effusion or pneumothorax. Upper Abdomen: No acute abnormality. Musculoskeletal: No chest wall abnormality. No acute or significant osseous findings. Review of the MIP images confirms the above findings. IMPRESSION: 1. No evidence of pulmonary embolism. 2. Chronic pulmonary fibrosis with somewhat more confluent consolidation in both lower lobes, similar to today's chest CT, increased since August 2022. This could reflect interstitial lung disease progression or superimposed pneumonia. 3. Unchanged mildly enlarged mediastinal and bilateral hilar lymph nodes, likely reactive. 4. Unchanged dilated main pulmonary artery, consistent with pulmonary arterial hypertension. 5.  Aortic atherosclerosis (ICD10-I70.0). Electronically Signed   By: Obie Dredge M.D.   On: 12/02/2021 18:54   CT CHEST WO CONTRAST  Result Date: 12/02/2021 CLINICAL DATA:  Respiratory illness, nondiagnostic xray EXAM: CT CHEST WITHOUT CONTRAST TECHNIQUE: Multidetector CT imaging of the chest was performed following the standard protocol without IV contrast. RADIATION DOSE REDUCTION: This exam was performed according to the departmental dose-optimization program which includes automated exposure control, adjustment of the mA and/or kV according to patient size and/or use of iterative reconstruction technique. COMPARISON:  Radiograph earlier today. Chest CT 08/31/2020 FINDINGS: Cardiovascular: Moderate aortic atherosclerosis. Fusiform aneurysmal dilatation of the ascending aorta at 4.1 cm. Cardiomegaly. Dilated main pulmonary artery 3.7 cm. Prominent coronary artery calcifications. Mediastinum/Nodes: Lack of IV contrast limits detailed assessment. There  is mild shotty multifocal mediastinal adenopathy with slight progression from prior exam. For example an anterior prevascular node measures 11 mm, series 2, image 43, previously 9 mm. Hilar adenopathy on prior exam is suboptimally assessed in the absence of IV contrast. The esophagus is decompressed. Lungs/Pleura: Chronic bronchiectasis, ill-defined multifocal ground-glass opacities and interstitial coarsening consistent with pulmonary fibrosis in a basilar predominant distribution  mild progression and fibrotic changes from prior exam. Increased ground-glass in the posterior right lower lobe, for example series 3, image 80. This may represent progression of chronic disease or superimposed infection. No evidence of pulmonary edema. No pleural fluid. The trachea and central bronchi are patent. Upper Abdomen: No acute findings. Musculoskeletal: There are no acute or suspicious osseous abnormalities. IMPRESSION: 1. Chronic pulmonary fibrosis. Mild progression from prior exam. Increased ground-glass in the posterior right lower lobe may represent progression of chronic disease or superimposed infection. 2. Dilated main pulmonary artery suggesting pulmonary arterial hypertension. 3. Mild shotty multifocal mediastinal adenopathy with slight progression from prior exam, likely reactive. 4. Cardiomegaly.  Coronary artery calcifications 5. 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 Recommend annual imaging followup by CTA or MRA. This recommendation follows 2010 ACCF/AHA/AATS/ACR/ASA/SCA/SCAI/SIR/STS/SVM Guidelines for the Diagnosis and Management of Patients with Thoracic Aortic Disease. Circulation. 2010; 121: V697-X480. Aortic aneurysm NOS (ICD10-I71.9) Aortic Atherosclerosis (ICD10-I70.0). Electronically Signed   By: Narda Rutherford M.D.   On: 12/02/2021 16:36   DG Chest Portable 1 View  Result Date: 12/02/2021 CLINICAL DATA:  Shortness of breath, cough, fever EXAM:  PORTABLE CHEST 1 VIEW COMPARISON:  10/30/2020 FINDINGS: Cardiomegaly. Diffuse bilateral interstitial and heterogeneous airspace opacity, similar to prior examination. No focal airspace opacity. IMPRESSION: Cardiomegaly with diffuse bilateral interstitial and heterogeneous airspace opacity, similar to prior examination. This is in keeping with chronic underlying fibrosis, however there may be acutely superimposed edema or infection. CT may be helpful to further evaluate. Electronically Signed   By: Jearld Lesch M.D.   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 Now acutely requiring 6lpm given respiratory infection -given history of worsening shortness of breath, malaise, cough, and fever with positive rhinovirus and strep pneumonia, suspect this current presentation was triggered by infection -she appears grossly euvolemic, and her Cr and BUN are above baseline. Would not diurese at this time -agree with prior recommendations to recover from acute illness prior to performing further evaluation. She has an outpatient appt with Dr. Gala Romney in advanced heart failure on 12/1. Outpatient right heart cath can be discussed at that time -no indication for inotropes at this time -antibiotics per primary team -not on immunosuppression currently or as an outpatient. Defer to pulmonology on this given her ILD/autoimmune disease  Echo history: images personally reviewed 12/02/21: PASP 66, severely enlarged RV size with mildly reduced function, RAP 8 mmHg 10/31/20: PASP 56, enlarged RV size with normal function, RAP 3 mmHg 11/07/17: PASP 45, normal RV size/function, RAP 15  mmHg  Risk Assessment/Risk Scores:        New York Heart Association (NYHA) Functional Class NYHA Class IV    [facility] will sign off.   Medication Recommendations:  no changes Other recommendations (labs, testing, etc):  none Follow up as an outpatient:  She has outpatient follow up to establish care with Dr. Gala Romney on 12/12/21. Further workup can be discussed at that time.  For questions or updates, please contact [facility] Please consult [email or web address] for contact info under    Signed, Jodelle Red, MD  12/04/2021 10:57 AM

## 2021-12-04 NOTE — Progress Notes (Signed)
Pt's O2 was titrated to 2L [place] (which is pt's baseline) and SpO2 ranged from 95%-97%. MD was notified.

## 2021-12-04 NOTE — Progress Notes (Signed)
  PROGRESS NOTE    Denise [name]  CLE:[id number] DOB: February 13, 1945 DOA: 12/02/2021 PCP: Marya Fossa, PA-C  212A/[id number]-AA  LOS: 2 days   Brief hospital course:   Assessment & Plan: Denise [name] is a 76 y.o. 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     * Acute on chronic respiratory failure with hypoxia (HCC) Patient is presenting with 1 week history of malaise, productive cough and increasing shortness of breath with 1 day history of fever.  She normally wears 2 L nasal cannula at home due to underlying ILD, currently requiring 6 L to maintain oxygen saturation above 90%.   --2/2 rhino/enteroviral infection, strep pneumo PNA, worsening pulm HTN --pulm consulted, deemed not a ILD exacerbation, so steroid not continued.  Hypotension In the setting of poor p.o. intake with evidence of AKI and hypovolemia on examination.   --BP improved with IVF  AKI (acute kidney injury) (HCC) In the setting of poor p.o. intake with hypovolemia on exam. --Cr 1.24 on presentation --hold further IVF   Demand ischemia Trop mildly elevated at 35 and 34.  Sjogren's disease [facility]) Patient previously established with Rheumatology, Dr. Helen Hashimoto.  Last follow-up was in 2021.  She was previously being treated with Plaquenil, but is not currently taking it at this time.  - Recommend outpatient follow-up with rheumatology  Hypothyroidism - cont home levothyroxine  Severe pulmonary hypertension (HCC) --current Echo showed Right ventricular systolic function is mildly reduced. The right  ventricular size is severely enlarged. There is severely elevated pulmonary artery systolic pressure. The estimated right  ventricular  systolic pressure is 65.8 mmHg.  --cardiology consulted, who deferred RHC to outpatient after infection is treated. --if RV fails can consider inotropes  --no need for diuresis, per cardio  Rhinovirus infection --supportive care  Streptococcus pneumoniae pneumonia (HCC) --received vanc/cefepime and azithromycin on presentation, de-escalated to ceftriaxone --cont ceftriaxone   DVT prophylaxis: Lovenox SQ Code Status: Full code  Family Communication: son updated at bedside today Level of care: Med-Surg Dispo:   The patient is from: home Anticipated d/c is to: home Anticipated d/c date is: 2-3 days   Subjective and Interval History:  Pt reported feeling weak.  Still coughing.   Objective: Vitals:   12/04/21 1557 12/04/21 1753 12/04/21 1934 12/04/21 2020  BP:    115/63  Pulse:    79  Resp:    20  Temp:      TempSrc:      SpO2: 96% 97% 96% 97%  Weight:      Height:       No intake or output data in the 24 hours ending 12/04/21 2112  Filed Weights   12/02/21 1246 12/02/21 1349  Weight: 59 kg 59 kg    Examination:   Constitutional: NAD, AAOx3 HEENT: conjunctivae and lids normal, EOMI CV: No cyanosis.   RESP: diffuse rhonchi, wheezes and crackles Neuro: II - XII grossly intact.     Data Reviewed: I have personally reviewed labs and imaging studies  Time spent: 35 minutes  Darlin Priestly, MD [facility] If 7PM-7AM, please contact night-coverage 12/04/2021, 9:12 PM

## 2021-12-05 LAB — BASIC METABOLIC PANEL
Anion gap: 7 (ref 5–15)
BUN: 20 mg/dL (ref 8–23)
CO2: 26 mmol/L (ref 22–32)
Calcium: 8.7 mg/dL — ABNORMAL LOW (ref 8.9–10.3)
Chloride: 106 mmol/L (ref 98–111)
Creatinine, Ser: 1.1 mg/dL — ABNORMAL HIGH (ref 0.44–1.00)
GFR, Estimated: 53 mL/min — ABNORMAL LOW (ref >60)
Glucose, Bld: 95 mg/dL (ref 70–99)
Potassium: 3.2 mmol/L — ABNORMAL LOW (ref 3.5–5.1)
Sodium: 139 mmol/L (ref 135–145)

## 2021-12-05 LAB — CBC
HCT: 37.7 % (ref 36.0–46.0)
Hemoglobin: 11.8 g/dL — ABNORMAL LOW (ref 12.0–15.0)
MCH: 26.6 pg (ref 26.0–34.0)
MCHC: 31.3 g/dL (ref 30.0–36.0)
MCV: 85.1 fL (ref 80.0–100.0)
Platelets: 244 10*3/uL (ref 150–400)
RBC: 4.43 MIL/uL (ref 3.87–5.11)
RDW: 16.7 % — ABNORMAL HIGH (ref 11.5–15.5)
WBC: 9.6 10*3/uL (ref 4.0–10.5)
nRBC: 0 % (ref 0.0–0.2)

## 2021-12-05 LAB — CULTURE, RESPIRATORY W GRAM STAIN

## 2021-12-05 LAB — LEGIONELLA PNEUMOPHILA SEROGP 1 UR AG: L. pneumophila Serogp 1 Ur Ag: NEGATIVE

## 2021-12-05 LAB — MAGNESIUM: Magnesium: 2 mg/dL (ref 1.7–2.4)

## 2021-12-05 MED ORDER — ENOXAPARIN SODIUM 40 MG/0.4ML IJ SOSY
40.0000 mg | PREFILLED_SYRINGE | INTRAMUSCULAR | Status: DC
  Administered 2021-12-05: 40 mg via SUBCUTANEOUS
  Filled 2021-12-05 (×2): qty 0.4

## 2021-12-05 MED ORDER — SODIUM CHLORIDE 0.9 % IV SOLN: INTRAVENOUS | Status: DC | PRN

## 2021-12-05 MED ORDER — POTASSIUM CHLORIDE CRYS ER 20 MEQ PO TBCR
40.0000 meq | EXTENDED_RELEASE_TABLET | Freq: Once | ORAL | Status: AC
  Administered 2021-12-05: 40 meq via ORAL
  Filled 2021-12-05: qty 2

## 2021-12-05 NOTE — Plan of Care (Signed)
  Problem: Education: Goal: Knowledge of General Education information will improve Description: Including pain rating scale, medication(s)/side effects and non-pharmacologic comfort measures 12/05/2021 1059 by Rigoberto Noel, RN Outcome: Progressing 12/05/2021 1059 by Rigoberto Noel, RN Outcome: Progressing   Problem: Health Behavior/Discharge Planning: Goal: Ability to manage health-related needs will improve 12/05/2021 1059 by Rigoberto Noel, RN Outcome: Progressing 12/05/2021 1059 by Rigoberto Noel, RN Outcome: Progressing   Problem: Clinical Measurements: Goal: Ability to maintain clinical measurements within normal limits will improve 12/05/2021 1059 by Rigoberto Noel, RN Outcome: Progressing 12/05/2021 1059 by Rigoberto Noel, RN Outcome: Progressing Goal: Will remain free from infection Outcome: Progressing   Problem: Activity: Goal: Risk for activity intolerance will decrease Outcome: Progressing   Problem: Nutrition: Goal: Adequate nutrition will be maintained Outcome: Progressing

## 2021-12-05 NOTE — Plan of Care (Signed)

## 2021-12-05 NOTE — Progress Notes (Signed)
PROGRESS NOTE    Denise [name]  [id number]:[id number] DOB: [birth year] DOA: 12/02/2021 PCP: Marya Fossa, PA-C  212A/212A-AA  LOS: 3 days   Brief hospital course:   Assessment & Plan: Denise [name] is a 76 y.o. 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     * Acute on chronic respiratory failure with hypoxia (HCC) Patient is presenting with 1 week history of malaise, productive cough and increasing shortness of breath with 1 day history of fever.  She normally wears 2 L nasal cannula at home due to underlying ILD, currently requiring 6 L to maintain oxygen saturation above 90%.   --2/2 rhino/enteroviral infection, strep pneumo PNA, worsening pulm HTN --pulm consulted, deemed not a ILD exacerbation, so steroid not continued.  Hypotension In the setting of poor p.o. intake with evidence of AKI and hypovolemia on examination.   --BP improved with IVF  AKI (acute kidney injury) (HCC) In the setting of poor p.o. intake with hypovolemia on exam. --Cr 1.24 on presentation --hold further IVF   Demand ischemia Trop mildly elevated at 35 and 34.  Sjogren's disease [facility]) Patient previously established with Rheumatology, Dr. Helen Hashimoto.  Last follow-up was in 2021.  She was previously being treated with Plaquenil, but is not currently taking it at this time.  - Recommend outpatient follow-up with rheumatology  Hypothyroidism - cont home levothyroxine  Severe pulmonary hypertension (HCC) --current Echo showed Right ventricular systolic function is mildly reduced. The right  ventricular size is severely enlarged. There is severely elevated pulmonary artery systolic pressure. The estimated right  ventricular  systolic pressure is 65.8 mmHg.  --cardiology consulted, who deferred RHC to outpatient after infection is treated. --if RV fails can consider inotropes  --no need for diuresis, per cardio  Rhinovirus infection --supportive care  Streptococcus pneumoniae pneumonia (HCC) --received vanc/cefepime and azithromycin on presentation, de-escalated to ceftriaxone --cont ceftriaxone   DVT prophylaxis: Lovenox SQ Code Status: Full code  Family Communication: family updated at bedside today Level of care: Med-Surg Dispo:   The patient is from: home Anticipated d/c is to: home Anticipated d/c date is: tomorrow   Subjective and Interval History:  Pt reported feeling better today, was hoping to go home today.   Objective: Vitals:   12/04/21 2020 12/05/21 0455 12/05/21 0817 12/05/21 1425  BP: 115/63 (!) 140/73 119/62 (!) 115/56  Pulse: 79 82 88   Resp: 20 18 18 18   Temp: 98.5 F (36.9 C) 98.7 F (37.1 C) 99.4 F (37.4 C) 99 F (37.2 C)  TempSrc:  Oral Oral Oral  SpO2: 97% 92% 97% 98%  Weight:      Height:        Intake/Output Summary (Last 24 hours) at 12/05/2021 1910 Last data filed at 12/05/2021 0700 Gross per 24 hour  Intake 111.32 ml  Output 0 ml  Net 111.32 ml    Filed Weights   12/02/21 1246 12/02/21 1349  Weight: 59 kg 59 kg    Examination:   Constitutional: NAD, AAOx3, sitting in recliner HEENT: conjunctivae and lids normal, EOMI CV: No cyanosis.   RESP: diffuse crackles, on 2L Neuro: II - XII grossly intact.     Data Reviewed: I have personally reviewed labs and imaging studies  Time spent:  35 minutes  Darlin Priestly, MD [facility] If 7PM-7AM, please contact night-coverage 12/05/2021, 7:10 PM

## 2021-12-06 LAB — CBC
HCT: 38.4 % (ref 36.0–46.0)
Hemoglobin: 12 g/dL (ref 12.0–15.0)
MCH: 26.7 pg (ref 26.0–34.0)
MCHC: 31.3 g/dL (ref 30.0–36.0)
MCV: 85.3 fL (ref 80.0–100.0)
Platelets: 272 10*3/uL (ref 150–400)
RBC: 4.5 MIL/uL (ref 3.87–5.11)
RDW: 16.7 % — ABNORMAL HIGH (ref 11.5–15.5)
WBC: 9.2 10*3/uL (ref 4.0–10.5)
nRBC: 0 % (ref 0.0–0.2)

## 2021-12-06 LAB — BASIC METABOLIC PANEL
Anion gap: 8 (ref 5–15)
BUN: 15 mg/dL (ref 8–23)
CO2: 28 mmol/L (ref 22–32)
Calcium: 8.8 mg/dL — ABNORMAL LOW (ref 8.9–10.3)
Chloride: 103 mmol/L (ref 98–111)
Creatinine, Ser: 1.09 mg/dL — ABNORMAL HIGH (ref 0.44–1.00)
GFR, Estimated: 53 mL/min — ABNORMAL LOW (ref >60)
Glucose, Bld: 90 mg/dL (ref 70–99)
Potassium: 3.8 mmol/L (ref 3.5–5.1)
Sodium: 139 mmol/L (ref 135–145)

## 2021-12-06 LAB — MAGNESIUM: Magnesium: 2 mg/dL (ref 1.7–2.4)

## 2021-12-06 NOTE — Plan of Care (Signed)
Patient Denise [name], 2LPM [place], patient ambulates with standby assist, daughter present at bedside. Takes meds whole. No c/o pain. Continent B&B.  Problem: Education: Goal: Knowledge of General Education information will improve Description: Including pain rating scale, medication(s)/side effects and non-pharmacologic comfort measures Outcome: Progressing   Problem: Health Behavior/Discharge Planning: Goal: Ability to manage health-related needs will improve Outcome: Progressing   Problem: Clinical Measurements: Goal: Ability to maintain clinical measurements within normal limits will improve Outcome: Progressing Goal: Will remain free from infection Outcome: Progressing Goal: Diagnostic test results will improve Outcome: Progressing Goal: Respiratory complications will improve Outcome: Progressing Goal: Cardiovascular complication will be avoided Outcome: Progressing   Problem: Activity: Goal: Risk for activity intolerance will decrease Outcome: Progressing   Problem: Nutrition: Goal: Adequate nutrition will be maintained Outcome: Progressing   Problem: Coping: Goal: Level of anxiety will decrease Outcome: Progressing   Problem: Elimination: Goal: Will not experience complications related to bowel motility Outcome: Progressing Goal: Will not experience complications related to urinary retention Outcome: Progressing   Problem: Pain Managment: Goal: General experience of comfort will improve Outcome: Progressing   Problem: Safety: Goal: Ability to remain free from injury will improve Outcome: Progressing   Problem: Skin Integrity: Goal: Risk for impaired skin integrity will decrease Outcome: Progressing

## 2021-12-06 NOTE — Progress Notes (Signed)
PROGRESS NOTE    Ashlea Dusing  TDV:[id number] DOB: [birth year] DOA: 12/02/2021 PCP: Marya Fossa, PA-C  212A/212A-AA  LOS: 4 days   Brief hospital course:   Assessment & Plan: Alene Mires Darlin Drop is a 76 y.o. 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     * Acute on chronic respiratory failure with hypoxia (HCC) Patient is presenting with 1 week history of malaise, productive cough and increasing shortness of breath with 1 day history of fever.  She normally wears 2 L nasal cannula at home due to underlying ILD, requiring up to 6 L to maintain oxygen saturation above 90%.   --2/2 rhino/enteroviral infection, strep pneumo PNA, worsening pulm HTN --pulm consulted, deemed not a ILD exacerbation, so steroid not continued.  Hypotension In the setting of poor p.o. intake with evidence of AKI and hypovolemia on examination.   --BP improved with IVF  AKI (acute kidney injury) (HCC) In the setting of poor p.o. intake with hypovolemia on exam. --Cr 1.24 on presentation --hold further IVF   Demand ischemia Trop mildly elevated at 35 and 34.  Sjogren's disease [facility]) Patient previously established with Rheumatology, Dr. Helen Hashimoto.  Last follow-up was in 2021.  She was previously being treated with Plaquenil, but is not currently taking it at this time.  - Recommend outpatient follow-up with rheumatology  Hypothyroidism - cont home levothyroxine  Severe pulmonary hypertension (HCC) --current Echo showed Right ventricular systolic function is mildly reduced. The right  ventricular size is severely enlarged. There is severely elevated pulmonary artery systolic pressure. The estimated right  ventricular  systolic pressure is 65.8 mmHg.  --cardiology consulted, who deferred RHC to outpatient after infection is treated. --if RV fails can consider inotropes  --no need for diuresis, per cardio  Rhinovirus infection --supportive care  Streptococcus pneumoniae pneumonia (HCC) --received vanc/cefepime and azithromycin on presentation, de-escalated to ceftriaxone --cont ceftriaxone, today day 5   DVT prophylaxis: Lovenox SQ Code Status: Full code  Family Communication: family updated at bedside today Level of care: Med-Surg Dispo:   The patient is from: home Anticipated d/c is to: home Anticipated d/c date is: tomorrow   Subjective and Interval History:  Pt reported feeling better.     Objective: Vitals:   12/06/21 1014 12/06/21 1157 12/06/21 1636 12/06/21 1722  BP: 112/72   118/65  Pulse: 89   85  Resp: 18   20  Temp: 98.4 F (36.9 C)   100 F (37.8 C)  TempSrc: Oral   Oral  SpO2: 96% 96% 98% 97%  Weight:      Height:        Intake/Output Summary (Last 24 hours) at 12/06/2021 1729 Last data filed at 12/06/2021 0715 Gross per 24 hour  Intake 120 ml  Output 0 ml  Net 120 ml    Filed Weights   12/02/21 1246 12/02/21 1349  Weight: 59 kg 59 kg    Examination:   Constitutional: NAD, AAOx3 HEENT: conjunctivae and lids normal, EOMI CV: No cyanosis.   RESP: normal respiratory effort, loud crackles in all fields, worse at the bases, on 2L Neuro: II - XII grossly intact.   Psych: Normal mood and affect.  Appropriate judgement and reason   Data Reviewed: I have personally  reviewed labs and imaging studies  Time spent: 35 minutes  Darlin Priestly, MD [facility] If 7PM-7AM, please contact night-coverage 12/06/2021, 5:29 PM

## 2021-12-07 LAB — CBC
HCT: 39.6 % (ref 36.0–46.0)
Hemoglobin: 12.8 g/dL (ref 12.0–15.0)
MCH: 27.2 pg (ref 26.0–34.0)
MCHC: 32.3 g/dL (ref 30.0–36.0)
MCV: 84.3 fL (ref 80.0–100.0)
Platelets: 304 10*3/uL (ref 150–400)
RBC: 4.7 MIL/uL (ref 3.87–5.11)
RDW: 16.4 % — ABNORMAL HIGH (ref 11.5–15.5)
WBC: 10.8 10*3/uL — ABNORMAL HIGH (ref 4.0–10.5)
nRBC: 0 % (ref 0.0–0.2)

## 2021-12-07 LAB — BASIC METABOLIC PANEL
Anion gap: 8 (ref 5–15)
BUN: 17 mg/dL (ref 8–23)
CO2: 30 mmol/L (ref 22–32)
Calcium: 9.1 mg/dL (ref 8.9–10.3)
Chloride: 101 mmol/L (ref 98–111)
Creatinine, Ser: 1.06 mg/dL — ABNORMAL HIGH (ref 0.44–1.00)
GFR, Estimated: 55 mL/min — ABNORMAL LOW (ref >60)
Glucose, Bld: 95 mg/dL (ref 70–99)
Potassium: 4 mmol/L (ref 3.5–5.1)
Sodium: 139 mmol/L (ref 135–145)

## 2021-12-07 LAB — CULTURE, BLOOD (ROUTINE X 2)
Culture: NO GROWTH
Culture: NO GROWTH
Special Requests: ADEQUATE

## 2021-12-07 LAB — MAGNESIUM: Magnesium: 2 mg/dL (ref 1.7–2.4)

## 2021-12-07 NOTE — Plan of Care (Signed)

## 2021-12-07 NOTE — Progress Notes (Signed)
Patient being discharged to home with daughter. All belongings gathered. Discharge education given to daughter using [employer]pre[employer] screen. All questions answered. IV removed.

## 2021-12-07 NOTE — Discharge Summary (Signed)
Physician Discharge Summary   Denise [name]  female DOB: 05/01/1945  BJY:[id number]  PCP: Denise [name]  Admit date: 12/02/2021 Discharge date: 12/07/2021  Admitted From: home Disposition:  home Family updated at bedside prior to discharge. CODE STATUS: Full code   Hospital Course:  For full details, please see H&P, progress notes, consult notes and ancillary notes.  Briefly,  Denise [name] is a 76 y.o. female with medical history significant of chronic hypoxic respiratory failure on 2 L 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     * Acute on chronic respiratory failure with hypoxia (HCC) Patient is presenting with 1 week history of malaise, productive cough and increasing shortness of breath with 1 day history of fever.  She normally wears 2 L nasal cannula at home due to underlying ILD, requiring up to 6 L to maintain oxygen saturation above 90%.   --2/2 rhino/enteroviral infection, strep pneumo PNA, worsening pulm HTN --pulm consulted, deemed not a ILD exacerbation, so steroid not continued. --pt received treatment for strep pneumo PNA with improvement.  Pt was back on 2L home O2 prior to discharge.  Streptococcus pneumoniae pneumonia (HCC) --received vanc/cefepime and azithromycin on presentation, de-escalated to ceftriaxone --completed 5 days of IV abx with ceftriaxone.   Hypotension In the setting of poor p.o. intake with evidence of AKI and hypovolemia on examination.   --BP improved with IVF   AKI (acute kidney injury) (HCC) In the setting of poor p.o. intake with hypovolemia on exam. --Cr 1.24 on presentation, peaked at 1.38.  baseline around 1. --Cr improved with IVF, was 1.06 prior to discharge.   Demand ischemia Trop mildly elevated at 35  and 34.   Sjogren's disease Denise [name](HCC) Patient previously established with Rheumatology, Denise [name].  Last follow-up was in 2021.  She was previously being treated with Plaquenil, but is not currently taking it at this time. - outpatient follow-up with rheumatology   Hypothyroidism - cont home levothyroxine   Severe pulmonary hypertension (HCC) --current Echo showed Right ventricular systolic function is mildly reduced. The right  ventricular size is severely enlarged. There is severely elevated pulmonary artery systolic pressure. The estimated right ventricular  systolic pressure is 65.8 mmHg.  --cardiology consulted, who deferred RHC to outpatient after infection is treated. --no need for diuresis, per cardio   Rhinovirus infection --supportive care    Discharge Diagnoses:  Principal Problem:   Acute on chronic respiratory failure with hypoxia (HCC) Active Problems:   Hypotension   AKI (acute kidney injury) (HCC)   Demand ischemia   Sjogren's disease (HCC)   Hypothyroidism   Acute hypoxic respiratory failure (HCC)   Streptococcus pneumoniae pneumonia (HCC)   Rhinovirus infection   Severe pulmonary hypertension (HCC)   30 Day Unplanned Readmission Risk Score    Flowsheet Row ED to Hosp-Admission (Current) from 12/02/2021 [facility]  30 Day Unplanned Readmission Risk Score (%) 12.51 Filed at 12/07/2021 0401       This score is the patient's risk of an unplanned readmission within 30 days of being discharged (0 -100%). The score is based on dignosis, age, lab data, medications, orders, and past utilization.   Low:  0-14.9   Medium: 15-21.9   High: 22-29.9   Extreme: 30 and above  Discharge Instructions:  Allergies as of 12/07/2021       Reactions   Oxycodone Itching        Medication List     TAKE these medications    albuterol (2.5 MG/3ML) 0.083% nebulizer solution Commonly known as: PROVENTIL Take 3  mLs (2.5 mg total) by nebulization every 6 (six) hours as needed for wheezing or shortness of breath.   Ventolin HFA 108 (90 Base) MCG/ACT inhaler Generic drug: albuterol Inhale 1-2 puffs into the lungs every 6 (six) hours as needed for wheezing or shortness of breath.   atorvastatin 20 MG tablet Commonly known as: LIPITOR Take 20 mg by mouth daily.   Flutter Devi 1 each by Does not apply route 4 (four) times daily.   guaiFENesin-codeine 100-10 MG/5ML syrup Take 5 mLs by mouth every 4 (four) hours as needed for cough.   levothyroxine 100 MCG tablet Commonly known as: SYNTHROID Take 1 tablet (100 mcg total) by mouth daily at 6 (six) AM.   pilocarpine 5 MG tablet Commonly known as: SALAGEN Take 5 mg by mouth 3 (three) times daily.   prednisoLONE acetate 1 % ophthalmic suspension Commonly known as: PRED FORTE Place 1 drop into the left eye 2 (two) times daily.   senna-docusate 8.6-50 MG tablet Commonly known as: Senokot-S Take 1 tablet by mouth at bedtime.         Follow-up Information     Denise [name] Follow up on 12/12/2021.   Specialty: Cardiology Why: 11:20 AM Contact information: [address] [place] [address] [phone]         Denise [name] Follow up in 1 month(s).   Specialties: Pulmonary Disease, Cardiology Contact information: [address] [place] [address] [phone]                 Allergies  Allergen Reactions   Oxycodone Itching     The results of significant diagnostics from this hospitalization (including imaging, microbiology, ancillary and laboratory) are listed below for reference.   Consultations:   Procedures/Studies: ECHOCARDIOGRAM COMPLETE  Result Date: 12/03/2021    ECHOCARDIOGRAM REPORT   Patient Name:   Denise [name] Date of Exam: 12/02/2021 Medical Rec #:  [id number]                    Height:       61.0 in Accession #:    [id number]                    Weight:       130.0 lb Date of Birth:  [birth year]                     BSA:          1.573 m Patient Age:    76 years                     BP:           102/68 mmHg Patient Gender: F                            HR:           69 bpm. Exam Location:  [facility] Procedure: 2D Echo, Cardiac Doppler and Color Doppler Indications:     I27.2 Pulmonary hypertension  History:  Patient has no prior history of Echocardiogram examinations.  Sonographer:     Daphine Deutscher RDCS Referring Phys:  [id number] Verdene Lennert Diagnosing Phys: Debbe Odea [name] IMPRESSIONS  1. Left ventricular ejection fraction, by estimation, is 60 to 65%. The left ventricle has normal function. The left ventricle has no regional wall motion abnormalities. Left ventricular diastolic parameters are consistent with Grade I diastolic dysfunction (impaired relaxation). There is the interventricular septum is flattened in diastole ('D' shaped left ventricle), consistent with right ventricular volume overload.  2. Right ventricular systolic function is mildly reduced. The right ventricular size is severely enlarged. There is severely elevated pulmonary artery systolic pressure. The estimated right ventricular systolic pressure is 65.8 mmHg.  3. Left atrial size was severely dilated.  4. The mitral valve is normal in structure. Mild mitral valve regurgitation.  5. Tricuspid valve regurgitation is moderate.  6. The aortic valve is tricuspid. Aortic valve regurgitation is mild. Aortic valve sclerosis is present, with no evidence of aortic valve stenosis.  7. Aortic dilatation noted. There is mild dilatation of the ascending aorta, measuring 43 mm.  8. The inferior vena cava is normal in size with <50% respiratory variability, suggesting right atrial pressure of 8 mmHg. FINDINGS  Left Ventricle: Left ventricular ejection fraction, by estimation, is 60 to 65%. The left ventricle has normal function. The left ventricle has no regional wall motion  abnormalities. The left ventricular internal cavity size was normal in size. There is  no left ventricular hypertrophy. The interventricular septum is flattened in diastole ('D' shaped left ventricle), consistent with right ventricular volume overload. Left ventricular diastolic parameters are consistent with Grade I diastolic dysfunction  (impaired relaxation). Right Ventricle: The right ventricular size is severely enlarged. No increase in right ventricular wall thickness. Right ventricular systolic function is mildly reduced. There is severely elevated pulmonary artery systolic pressure. The tricuspid regurgitant velocity is 3.80 m/s, and with an assumed right atrial pressure of 8 mmHg, the estimated right ventricular systolic pressure is 65.8 mmHg. Left Atrium: Left atrial size was severely dilated. Right Atrium: Right atrial size was normal in size. Pericardium: There is no evidence of pericardial effusion. Mitral Valve: The mitral valve is normal in structure. Mild mitral valve regurgitation. Tricuspid Valve: The tricuspid valve is normal in structure. Tricuspid valve regurgitation is moderate. Aortic Valve: The aortic valve is tricuspid. Aortic valve regurgitation is mild. Aortic regurgitation PHT measures 369 msec. Aortic valve sclerosis is present, with no evidence of aortic valve stenosis. Pulmonic Valve: The pulmonic valve was normal in structure. Pulmonic valve regurgitation is mild to moderate. Aorta: Aortic dilatation noted. There is mild dilatation of the ascending aorta, measuring 43 mm. Venous: The inferior vena cava is normal in size with less than 50% respiratory variability, suggesting right atrial pressure of 8 mmHg. IAS/Shunts: No atrial level shunt detected by color flow Doppler.  LEFT VENTRICLE PLAX 2D LVIDd:         3.70 cm   Diastology LVIDs:         2.20 cm   LV e' medial:    6.85 cm/s LV PW:         0.70 cm   LV E/e' medial:  9.2 LV IVS:        0.70 cm   LV e' lateral:   10.00 cm/s LVOT  diam:     2.10 cm   LV E/e' lateral: 6.3 LV SV:         71 LV SV Index:  45 LVOT Area:     3.46 cm  RIGHT VENTRICLE            IVC RV Basal diam:  5.00 cm    IVC diam: 1.90 cm RV S prime:     9.17 cm/s TAPSE (M-mode): 1.4 cm LEFT ATRIUM             Index        RIGHT ATRIUM           Index LA diam:        4.10 cm 2.61 cm/m   RA Area:     25.20 cm LA Vol (A2C):   41.5 ml 26.39 ml/m  RA Volume:   93.70 ml  59.58 ml/m LA Vol (A4C):   35.7 ml 22.70 ml/m LA Biplane Vol: 38.7 ml 24.61 ml/m  AORTIC VALVE LVOT Vmax:   104.67 cm/s LVOT Vmean:  65.133 cm/s LVOT VTI:    0.204 m AI PHT:      369 msec  AORTA Ao Root diam: 3.40 cm Ao Asc diam:  4.30 cm MITRAL VALVE               TRICUSPID VALVE MV Area (PHT): 4.26 cm    TR Peak grad:   57.8 mmHg MV Decel Time: 178 msec    TR Vmax:        380.00 cm/s MV E velocity: 62.90 cm/s MV A velocity: 81.50 cm/s  SHUNTS MV E/A ratio:  0.77        Systemic VTI:  0.20 m                            Systemic Diam: 2.10 cm Debbe Odea [name] Electronically signed by Debbe Odea [name] Signature Date/Time: 12/03/2021/11:28:08 AM    Final    CT Angio Chest Pulmonary Embolism (PE) W or WO Contrast  Result Date: 12/02/2021 CLINICAL DATA:  Cough, fever, and shortness of breath. EXAM: CT ANGIOGRAPHY CHEST WITH CONTRAST TECHNIQUE: Multidetector CT imaging of the chest was performed using the standard protocol during bolus administration of intravenous contrast. Multiplanar CT image reconstructions and MIPs were obtained to evaluate the vascular anatomy. RADIATION DOSE REDUCTION: This exam was performed according to the departmental dose-optimization program which includes automated exposure control, adjustment of the mA and/or kV according to patient size and/or use of iterative reconstruction technique. CONTRAST:  75mL OMNIPAQUE IOHEXOL 350 MG/ML SOLN COMPARISON:  CT chest from same day. CTA chest dated August 31, 2020. FINDINGS: Cardiovascular: Satisfactory opacification of the  pulmonary arteries to the segmental level. No evidence of pulmonary embolism. Unchanged dilated main pulmonary artery measuring up to 3.7 cm in diameter. Unchanged cardiomegaly. No pericardial effusion. No thoracic aortic aneurysm or dissection. Coronary, aortic arch, and branch vessel atherosclerotic vascular disease. Mediastinum/Nodes: Unchanged mildly enlarged mediastinal and bilateral hilar lymph nodes. No enlarged axillary lymph nodes. The thyroid gland, trachea, and esophagus demonstrate no significant findings. Lungs/Pleura: Similar multifocal ground-glass densities, coarse septal thickening, subpleural reticulation, and traction bronchiectasis in both lungs, basilar predominant. Somewhat more confluent consolidation in both lower lobes, similar to today's chest CT, increased since August 2022. No pleural effusion or pneumothorax. Upper Abdomen: No acute abnormality. Musculoskeletal: No chest wall abnormality. No acute or significant osseous findings. Review of the MIP images confirms the above findings. IMPRESSION: 1. No evidence of pulmonary embolism. 2. Chronic pulmonary fibrosis with somewhat more confluent consolidation in both lower lobes, similar to today's chest CT, increased  since August 2022. This could reflect interstitial lung disease progression or superimposed pneumonia. 3. Unchanged mildly enlarged mediastinal and bilateral hilar lymph nodes, likely reactive. 4. Unchanged dilated main pulmonary artery, consistent with pulmonary arterial hypertension. 5.  Aortic atherosclerosis (ICD10-I70.0). Electronically Signed   By: Obie Dredge M.D.   On: 12/02/2021 18:54   CT CHEST WO CONTRAST  Result Date: 12/02/2021 CLINICAL DATA:  Respiratory illness, nondiagnostic xray EXAM: CT CHEST WITHOUT CONTRAST TECHNIQUE: Multidetector CT imaging of the chest was performed following the standard protocol without IV contrast. RADIATION DOSE REDUCTION: This exam was performed according to the departmental  dose-optimization program which includes automated exposure control, adjustment of the mA and/or kV according to patient size and/or use of iterative reconstruction technique. COMPARISON:  Radiograph earlier today. Chest CT 08/31/2020 FINDINGS: Cardiovascular: Moderate aortic atherosclerosis. Fusiform aneurysmal dilatation of the ascending aorta at 4.1 cm. Cardiomegaly. Dilated main pulmonary artery 3.7 cm. Prominent coronary artery calcifications. Mediastinum/Nodes: Lack of IV contrast limits detailed assessment. There is mild shotty multifocal mediastinal adenopathy with slight progression from prior exam. For example an anterior prevascular node measures 11 mm, series 2, image 43, previously 9 mm. Hilar adenopathy on prior exam is suboptimally assessed in the absence of IV contrast. The esophagus is decompressed. Lungs/Pleura: Chronic bronchiectasis, ill-defined multifocal ground-glass opacities and interstitial coarsening consistent with pulmonary fibrosis in a basilar predominant distribution mild progression and fibrotic changes from prior exam. Increased ground-glass in the posterior right lower lobe, for example series 3, image 80. This may represent progression of chronic disease or superimposed infection. No evidence of pulmonary edema. No pleural fluid. The trachea and central bronchi are patent. Upper Abdomen: No acute findings. Musculoskeletal: There are no acute or suspicious osseous abnormalities. IMPRESSION: 1. Chronic pulmonary fibrosis. Mild progression from prior exam. Increased ground-glass in the posterior right lower lobe may represent progression of chronic disease or superimposed infection. 2. Dilated main pulmonary artery suggesting pulmonary arterial hypertension. 3. Mild shotty multifocal mediastinal adenopathy with slight progression from prior exam, likely reactive. 4. Cardiomegaly.  Coronary artery calcifications 5. 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 Recommend annual imaging followup by CTA or MRA. This recommendation follows 2010 ACCF/AHA/AATS/ACR/ASA/SCA/SCAI/SIR/STS/SVM Guidelines for the Diagnosis and Management of Patients with Thoracic Aortic Disease. Circulation. 2010; 121: Z610-R604. Aortic aneurysm NOS (ICD10-I71.9) Aortic Atherosclerosis (ICD10-I70.0). Electronically Signed   By: Narda Rutherford M.D.   On: 12/02/2021 16:36   DG Chest Portable 1 View  Result Date: 12/02/2021 CLINICAL DATA:  Shortness of breath, cough, fever EXAM: PORTABLE CHEST 1 VIEW COMPARISON:  10/30/2020 FINDINGS: Cardiomegaly. Diffuse bilateral interstitial and heterogeneous airspace opacity, similar to prior examination. No focal airspace opacity. IMPRESSION: Cardiomegaly with diffuse bilateral interstitial and heterogeneous airspace opacity, similar to prior examination. This is in keeping with chronic underlying fibrosis, however there may be acutely superimposed edema or infection. CT may be helpful to further evaluate. Electronically Signed   By: Jearld Lesch M.D.   On: 12/02/2021 13:36      Labs: BNP (last 3 results) Recent Labs    12/04/21 0427  BNP 1,433.3*   Basic Metabolic Panel: Recent Labs  Lab 12/03/21 0636 12/04/21 0427 12/05/21 0442 12/06/21 0629 12/07/21 0435  NA 138 137 139 139 139  K 4.2 3.8 3.2* 3.8 4.0  CL 108 106 106 103 101  CO2 GLUCOSE 128* 98 95 90 95  BUN 22 26* CREATININE 1.14* 1.38* 1.10* 1.09*  1.06*  CALCIUM 8.8* 9.2 8.7* 8.8* 9.1  MG  --  2.0 2.0 2.0 2.0   Liver Function Tests: Recent Labs  Lab 12/02/21 1320 12/03/21 0636  AST 35 38  ALT 13 18  ALKPHOS 89 81  BILITOT 1.1 0.7  PROT 8.3* 7.6  ALBUMIN 3.2* 3.0*   No results for input(s): "LIPASE", "AMYLASE" in the last 168 hours. No results for input(s): "AMMONIA" in the last 168 hours. CBC: Recent Labs  Lab 12/03/21 0521 12/04/21 0427 12/05/21 0442 12/06/21 0629 12/07/21 0435  WBC 7.8 10.2 9.6 9.2 10.8*   NEUTROABS 6.3  --   --   --   --   HGB 11.1* 11.9* 11.8* 12.0 12.8  HCT 35.6* 37.9 37.7 38.4 39.6  MCV 85.8 85.0 85.1 85.3 84.3  PLT 216 240 244 272 304   Cardiac Enzymes: No results for input(s): "CKTOTAL", "CKMB", "CKMBINDEX", "TROPONINI" in the last 168 hours. BNP: Invalid input(s): "POCBNP" CBG: No results for input(s): "GLUCAP" in the last 168 hours. D-Dimer No results for input(s): "DDIMER" in the last 72 hours. Hgb A1c No results for input(s): "HGBA1C" in the last 72 hours. Lipid Profile No results for input(s): "CHOL", "HDL", "LDLCALC", "TRIG", "CHOLHDL", "LDLDIRECT" in the last 72 hours. Thyroid function studies No results for input(s): "TSH", "T4TOTAL", "T3FREE", "THYROIDAB" in the last 72 hours.  Invalid input(s): "FREET3" Anemia work up No results for input(s): "VITAMINB12", "FOLATE", "FERRITIN", "TIBC", "IRON", "RETICCTPCT" in the last 72 hours. Urinalysis    Component Value Date/Time   COLORURINE YELLOW (A) 11/07/2017 0704   APPEARANCEUR CLEAR (A) 11/07/2017 0704   LABSPEC >1.046 (H) 11/07/2017 0704   PHURINE 5.0 11/07/2017 0704   GLUCOSEU NEGATIVE 11/07/2017 0704   HGBUR NEGATIVE 11/07/2017 0704   BILIRUBINUR NEGATIVE 11/07/2017 0704   KETONESUR NEGATIVE 11/07/2017 0704   PROTEINUR NEGATIVE 11/07/2017 0704   NITRITE NEGATIVE 11/07/2017 0704   LEUKOCYTESUR SMALL (A) 11/07/2017 0704   Sepsis Labs Recent Labs  Lab 12/04/21 0427 12/05/21 0442 12/06/21 0629 12/07/21 0435  WBC 10.2 9.6 9.2 10.8*   Microbiology Recent Results (from the past 240 hour(s))  Resp panel by RT-PCR (RSV, Flu A&B, Covid) Anterior Nasal Swab     Status: None   Collection Time: 12/02/21 12:55 PM   Specimen: Anterior Nasal Swab  Result Value Ref Range Status   SARS Coronavirus 2 by RT PCR NEGATIVE NEGATIVE Final    Comment: (NOTE) SARS-CoV-2 target nucleic acids are NOT DETECTED.  The SARS-CoV-2 RNA is generally detectable in upper respiratory specimens during the acute phase  of infection. The lowest concentration of SARS-CoV-2 viral copies this assay can detect is 138 copies/mL. A negative result does not preclude SARS-Cov-2 infection and should not be used as the sole basis for treatment or other patient management decisions. A negative result may occur with  improper specimen collection/handling, submission of specimen other than nasopharyngeal swab, presence of viral mutation(s) within the areas targeted by this assay, and inadequate number of viral copies(<138 copies/mL). A negative result must be combined with clinical observations, patient history, and epidemiological information. The expected result is Negative.  Fact Sheet for Patients:  BloggerCourse.com  Fact Sheet for Healthcare Providers:  SeriousBroker.it  This test is no t yet approved or cleared by the [place] FDA and  has been authorized for detection and/or diagnosis of SARS-CoV-2 by FDA under an Emergency Use Authorization (EUA). This EUA will remain  in effect (meaning this test can be used) for the duration of the COVID-19  declaration under Section 564(b)(1) of the Act, 21 U.S.C.section 360bbb-3(b)(1), unless the authorization is terminated  or revoked sooner.       Influenza A by PCR NEGATIVE NEGATIVE Final   Influenza B by PCR NEGATIVE NEGATIVE Final    Comment: (NOTE) The Xpert Xpress SARS-CoV-2/FLU/RSV plus assay is intended as an aid in the diagnosis of influenza from Nasopharyngeal swab specimens and should not be used as a sole basis for treatment. Nasal washings and aspirates are unacceptable for Xpert Xpress SARS-CoV-2/FLU/RSV testing.  Fact Sheet for Patients: BloggerCourse.com  Fact Sheet for Healthcare Providers: SeriousBroker.it  This test is not yet approved or cleared by the [place] FDA and has been authorized for detection and/or diagnosis of  SARS-CoV-2 by FDA under an Emergency Use Authorization (EUA). This EUA will remain in effect (meaning this test can be used) for the duration of the COVID-19 declaration under Section 564(b)(1) of the Act, 21 U.S.C. section 360bbb-3(b)(1), unless the authorization is terminated or revoked.     Resp Syncytial Virus by PCR NEGATIVE NEGATIVE Final    Comment: (NOTE) Fact Sheet for Patients: BloggerCourse.com  Fact Sheet for Healthcare Providers: SeriousBroker.it  This test is not yet approved or cleared by the [place] FDA and has been authorized for detection and/or diagnosis of SARS-CoV-2 by FDA under an Emergency Use Authorization (EUA). This EUA will remain in effect (meaning this test can be used) for the duration of the COVID-19 declaration under Section 564(b)(1) of the Act, 21 U.S.C. section 360bbb-3(b)(1), unless the authorization is terminated or revoked.  Performed at [facility], [address] [place], [place] [address]   Respiratory (~20 pathogens) panel by PCR     Status: Abnormal   Collection Time: 12/02/21  1:01 PM   Specimen: Nasopharyngeal Swab; Respiratory  Result Value Ref Range Status   Adenovirus NOT DETECTED NOT DETECTED Final   Coronavirus 229E NOT DETECTED NOT DETECTED Final    Comment: (NOTE) The Coronavirus on the Respiratory Panel, DOES NOT test for the novel  Coronavirus (2019 nCoV)    Coronavirus HKU1 NOT DETECTED NOT DETECTED Final   Coronavirus NL63 NOT DETECTED NOT DETECTED Final   Coronavirus OC43 NOT DETECTED NOT DETECTED Final   Metapneumovirus NOT DETECTED NOT DETECTED Final   Rhinovirus / Enterovirus DETECTED (A) NOT DETECTED Final   Influenza A NOT DETECTED NOT DETECTED Final   Influenza B NOT DETECTED NOT DETECTED Final   Parainfluenza Virus 1 NOT DETECTED NOT DETECTED Final   Parainfluenza Virus 2 NOT DETECTED NOT DETECTED Final   Parainfluenza Virus 3 NOT DETECTED  NOT DETECTED Final   Parainfluenza Virus 4 NOT DETECTED NOT DETECTED Final   Respiratory Syncytial Virus NOT DETECTED NOT DETECTED Final   Bordetella pertussis NOT DETECTED NOT DETECTED Final   Bordetella Parapertussis NOT DETECTED NOT DETECTED Final   Chlamydophila pneumoniae NOT DETECTED NOT DETECTED Final   Mycoplasma pneumoniae NOT DETECTED NOT DETECTED Final    Comment: Performed at [facility], [address], [place], [place] [address]  Blood Culture (routine x 2)     Status: None   Collection Time: 12/02/21  1:20 PM   Specimen: BLOOD  Result Value Ref Range Status   Specimen Description BLOOD LEFT ARM  Final   Special Requests   Final    BOTTLES DRAWN AEROBIC AND ANAEROBIC Blood Culture adequate volume   Culture   Final    NO GROWTH 5 DAYS Performed at [facility], [address]  [place], [place] [address]    Report Status 12/07/2021 FINAL  Final  Blood Culture (routine x 2)     Status: None   Collection Time: 12/02/21  1:35 PM   Specimen: BLOOD  Result Value Ref Range Status   Specimen Description BLOOD BLOOD RIGHT HAND  Final   Special Requests   Final    BOTTLES DRAWN AEROBIC AND ANAEROBIC Blood Culture results may not be optimal due to an inadequate volume of blood received in culture bottles   Culture   Final    NO GROWTH 5 DAYS Performed at [facility], [address] [place], [place] [address]    Report Status 12/07/2021 FINAL  Final  Expectorated Sputum Assessment w Gram Stain, Rflx to Resp Cult     Status: None   Collection Time: 12/02/21  9:59 PM   Specimen: Expectorated Sputum  Result Value Ref Range Status   Specimen Description EXPECTORATED SPUTUM  Final   Special Requests NONE  Final   Sputum evaluation   Final    THIS SPECIMEN IS ACCEPTABLE FOR SPUTUM CULTURE Performed at [facility], [address] [place], [place] [address]    Report Status 12/03/2021 FINAL  Final  MRSA Next Gen by PCR, Nasal     Status:  None   Collection Time: 12/02/21  9:59 PM   Specimen: Nasal Mucosa; Nasal Swab  Result Value Ref Range Status   MRSA by PCR Next Gen NOT DETECTED NOT DETECTED Final    Comment: (NOTE) The GeneXpert MRSA Assay (FDA approved for NASAL specimens only), is one component of a comprehensive MRSA colonization surveillance program. It is not intended to diagnose MRSA infection nor to guide or monitor treatment for MRSA infections. Test performance is not FDA approved in patients less than 26 years old. Performed at [facility], [address] [place], [place] [address]   Culture, Respiratory w Gram Stain     Status: None   Collection Time: 12/02/21  9:59 PM  Result Value Ref Range Status   Specimen Description   Final    EXPECTORATED SPUTUM Performed at [facility], [address] [place], [place] [address]    Special Requests   Final    NONE Reflexed from [id number] Performed at [facility], [address] [place], [place] [address]    Gram Stain   Final    FEW SQUAMOUS EPITHELIAL CELLS PRESENT FEW WBC PRESENT, PREDOMINANTLY MONONUCLEAR FEW YEAST WITH PSEUDOHYPHAE Performed at [name], [address] [place], [place] [address]    Culture FEW CANDIDA ALBICANS FEW CANDIDA TROPICALIS   Final   Report Status 12/05/2021 FINAL  Final     Total time spend on discharging this patient, including the last patient exam, discussing the hospital stay, instructions for ongoing care as it relates to all pertinent caregivers, as well as preparing the medical discharge records, prescriptions, and/or referrals as applicable, is 30 minutes.    Darlin Priestly, [name]  [facility] 12/07/2021, 7:57 AM

## 2021-12-08 ENCOUNTER — Other Ambulatory Visit: Payer: Medicaid Other | Admitting: Student

## 2021-12-12 ENCOUNTER — Other Ambulatory Visit
Admission: RE | Admit: 2021-12-12 | Discharge: 2021-12-12 | Disposition: A | Discharge status: Home / Self Care | Payer: Medicaid Other | Source: Ambulatory Visit | Attending: Internal Medicine | Admitting: Internal Medicine

## 2021-12-12 ENCOUNTER — Encounter: Payer: Self-pay | Admitting: Internal Medicine

## 2021-12-12 ENCOUNTER — Ambulatory Visit: Payer: Medicaid Other | Attending: Internal Medicine | Admitting: Internal Medicine

## 2021-12-12 VITALS — BP 106/58 | HR 76 | Resp 16 | Wt 133.0 lb

## 2021-12-12 DIAGNOSIS — I5022 Chronic systolic (congestive) heart failure: Secondary | ICD-10-CM | POA: Insufficient documentation

## 2021-12-12 DIAGNOSIS — J849 Interstitial pulmonary disease, unspecified: Secondary | ICD-10-CM | POA: Diagnosis not present

## 2021-12-12 DIAGNOSIS — I272 Pulmonary hypertension, unspecified: Secondary | ICD-10-CM | POA: Diagnosis not present

## 2021-12-12 NOTE — Patient Instructions (Addendum)
Cambios de medicacin:  [place], Educational psychologist con los medicamentos actuales  Ruma de laboratorio:  Laboratorios realizados hoy, lo llamaremos si hay resultados anormales.  Pruebas/Procedimientos:  Cateterismo cardaco, consulte las instrucciones a continuacin  Referencias:  Lo han remitido a Sport and exercise psychologist, lo llamarn para programar una cita.  Instrucciones especiales // Educacin:  Instrucciones para el cateterismo cardaco [facility]  ? Tiene programado un cateterismo cardaco el:__jueves 7/12/23______________________ ? Llegue a las _8:30______am [employer] de su procedimiento. ? Espere una llamada de [employer] de servicio previo de [place] para preinscribirlo. ? No comas ni bebas nada despus de medianoche. ? Alguien tendr que llevarte a casa ? Se recomienda que alguien est con usted durante las primeras 24 horas despus de su procedimiento. ? Use ropa que sea fcil de poner y quitar y, si es posible, use zapatos sin cordones.   Medicamentos: traiga consigo una lista actualizada de todos los medicamentos.  _X__ Denise [name] tomar todos sus medicamentos la maana del procedimiento con suficiente agua para tragarlos de Wellsite geologist segura  ___ No tome estos medicamentos antes de su procedimiento:_______________________________________________________________________________________________________________________________________________________________________________________________________________________________  Da de su procedimiento: Llegue a la Midwife. Servicio de Geophysicist/field seismologist. Despus de ingresar al [employer], regstrese en el mostrador de Engineer, maintenance (IT) (Investment banker, corporate a su derecha) para recibir su brazalete. Despus de recibir su brazalete, alguien lo acompaar al rea de espera de cateterismo cardaco/procedimientos especiales.  La duracin habitual de la estancia despus del procedimiento es de aproximadamente 2 a 3 horas. Esto  puede variar.  Si tiene [employer], llame a Rolm Gala al [phone], o puede llamar directamente al laboratorio de cateterismo cardaco de [facility] al [phone].    Seguimiento en: 2 meses    Si tiene [place] pregunta o inquietud antes de su prxima cita, envenos un mensaje a travs de mychart o llame a Rolm Gala al [phone] de lunes a viernes de 8 am a 5 pm.  Si tiene una necesidad urgente fuera del horario de atencin durante el fin de [place], llame a su cardilogo primario o a la Clnica avanzada de insuficiencia cardaca en [place] al [phone].

## 2021-12-12 NOTE — H&P (View-Only) (Signed)
ADVANCED HF CLINIC CONSULT NOTE  Referring Physician:  Primary Care: Kerri Perches, PA-C Primary Cardiologist: None Pulmonology: Dr. Mortimer Fries   HPI:  Denise [name] is a 76 y.o. 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 Moderate TR  RVSP 62mmHG. Severe LAE. Asc aorta 4.3 cm   Has been followed by Dr. Mortimer Fries in Pulmonary for ILD. CT chest 11/23. No PE. Progressive ILD. Dilated PA  She is here with her daughter and we have a Administrator, sports assisting by phone. She is not aware of any diagnosis of CTD. Says she has had pulmonary fibrosis for years but they do not what caused. They think it might be related to previous smoking (quit 1995) and smoke from cooking.   Wears O2. Mild DOE. Mild LE edema. No CP, orthopnea or PND. No dizziness. No h/o DVT/PE.      Review of Systems: [y] = yes, [ ]  = no   General: Weight gain [ ] ; Weight loss [ ] ; Anorexia [ ] ; Fatigue Blue.Reese ]; Fever [ ] ; Chills [ ] ; Weakness [ ]   Cardiac: Chest pain/pressure [ ] ; Resting SOB [ ] ; Exertional SOB Blue.Reese ]; Orthopnea [ ] ; Pedal Edema [ ] ; Palpitations [ ] ; Syncope [ ] ; Presyncope [ ] ; Paroxysmal nocturnal dyspnea[ ] y  Pulmonary: Cough Blue.Reese ]; Wheezing[ y]; Hemoptysis[ ] ; Sputum [ ] ; Snoring [ ]   GI: Vomiting[ ] ; Dysphagia[ ] ; Melena[ ] ; Hematochezia [ ] ; Heartburn[ ] ; Abdominal pain [ ] ; Constipation [ ] ; Diarrhea [ ] ; BRBPR [ ]   GU: Hematuria[ ] ; Dysuria [ ] ; Nocturia[ ]   Vascular: Pain in legs with walking [ ] ; Pain in feet with lying flat [ ] ; Non-healing sores [ ] ; Stroke [ ] ; TIA [ ] ; Slurred  speech [ ] ;  Neuro: Headaches[ ] ; Vertigo[ ] ; Seizures[ ] ; Paresthesias[ ] ;Blurred vision [ ] ; Diplopia [ ] ; Vision changes [ ]   Ortho/Skin: Arthritis [ ] ; Joint pain [ ] ; Muscle pain [ ] ; Joint swelling [ ] ; Back Pain [ ] ; Rash [ ]   Psych: Depression[ ] ; Anxiety[ ]   Heme: Bleeding problems [ ] ; Clotting disorders [ ] ; Anemia [ ]   Endocrine: Diabetes [ ] ; Thyroid dysfunction[ ]    Past Medical History:  Diagnosis Date   Hypertension    Pulmonary fibrosis ([place])    Pulmonary hypertension ([place])    a. TTE 10/19:  EF of 65-70%, mild LVH, no RWMA, normal LV diastolic function, mildly dilated LA, RVSF cavity size normal with normal RVSF, mildly dilated RA, PASP 45 mmHg, dilated IVC consistent with elevated CVP, trivial pericardial effusion was noted   Sjogren's disease (HCC)    Thyroid disease     Current Outpatient Medications  Medication Sig Dispense Refill   albuterol (PROVENTIL) (2.5 MG/3ML) 0.083% nebulizer solution Take 3 mLs (2.5 mg total) by nebulization every 6 (six) hours as needed for wheezing or shortness of breath. 75 mL 12   atorvastatin (LIPITOR) 20 MG tablet Take 20 mg by mouth daily.     guaiFENesin-codeine 100-10 MG/5ML syrup Take 5 mLs by mouth every 4 (four) hours as needed for cough. 118 mL 0  levothyroxine (SYNTHROID, LEVOTHROID) 100 MCG tablet Take 1 tablet (100 mcg total) by mouth daily at 6 (six) AM. 30 tablet 0   pilocarpine (SALAGEN) 5 MG tablet Take 5 mg by mouth 3 (three) times daily.     prednisoLONE acetate (PRED FORTE) 1 % ophthalmic suspension Place 1 drop into the left eye 2 (two) times daily.     Respiratory Therapy Supplies (FLUTTER) DEVI 1 each by Does not apply route 4 (four) times daily. 1 each 0   senna-docusate (SENOKOT-S) 8.6-50 MG tablet Take 1 tablet by mouth at bedtime.     VENTOLIN HFA 108 (90 Base) MCG/ACT inhaler Inhale 1-2 puffs into the lungs every 6 (six) hours as needed for wheezing or shortness of breath.     No current facility-administered  medications for this visit.    Allergies  Allergen Reactions   Oxycodone Itching      Social History   Socioeconomic History   Marital status: Widowed    Spouse name: Not on file   Number of children: Not on file   Years of education: Not on file   Highest education level: Not on file  Occupational History   Not on file  Tobacco Use   Smoking status: Former   Smokeless tobacco: Never  Vaping Use   Vaping Use: Never used  Substance and Sexual Activity   Alcohol use: No   Drug use: No   Sexual activity: Never  Other Topics Concern   Not on file  Social History Narrative   Not on file   Social Determinants of Health   Financial Resource Strain: Not on file  Food Insecurity: No Food Insecurity (12/03/2021)   Hunger Vital Sign    Worried About Running Out of Food in the Last Year: Never true    Ran Out of Food in the Last Year: Never true  Transportation Needs: No Transportation Needs (12/03/2021)   PRAPARE - Administrator, Civil Service (Medical): No    Lack of Transportation (Non-Medical): No  Physical Activity: Not on file  Stress: Not on file  Social Connections: Not on file  Intimate Partner Violence: Not At Risk (12/03/2021)   Humiliation, Afraid, Rape, and Kick questionnaire    Fear of Current or Ex-Partner: No    Emotionally Abused: No    Physically Abused: No    Sexually Abused: No     No family history on file.  Vitals:   12/12/21 1100  BP: (!) 106/58  Pulse: 76  Resp: 16  SpO2: 98%  Weight: 133 lb (60.3 kg)    PHYSICAL EXAM: General:  Elderly frail wearing O2 HEENT: normal Neck: supple. no JVD. Carotids 2+ bilat; no bruits. No lymphadenopathy or thryomegaly appreciated. Cor: PMI nondisplaced. Regular rate & rhythm.oft TR. Increased P2 Lungs: decreased throughout  Abdomen: soft, nontender, nondistended. No hepatosplenomegaly. No bruits or masses. Good bowel sounds. Extremities: no cyanosis, rash, edema  + mild clubbing no skin  thickening.   Neuro: alert & oriented x 3, cranial nerves grossly intact. moves all 4 extremities w/o difficulty. Affect pleasant.  ECG: NSR 92 anterior TWI (c/w RV strain) Personally reviewed   ASSESSMENT & PLAN:  1. PAH - in setting of ILD and possible CTD (suspect CTD unlikely) - will need RHC and serologies - May be candidate for Tyvaso  2. ILD with chronic hypoxic respiratory failure - progressive on CT - consider referral to ILD Clinic will d/w Dr. Belia Heman - continue home O2  3. Ascending  aortic aneurysm - Asc aorta 4.3 cm on Echo 11/23 - Follow with serial echos  Total time spent 45 minutes. Over half that time spent discussing above.    Glori Bickers, MD  12:27 PM

## 2021-12-12 NOTE — Progress Notes (Unsigned)
ADVANCED HF CLINIC CONSULT NOTE  Referring Physician:  Primary Care: Kerri Perches, PA-C Primary Cardiologist: None Pulmonology: Dr. Mortimer Fries   HPI:  Denise [name] is a 76 y.o. 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 Moderate TR  RVSP 78mmHG. Severe LAE. Asc aorta 4.3 cm   Has been followed by Dr. Mortimer Fries in Pulmonary for ILD. CT chest 11/23. No PE. Progressive ILD. Dilated PA  She is here with her daughter and we have a Administrator, sports assisting by phone. She is not aware of any diagnosis of CTD. Says she has had pulmonary fibrosis for years but they do not what caused. They think it might be related to previous smoking (quit 1995) and smoke from cooking.   Wears O2. Mild DOE. Mild LE edema. No CP, orthopnea or PND. No dizziness. No h/o DVT/PE.      Review of Systems: [y] = yes, [ ]  = no   General: Weight gain [ ] ; Weight loss [ ] ; Anorexia [ ] ; Fatigue Blue.Reese ]; Fever [ ] ; Chills [ ] ; Weakness [ ]   Cardiac: Chest pain/pressure [ ] ; Resting SOB [ ] ; Exertional SOB Blue.Reese ]; Orthopnea [ ] ; Pedal Edema [ ] ; Palpitations [ ] ; Syncope [ ] ; Presyncope [ ] ; Paroxysmal nocturnal dyspnea[ ] y  Pulmonary: Cough Blue.Reese ]; Wheezing[ y]; Hemoptysis[ ] ; Sputum [ ] ; Snoring [ ]   GI: Vomiting[ ] ; Dysphagia[ ] ; Melena[ ] ; Hematochezia [ ] ; Heartburn[ ] ; Abdominal pain [ ] ; Constipation [ ] ; Diarrhea [ ] ; BRBPR [ ]   GU: Hematuria[ ] ; Dysuria [ ] ; Nocturia[ ]   Vascular: Pain in legs with walking [ ] ; Pain in feet with lying flat [ ] ; Non-healing sores [ ] ; Stroke [ ] ; TIA [ ] ; Slurred  speech [ ] ;  Neuro: Headaches[ ] ; Vertigo[ ] ; Seizures[ ] ; Paresthesias[ ] ;Blurred vision [ ] ; Diplopia [ ] ; Vision changes [ ]   Ortho/Skin: Arthritis [ ] ; Joint pain [ ] ; Muscle pain [ ] ; Joint swelling [ ] ; Back Pain [ ] ; Rash [ ]   Psych: Depression[ ] ; Anxiety[ ]   Heme: Bleeding problems [ ] ; Clotting disorders [ ] ; Anemia [ ]   Endocrine: Diabetes [ ] ; Thyroid dysfunction[ ]    Past Medical History:  Diagnosis Date   Hypertension    Pulmonary fibrosis ([place])    Pulmonary hypertension ([place])    a. TTE 10/19:  EF of 65-70%, mild LVH, no RWMA, normal LV diastolic function, mildly dilated LA, RVSF cavity size normal with normal RVSF, mildly dilated RA, PASP 45 mmHg, dilated IVC consistent with elevated CVP, trivial pericardial effusion was noted   Sjogren's disease (HCC)    Thyroid disease     Current Outpatient Medications  Medication Sig Dispense Refill   albuterol (PROVENTIL) (2.5 MG/3ML) 0.083% nebulizer solution Take 3 mLs (2.5 mg total) by nebulization every 6 (six) hours as needed for wheezing or shortness of breath. 75 mL 12   atorvastatin (LIPITOR) 20 MG tablet Take 20 mg by mouth daily.     guaiFENesin-codeine 100-10 MG/5ML syrup Take 5 mLs by mouth every 4 (four) hours as needed for cough. 118 mL 0  levothyroxine (SYNTHROID, LEVOTHROID) 100 MCG tablet Take 1 tablet (100 mcg total) by mouth daily at 6 (six) AM. 30 tablet 0   pilocarpine (SALAGEN) 5 MG tablet Take 5 mg by mouth 3 (three) times daily.     prednisoLONE acetate (PRED FORTE) 1 % ophthalmic suspension Place 1 drop into the left eye 2 (two) times daily.     Respiratory Therapy Supplies (FLUTTER) DEVI 1 each by Does not apply route 4 (four) times daily. 1 each 0   senna-docusate (SENOKOT-S) 8.6-50 MG tablet Take 1 tablet by mouth at bedtime.     VENTOLIN HFA 108 (90 Base) MCG/ACT inhaler Inhale 1-2 puffs into the lungs every 6 (six) hours as needed for wheezing or shortness of breath.     No current facility-administered  medications for this visit.    Allergies  Allergen Reactions   Oxycodone Itching      Social History   Socioeconomic History   Marital status: Widowed    Spouse name: Not on file   Number of children: Not on file   Years of education: Not on file   Highest education level: Not on file  Occupational History   Not on file  Tobacco Use   Smoking status: Former   Smokeless tobacco: Never  Vaping Use   Vaping Use: Never used  Substance and Sexual Activity   Alcohol use: No   Drug use: No   Sexual activity: Never  Other Topics Concern   Not on file  Social History Narrative   Not on file   Social Determinants of Health   Financial Resource Strain: Not on file  Food Insecurity: No Food Insecurity (12/03/2021)   Hunger Vital Sign    Worried About Running Out of Food in the Last Year: Never true    Ran Out of Food in the Last Year: Never true  Transportation Needs: No Transportation Needs (12/03/2021)   PRAPARE - Administrator, Civil Service (Medical): No    Lack of Transportation (Non-Medical): No  Physical Activity: Not on file  Stress: Not on file  Social Connections: Not on file  Intimate Partner Violence: Not At Risk (12/03/2021)   Humiliation, Afraid, Rape, and Kick questionnaire    Fear of Current or Ex-Partner: No    Emotionally Abused: No    Physically Abused: No    Sexually Abused: No     No family history on file.  Vitals:   12/12/21 1100  BP: (!) 106/58  Pulse: 76  Resp: 16  SpO2: 98%  Weight: 133 lb (60.3 kg)    PHYSICAL EXAM: General:  Elderly frail wearing O2 HEENT: normal Neck: supple. no JVD. Carotids 2+ bilat; no bruits. No lymphadenopathy or thryomegaly appreciated. Cor: PMI nondisplaced. Regular rate & rhythm.oft TR. Increased P2 Lungs: decreased throughout  Abdomen: soft, nontender, nondistended. No hepatosplenomegaly. No bruits or masses. Good bowel sounds. Extremities: no cyanosis, rash, edema  + mild clubbing no skin  thickening.   Neuro: alert & oriented x 3, cranial nerves grossly intact. moves all 4 extremities w/o difficulty. Affect pleasant.  ECG: NSR 92 anterior TWI (c/w RV strain) Personally reviewed   ASSESSMENT & PLAN:  1. PAH - in setting of ILD and possible CTD (suspect CTD unlikely) - will need RHC and serologies - May be candidate for Tyvaso  2. ILD with chronic hypoxic respiratory failure - progressive on CT - consider referral to ILD Clinic will d/w Dr. Belia Heman - continue home O2  3. Ascending  aortic aneurysm - Asc aorta 4.3 cm on Echo 11/23 - Follow with serial echos  Total time spent 45 minutes. Over half that time spent discussing above.    Glori Bickers, MD  12:27 PM

## 2021-12-13 LAB — CYCLIC CITRUL PEPTIDE ANTIBODY, IGG/IGA: CCP Antibodies IgG/IgA: <1 units (ref 0–19)

## 2021-12-13 LAB — ANTI-SCLERODERMA ANTIBODY: Scleroderma (Scl-70) (ENA) Antibody, IgG: <0.2 AI (ref 0.0–0.9)

## 2021-12-13 LAB — ANA: Anti Nuclear Antibody (ANA): POSITIVE — AB

## 2021-12-14 LAB — RHEUMATOID FACTOR: Rheumatoid fact SerPl-aCnc: 123.3 IU/mL — ABNORMAL HIGH (ref <14.0)

## 2021-12-15 LAB — ANCA TITERS
Atypical P-ANCA titer: <1:20 {titer}
C-ANCA: <1:20 {titer}
P-ANCA: <1:20 {titer}

## 2021-12-18 ENCOUNTER — Ambulatory Visit
Admission: RE | Admit: 2021-12-18 | Discharge: 2021-12-18 | Disposition: A | Discharge status: Home / Self Care | Payer: Medicaid Other | Attending: Internal Medicine | Admitting: Internal Medicine

## 2021-12-18 ENCOUNTER — Encounter: Payer: Self-pay | Admitting: Internal Medicine

## 2021-12-18 ENCOUNTER — Other Ambulatory Visit: Payer: Self-pay

## 2021-12-18 ENCOUNTER — Encounter
Admission: RE | Disposition: A | Discharge status: Home / Self Care | Payer: Self-pay | Source: Home / Self Care | Attending: Internal Medicine

## 2021-12-18 ENCOUNTER — Ambulatory Visit: Payer: Medicaid Other

## 2021-12-18 DIAGNOSIS — I272 Pulmonary hypertension, unspecified: Secondary | ICD-10-CM | POA: Diagnosis present

## 2021-12-18 DIAGNOSIS — Z9981 Dependence on supplemental oxygen: Secondary | ICD-10-CM | POA: Diagnosis not present

## 2021-12-18 DIAGNOSIS — J8410 Pulmonary fibrosis, unspecified: Secondary | ICD-10-CM | POA: Diagnosis not present

## 2021-12-18 DIAGNOSIS — J9611 Chronic respiratory failure with hypoxia: Secondary | ICD-10-CM | POA: Insufficient documentation

## 2021-12-18 DIAGNOSIS — I7121 Aneurysm of the ascending aorta, without rupture: Secondary | ICD-10-CM | POA: Insufficient documentation

## 2021-12-18 DIAGNOSIS — I10 Essential (primary) hypertension: Secondary | ICD-10-CM | POA: Insufficient documentation

## 2021-12-18 DIAGNOSIS — Z1152 Encounter for screening for COVID-19: Secondary | ICD-10-CM | POA: Diagnosis not present

## 2021-12-18 HISTORY — PX: RIGHT HEART CATH: CATH118263

## 2021-12-18 LAB — POCT I-STAT EG7
Acid-Base Excess: 4 mmol/L — ABNORMAL HIGH (ref 0.0–2.0)
Acid-Base Excess: 6 mmol/L — ABNORMAL HIGH (ref 0.0–2.0)
Bicarbonate: 29.7 mmol/L — ABNORMAL HIGH (ref 20.0–28.0)
Bicarbonate: 31.4 mmol/L — ABNORMAL HIGH (ref 20.0–28.0)
Calcium, Ion: 1.21 mmol/L (ref 1.15–1.40)
Calcium, Ion: 1.23 mmol/L (ref 1.15–1.40)
HCT: 41 % (ref 36.0–46.0)
HCT: 41 % (ref 36.0–46.0)
Hemoglobin: 13.9 g/dL (ref 12.0–15.0)
Hemoglobin: 13.9 g/dL (ref 12.0–15.0)
O2 Saturation: 59 %
O2 Saturation: 61 %
Potassium: 3.4 mmol/L — ABNORMAL LOW (ref 3.5–5.1)
Potassium: 3.5 mmol/L (ref 3.5–5.1)
Sodium: 139 mmol/L (ref 135–145)
Sodium: 139 mmol/L (ref 135–145)
TCO2: 31 mmol/L (ref 22–32)
TCO2: 33 mmol/L — ABNORMAL HIGH (ref 22–32)
pCO2, Ven: 45.6 mmHg (ref 44–60)
pCO2, Ven: 47.5 mmHg (ref 44–60)
pH, Ven: 7.422 (ref 7.25–7.43)
pH, Ven: 7.429 (ref 7.25–7.43)
pO2, Ven: 30 mmHg — CL (ref 32–45)
pO2, Ven: 31 mmHg — CL (ref 32–45)

## 2021-12-18 LAB — RESP PANEL BY RT-PCR (FLU A&B, COVID) ARPGX2
Influenza A by PCR: NEGATIVE
Influenza B by PCR: NEGATIVE
SARS Coronavirus 2 by RT PCR: NEGATIVE

## 2021-12-18 SURGERY — RIGHT HEART CATH
Anesthesia: Moderate Sedation | Laterality: Right

## 2021-12-18 MED ORDER — FUROSEMIDE 20 MG PO TABS: 20.0000 mg | ORAL_TABLET | ORAL | 11 refills | Status: AC

## 2021-12-18 MED ORDER — SODIUM CHLORIDE 0.9 % IV SOLN: INTRAVENOUS | Status: DC

## 2021-12-18 MED ORDER — SODIUM CHLORIDE 0.9% FLUSH: 3.0000 mL | INTRAVENOUS | Status: DC | PRN

## 2021-12-18 MED ORDER — LIDOCAINE HCL 1 % IJ SOLN
INTRAMUSCULAR | Status: AC
  Filled 2021-12-18: qty 20

## 2021-12-18 MED ORDER — FUROSEMIDE 10 MG/ML IJ SOLN: 40.0000 mg | INTRAMUSCULAR | Status: AC

## 2021-12-18 MED ORDER — SODIUM CHLORIDE 0.9 % IV SOLN: 250.0000 mL | INTRAVENOUS | Status: DC | PRN

## 2021-12-18 MED ORDER — HEPARIN (PORCINE) IN NACL 1000-0.9 UT/500ML-% IV SOLN
INTRAVENOUS | Status: AC
  Filled 2021-12-18: qty 1000

## 2021-12-18 MED ORDER — FUROSEMIDE 10 MG/ML IJ SOLN
INTRAMUSCULAR | Status: AC
  Administered 2021-12-18: 40 mg via INTRAVENOUS
  Filled 2021-12-18: qty 4

## 2021-12-18 MED ORDER — FENTANYL CITRATE (PF) 100 MCG/2ML IJ SOLN
INTRAMUSCULAR | Status: AC
  Filled 2021-12-18: qty 2

## 2021-12-18 MED ORDER — LIDOCAINE HCL (PF) 1 % IJ SOLN
INTRAMUSCULAR | Status: DC | PRN
  Administered 2021-12-18: 2 mL

## 2021-12-18 MED ORDER — SODIUM CHLORIDE 0.9% FLUSH: 3.0000 mL | Freq: Two times a day (BID) | INTRAVENOUS | Status: DC

## 2021-12-18 MED ORDER — FUROSEMIDE 10 MG/ML IJ SOLN: 40.0000 mg | INTRAMUSCULAR | Status: DC

## 2021-12-18 MED ORDER — HEPARIN (PORCINE) IN NACL 1000-0.9 UT/500ML-% IV SOLN
INTRAVENOUS | Status: DC | PRN
  Administered 2021-12-18 (×2): 500 mL

## 2021-12-18 MED ORDER — MIDAZOLAM HCL 2 MG/2ML IJ SOLN
INTRAMUSCULAR | Status: AC
  Filled 2021-12-18: qty 2

## 2021-12-18 SURGICAL SUPPLY — 8 items
CATH SWAN GANZ 7F STRAIGHT (CATHETERS) IMPLANT
DRAPE BRACHIAL (DRAPES) IMPLANT
GLIDESHEATH SLENDER 7FR .021G (SHEATH) IMPLANT
GUIDEWIRE .025 260CM (WIRE) IMPLANT
PACK CARDIAC CATH (CUSTOM PROCEDURE TRAY) IMPLANT
PROTECTION STATION PRESSURIZED (MISCELLANEOUS) ×1
SET ATX SIMPLICITY (MISCELLANEOUS) IMPLANT
STATION PROTECTION PRESSURIZED (MISCELLANEOUS) IMPLANT

## 2021-12-18 NOTE — Progress Notes (Signed)
Portable Chest x-ray done now. Pt. Voiding amber clear urine post Lasix 40 mg slow IVP.

## 2021-12-18 NOTE — Progress Notes (Signed)
   Patient with several days of increasing SOB and cough with productive sputum.   Diffuse wheezing and crackles on exam.   Will get CXR and give lasix 40 IV. Check flu and covid swabs.   Will re-evaluate for next steps over the next hour or two.   Arvilla Meres, MD  10:05 AM

## 2021-12-18 NOTE — Progress Notes (Signed)
Dr. Gala Romney at [facility]ie, speaking with pt. Daughter re: RHC results. MD also spoke with another daughter on phone for need to see Pum. Fibrosis specialist in [place]. Pt. In no acute distress.

## 2021-12-18 NOTE — Progress Notes (Addendum)
MD messaged earlier. MD arrived now & assessed pt.  For severely congested lungs ant. & post./wheezes throughout; + rub. STAT orders received now RA O2 sat (off O2): 86%; up to 95% when O2 placed back on.

## 2021-12-18 NOTE — Interval H&P Note (Signed)
History and Physical Interval Note:  12/18/2021 12:28 PM  Denise [name]  has presented today for surgery, with the diagnosis of R Cath   Pulmonary hypertension Spanish Interperter Requested.  The various methods of treatment have been discussed with the patient and family. After consideration of risks, benefits and other options for treatment, the patient has consented to  Procedure(s): RIGHT HEART CATH (Right) as a surgical intervention.  The patient's history has been reviewed, patient examined, no change in status, stable for surgery.  I have reviewed the patient's chart and labs.  Questions were answered to the patient's satisfaction.     Donatella Walski

## 2021-12-19 ENCOUNTER — Encounter: Payer: Self-pay | Admitting: Internal Medicine

## 2022-01-27 ENCOUNTER — Ambulatory Visit (INDEPENDENT_AMBULATORY_CARE_PROVIDER_SITE_OTHER): Payer: Medicaid Other | Admitting: Internal Medicine

## 2022-01-27 ENCOUNTER — Encounter: Payer: Self-pay | Admitting: Internal Medicine

## 2022-01-27 VITALS — BP 122/74 | HR 82 | Temp 97.5°F | Ht 61.0 in | Wt 131.0 lb

## 2022-01-27 DIAGNOSIS — R918 Other nonspecific abnormal finding of lung field: Secondary | ICD-10-CM

## 2022-01-27 DIAGNOSIS — J84112 Idiopathic pulmonary fibrosis: Secondary | ICD-10-CM

## 2022-01-27 NOTE — Patient Instructions (Signed)
Please avoid secondhand smoke exposure Continue Oxygen as prescribed For Wheezing and COUGH, can use ALBUTEROL NEBS every 6 hrs as needed

## 2022-01-27 NOTE — Progress Notes (Signed)
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 (dating back to 2014) Medical history significant for mild pulmonary hypertension Patient is from [place], speaks Spanish, migraine to the [place] in 2013   TEST/EVENTS :  CT chest August 31, 2020 negative for PE, stable pulmonary fibrosis stable borderline enlarged adenopathy  TTE 10/19:  EF of 65-70%, mild LVH, no RWMA, normal LV diastolic function, mildly dilated LA, RVSF cavity size normal with normal RVSF, mildly dilated RA, PASP 45 mmHg, dilated IVC consistent with elevated CVP, trivial pericardial effusion was noted  CT chest October 2019 shows diffuse interstitial coarsening consistent with known pulmonary fibrosis.  Overall slight progression of disease since prior CT chest.  2018.  Bilateral hilar and mediastinal adenopathy.   09/17/2020 Follow up : ILD , post hospital follow-up (visit with Interpretor)  Patient returns for a follow-up visit.  She has underlying interstitial lung disease associated with connective tissue disorder.  Patient has Sjogren's with scleroderma features.  She has managed by rheumatology and is on Plaquenil daily. Patient was recently admitted last month for acute ILD flare and bronchitis.  She was treated with IV antibiotics, nebulized bronchodilators and steroids.  She did require oxygen at discharge at 2 L.  She was discharged on antibiotics and steroids.  But she has finished.  Since discharge patient says she is feeling better but remains short of breath. She can not use her portable tanks as she could not figure out how they work.  O2 sats 84% on room air on arrival. Today in office walk test required 2l/m O2 to keep sats >88-90%. POC device was used with O2 sats 93% on 2l/m . She wants  a POC , can not lift or handle heavy tanks . She is by herself a lot.  Lives with daughter , light house chores. Gets short of breath easily .  Spends 6 months in [place] and 6 months in [place]. Plans to go in Dec.    10/2017 Troponin negative x 2. EKG without acute significant abnormalities. Echo showed an EF of 65-70%, mild LVH, no RWMA, normal LV diastolic function, mildly dilated LA, RVSF cavity size normal with normal RVSF, mildly dilated RA, PASP 45 mmHg, dilated IVC consistent with elevated CVP, trivial pericardial effusion was noted.    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 Intermittent use of prednisone antibiotics makes her feel better   Has dx of Sjogrens will need rheumatology referral  No exacerbation at this time No evidence of heart failure at this time No evidence or signs of infection at this time No respiratory distress No fevers, chills, nausea, vomiting, diarrhea No evidence of lower extremity edema No evidence hemoptysis   PFTs October 2022 Reviewed in detail with the patient Mild restrictive lung disease with FEV1 of 84% predicted FVC is 66% predicted  Patient had a previous history of significant tobacco abuse 1 pack a day for 40 years along with 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  has a past surgical history  that includes RIGHT HEART CATH (Right, 12/18/2021). Prior to Admission medications   Medication Sig Start Date End Date Taking? Authorizing Provider  hydroxychloroquine (PLAQUENIL) 200 MG tablet Take 1 tablet (200 mg total) by mouth daily. 11/09/17   Epifanio Lesches, MD  levothyroxine (SYNTHROID, LEVOTHROID) 100 MCG tablet Take 1 tablet (100 mcg total) by mouth daily at 6 (six) AM. 11/09/17   Epifanio Lesches, MD  naproxen (NAPROSYN) 500 MG tablet Take 1 tablet (500 mg total) by mouth 2 (two) times daily with a meal. 11/08/17   Epifanio Lesches, MD  pilocarpine (SALAGEN) 5 MG tablet Take 5 mg by mouth 3 (three) times daily.    [provider]   Allergies  Allergen Reactions   Oxycodone Itching    SOCIAL HISTORY:  reports that she has quit smoking. She has never used smokeless tobacco. She reports that she does not drink alcohol and does not use drugs.  BP 122/74 (BP Location: Left Arm, Cuff Size: Normal)   Pulse 82   Temp (!) 97.5 F (36.4 C) (Temporal)   Ht 5\' 1"  (1.549 m)   Wt 131 lb (59.4 kg)   SpO2 91%   BMI 24.75 kg/m     Review of Systems: Gen:  Denies  fever, sweats, chills weight loss  HEENT: Denies blurred vision, double vision, ear pain, eye pain, hearing loss, nose bleeds, sore throat Cardiac:  No dizziness, chest pain or heaviness, chest tightness,edema, No JVD Resp:   No cough, -sputum production, -shortness of breath,-wheezing, -hemoptysis,  Other:  All other systems negative    Physical Examination:   General Appearance: No distress  EYES PERRLA, EOM intact.   NECK Supple, No JVD Pulmonary: normal breath sounds, +crackles CardiovascularNormal S1,S2.  No m/r/g.   Abdomen: Benign, Soft, non-tender. ALL OTHER ROS ARE NEGATIVE      ASSESSMENT / PLAN:  77 year old Hispanic female seen today for follow-up for abnormal CT scan with several abnormalities which include bilateral interstitial infiltrates dating back to 2018 likely related  to fibrotic NSIP UIP with pulmonary fibrosis with exacerbations in the past   Reviewing with CT scans again with the patient now has progressive hypoxic respiratory failure from several infections and admissions leading to respiratory insufficiency with chronic hypoxic respiratory failure  Previous office visit patient was referred to rheumatology for connective tissue disease and Sjogren's diagnosis  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 All questions answered  Follow up 1 year  Total time spent 25 minutes  Maretta Bees Patricia Pesa, M.D.  [facility]  Medical Director [place] Director [facility] Cardio-Pulmonary Department

## 2022-02-08 NOTE — Progress Notes (Signed)
ADVANCED HF CLINIC CONSULT NOTE  Referring Physician:  Primary Care: Kerri Perches, PA-C Primary Cardiologist: None Pulmonology: Dr. Mortimer Fries   HPI:  Denise [name] is a 77 y.o. 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 Moderate TR  RVSP 61mmHG. Severe LAE. Asc aorta 4.3 cm   Has been followed by Dr. Mortimer Fries in Pulmonary for ILD. CT chest 11/23. No PE. Progressive ILD. Dilated PA No h/o CTD or PE. Quit smoking 1995  Underwent RHC 12/18/21, On presentation was wheezing and coughing, Got IV lasix and nebs prior to procedure. RHC with low filling pressure and mild PAH. Low cardiac output. Referred to ILD clinic for possible Tyvaso   RA = 1 RV = 50/2 PA = 45/22 (32) PCW = 10 Fick cardiac output/index = 2.9/1.8 PVR = 7.7 WU FA sat = 99% PA sat = 59%, 61% PAPi  = 23  Here with her daughter. Remains very weak. Feels she is getting worse. SOB with any activity. + chronic cough. Has CP with cough. Very fatigued. No edema. No syncope. Wearing O2. Saw Dr. Mortimer Fries recently and told that she has end-stage lung disease.     Past Medical History:  Diagnosis Date   Hypertension    Pulmonary fibrosis ([place])    Pulmonary hypertension ([place])    a. TTE 10/19:  EF of 65-70%, mild LVH, no RWMA, normal LV diastolic function, mildly dilated LA, RVSF cavity size normal with normal RVSF, mildly dilated RA, PASP 45 mmHg, dilated IVC consistent with elevated CVP, trivial pericardial effusion was noted   Sjogren's disease ([facility])    Thyroid disease     Current Outpatient Medications   Medication Sig Dispense Refill   albuterol (PROVENTIL) (2.5 MG/3ML) 0.083% nebulizer solution Take 3 mLs (2.5 mg total) by nebulization every 6 (six) hours as needed for wheezing or shortness of breath. 75 mL 12   atorvastatin (LIPITOR) 20 MG tablet Take 20 mg by mouth daily.     furosemide (LASIX) 20 MG tablet Take 1 tablet (20 mg total) by mouth every Monday, Wednesday, and Friday. 12 tablet 11   guaiFENesin-codeine 100-10 MG/5ML syrup Take 5 mLs by mouth every 4 (four) hours as needed for cough. 118 mL 0   levothyroxine (SYNTHROID, LEVOTHROID) 100 MCG tablet Take 1 tablet (100 mcg total) by mouth daily at 6 (six) AM. 30 tablet 0   pilocarpine (SALAGEN) 5 MG tablet Take 5 mg by mouth 3 (three) times daily.     prednisoLONE acetate (PRED FORTE) 1 % ophthalmic suspension Place 1 drop into the left eye 2 (two) times daily.     Respiratory Therapy Supplies (FLUTTER) DEVI 1 each by Does not apply route 4 (four) times daily. 1 each 0   senna-docusate (SENOKOT-S) 8.6-50 MG tablet Take 1 tablet by mouth at bedtime.     VENTOLIN HFA 108 (90 Base) MCG/ACT inhaler Inhale 1-2 puffs into the lungs every 6 (six) hours as needed for wheezing or shortness of breath.     No current facility-administered medications for this visit.    Allergies  Allergen Reactions   Oxycodone  Itching      Social History   Socioeconomic History   Marital status: Widowed    Spouse name: Not on file   Number of children: 12   Years of education: Not on file   Highest education level: Not on file  Occupational History   Not on file  Tobacco Use   Smoking status: Former    Types: Cigarettes   Smokeless tobacco: Never  Vaping Use   Vaping Use: Never used  Substance and Sexual Activity   Alcohol use: No   Drug use: No   Sexual activity: Never  Other Topics Concern   Not on file  Social History Narrative   Lives with daughters (4).    Social Determinants of Health   Financial Resource Strain: Not on file   Food Insecurity: No Food Insecurity (12/03/2021)   Hunger Vital Sign    Worried About Running Out of Food in the Last Year: Never true    Ran Out of Food in the Last Year: Never true  Transportation Needs: No Transportation Needs (12/03/2021)   PRAPARE - Hydrologist (Medical): No    Lack of Transportation (Non-Medical): No  Physical Activity: Not on file  Stress: Not on file  Social Connections: Not on file  Intimate Partner Violence: Not At Risk (12/03/2021)   Humiliation, Afraid, Rape, and Kick questionnaire    Fear of Current or Ex-Partner: No    Emotionally Abused: No    Physically Abused: No    Sexually Abused: No     No family history on file.  Vitals:   02/09/22 1032  Weight: 131 lb 3.2 oz (59.5 kg)     PHYSICAL EXAM: General:  Elderly frail wearing O2 HEENT: normal Neck: supple. no JVD. Carotids 2+ bilat; no bruits. No lymphadenopathy or thryomegaly appreciated. Cor: PMI nondisplaced. Regular rate & rhythm. No rubs, gallops or murmurs. Lungs: diffuse crackles  Abdomen: soft, nontender, nondistended. No hepatosplenomegaly. No bruits or masses. Good bowel sounds. Extremities: no cyanosis,rash, edema, clubbing Neuro: alert & orientedx3, cranial nerves grossly intact. moves all 4 extremities w/o difficulty. Affect pleasant   ASSESSMENT & PLAN:  1. 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 ? Component of CTD from Sjogren's (has referral to Rheum) - Referred to ILD clinic  May be candidate for Tyvaso. Not canddiate for oral agents with severe lung disease  2. ILD with chronic hypoxic respiratory failure - progressive on CT - D/w Dr. Mortimer Fries. -> referred to ILD Clinic  - continue home O2  3. Ascending aortic aneurysm - Asc aorta 4.3 cm on Echo 11/23 - Follow with serial echos  4. Chest pain, pleuritic - doubt cardiac  Glori Bickers, MD  10:53 AM

## 2022-02-09 ENCOUNTER — Encounter: Payer: Self-pay | Admitting: Internal Medicine

## 2022-02-09 ENCOUNTER — Ambulatory Visit: Payer: Medicaid Other | Attending: Internal Medicine | Admitting: Internal Medicine

## 2022-02-09 VITALS — BP 98/60 | HR 82 | Wt 131.2 lb

## 2022-02-09 DIAGNOSIS — R0781 Pleurodynia: Secondary | ICD-10-CM | POA: Diagnosis not present

## 2022-02-09 DIAGNOSIS — J849 Interstitial pulmonary disease, unspecified: Secondary | ICD-10-CM | POA: Diagnosis not present

## 2022-02-09 DIAGNOSIS — I272 Pulmonary hypertension, unspecified: Secondary | ICD-10-CM | POA: Diagnosis not present

## 2022-02-09 NOTE — Patient Instructions (Signed)
Medication Changes:  No cambios en tus medicamentos.   Lab Work:  No examenes de laboratorio.  Testing/Procedures:  No procedimientos.  Referrals:  Se le ha echo una derivacion a el especialista Dr. Chase Caller . La clinica le llamara para hacer la cita.    Follow-Up in: su cita de seguimiento es en 4 meses. La clinica le llamara para hacer su cita semanas antes.    If you have any questions or concerns before your next appointment please send [place] a message through [place] or call our office at [phone] Monday-Friday 8 am-5 pm.   If you have an urgent need after hours on the weekend please call your Primary Cardiologist or the [facility] in [place] at [phone].

## 2022-02-12 ENCOUNTER — Telehealth: Payer: Self-pay | Admitting: Internal Medicine

## 2022-02-12 NOTE — Telephone Encounter (Signed)
Front desk  Patient has ILD. Referred by Dr Jeffie Pollock. HaD CT Angio Nov 2023 but prior thtat HRCT in 2018   PLAN  - full PFT  - ILD queestionnaire  - to see me 30 min slot ASAP - if PFT is too far out - just bring her in to see me first      No data to display             Latest Reference Range & Units 12/12/21 13:53  Anti Nuclear Antibody (ANA) Negative  Positive !  CCP Antibodies IgG/IgA 0 - 19 units <1  RA Latex Turbid. <14.0 IU/mL 123.3 (H)  Cytoplasmic (C-ANCA) Neg:<1:20 titer <1:20  P-ANCA Neg:<1:20 titer <1:20  Atypical P-ANCA titer Neg:<1:20 titer <1:20  Scleroderma (Scl-70) (ENA) Antibody, IgG 0.0 - 0.9 AI <0.2  !: Data is abnormal (H): Data is abnormally high

## 2022-02-13 NOTE — Telephone Encounter (Signed)
Spanish interpreter, ID [phone] left voicemail for patient to call back to schedule consult.

## 2022-02-24 NOTE — Telephone Encounter (Signed)
Pt has been contacted by front staff several times asking to return call to schedule new patient visit. Closing encounter per office protocol.

## 2022-03-21 ENCOUNTER — Inpatient Hospital Stay
Admission: EM | Admit: 2022-03-21 | Discharge: 2022-03-23 | DRG: 197 | Disposition: A | Discharge status: Home / Self Care | Payer: Medicare Other | Attending: Student | Admitting: Student

## 2022-03-21 ENCOUNTER — Emergency Department: Payer: Medicare Other

## 2022-03-21 ENCOUNTER — Other Ambulatory Visit: Payer: Self-pay

## 2022-03-21 DIAGNOSIS — J811 Chronic pulmonary edema: Secondary | ICD-10-CM | POA: Diagnosis present

## 2022-03-21 DIAGNOSIS — J8410 Pulmonary fibrosis, unspecified: Principal | ICD-10-CM | POA: Diagnosis present

## 2022-03-21 DIAGNOSIS — R0600 Dyspnea, unspecified: Secondary | ICD-10-CM | POA: Diagnosis present

## 2022-03-21 DIAGNOSIS — M79601 Pain in right arm: Secondary | ICD-10-CM | POA: Diagnosis present

## 2022-03-21 DIAGNOSIS — Z8249 Family history of ischemic heart disease and other diseases of the circulatory system: Secondary | ICD-10-CM

## 2022-03-21 DIAGNOSIS — Z9981 Dependence on supplemental oxygen: Secondary | ICD-10-CM

## 2022-03-21 DIAGNOSIS — E871 Hypo-osmolality and hyponatremia: Secondary | ICD-10-CM | POA: Diagnosis present

## 2022-03-21 DIAGNOSIS — Z1152 Encounter for screening for COVID-19: Secondary | ICD-10-CM | POA: Diagnosis not present

## 2022-03-21 DIAGNOSIS — E860 Dehydration: Secondary | ICD-10-CM | POA: Diagnosis present

## 2022-03-21 DIAGNOSIS — I272 Pulmonary hypertension, unspecified: Secondary | ICD-10-CM | POA: Diagnosis present

## 2022-03-21 DIAGNOSIS — R079 Chest pain, unspecified: Secondary | ICD-10-CM

## 2022-03-21 DIAGNOSIS — Z79890 Hormone replacement therapy (postmenopausal): Secondary | ICD-10-CM

## 2022-03-21 DIAGNOSIS — J9611 Chronic respiratory failure with hypoxia: Secondary | ICD-10-CM | POA: Diagnosis present

## 2022-03-21 DIAGNOSIS — E785 Hyperlipidemia, unspecified: Secondary | ICD-10-CM | POA: Diagnosis present

## 2022-03-21 DIAGNOSIS — Z791 Long term (current) use of non-steroidal anti-inflammatories (NSAID): Secondary | ICD-10-CM | POA: Diagnosis not present

## 2022-03-21 DIAGNOSIS — M3500 Sicca syndrome, unspecified: Secondary | ICD-10-CM | POA: Diagnosis present

## 2022-03-21 DIAGNOSIS — I700 Atherosclerosis of aorta: Secondary | ICD-10-CM | POA: Diagnosis present

## 2022-03-21 DIAGNOSIS — M349 Systemic sclerosis, unspecified: Secondary | ICD-10-CM | POA: Diagnosis present

## 2022-03-21 DIAGNOSIS — R0609 Other forms of dyspnea: Secondary | ICD-10-CM | POA: Diagnosis not present

## 2022-03-21 DIAGNOSIS — R053 Chronic cough: Secondary | ICD-10-CM | POA: Diagnosis present

## 2022-03-21 DIAGNOSIS — M25511 Pain in right shoulder: Secondary | ICD-10-CM | POA: Diagnosis present

## 2022-03-21 DIAGNOSIS — N179 Acute kidney failure, unspecified: Secondary | ICD-10-CM | POA: Diagnosis present

## 2022-03-21 DIAGNOSIS — E039 Hypothyroidism, unspecified: Secondary | ICD-10-CM | POA: Diagnosis present

## 2022-03-21 DIAGNOSIS — J849 Interstitial pulmonary disease, unspecified: Secondary | ICD-10-CM | POA: Diagnosis present

## 2022-03-21 DIAGNOSIS — R531 Weakness: Secondary | ICD-10-CM

## 2022-03-21 DIAGNOSIS — I2510 Atherosclerotic heart disease of native coronary artery without angina pectoris: Secondary | ICD-10-CM | POA: Diagnosis present

## 2022-03-21 DIAGNOSIS — T502X5A Adverse effect of carbonic-anhydrase inhibitors, benzothiadiazides and other diuretics, initial encounter: Secondary | ICD-10-CM | POA: Diagnosis present

## 2022-03-21 DIAGNOSIS — I10 Essential (primary) hypertension: Secondary | ICD-10-CM | POA: Diagnosis present

## 2022-03-21 DIAGNOSIS — Z87891 Personal history of nicotine dependence: Secondary | ICD-10-CM | POA: Diagnosis not present

## 2022-03-21 DIAGNOSIS — I5033 Acute on chronic diastolic (congestive) heart failure: Principal | ICD-10-CM

## 2022-03-21 LAB — URINALYSIS, W/ REFLEX TO CULTURE (INFECTION SUSPECTED)
Bilirubin Urine: NEGATIVE
Glucose, UA: NEGATIVE mg/dL
Hgb urine dipstick: NEGATIVE
Ketones, ur: 5 mg/dL — AB
Leukocytes,Ua: NEGATIVE
Nitrite: NEGATIVE
Protein, ur: 30 mg/dL — AB
Specific Gravity, Urine: 1.021 (ref 1.005–1.030)
pH: 5 (ref 5.0–8.0)

## 2022-03-21 LAB — RESP PANEL BY RT-PCR (RSV, FLU A&B, COVID)  RVPGX2
Influenza A by PCR: NEGATIVE
Influenza B by PCR: NEGATIVE
Resp Syncytial Virus by PCR: NEGATIVE
SARS Coronavirus 2 by RT PCR: NEGATIVE

## 2022-03-21 LAB — BASIC METABOLIC PANEL
Anion gap: 12 (ref 5–15)
BUN: 23 mg/dL (ref 8–23)
CO2: 25 mmol/L (ref 22–32)
Calcium: 9.2 mg/dL (ref 8.9–10.3)
Chloride: 91 mmol/L — ABNORMAL LOW (ref 98–111)
Creatinine, Ser: 1.41 mg/dL — ABNORMAL HIGH (ref 0.44–1.00)
GFR, Estimated: 39 mL/min — ABNORMAL LOW (ref >60)
Glucose, Bld: 122 mg/dL — ABNORMAL HIGH (ref 70–99)
Potassium: 4 mmol/L (ref 3.5–5.1)
Sodium: 133 mmol/L — ABNORMAL LOW (ref 135–145)

## 2022-03-21 LAB — CBC
HCT: 36.3 % (ref 36.0–46.0)
Hemoglobin: 11.4 g/dL — ABNORMAL LOW (ref 12.0–15.0)
MCH: 27.8 pg (ref 26.0–34.0)
MCHC: 31.4 g/dL (ref 30.0–36.0)
MCV: 88.5 fL (ref 80.0–100.0)
Platelets: 351 10*3/uL (ref 150–400)
RBC: 4.1 MIL/uL (ref 3.87–5.11)
RDW: 15.7 % — ABNORMAL HIGH (ref 11.5–15.5)
WBC: 9.8 10*3/uL (ref 4.0–10.5)
nRBC: 0 % (ref 0.0–0.2)

## 2022-03-21 LAB — PROCALCITONIN: Procalcitonin: <0.1 ng/mL

## 2022-03-21 LAB — TROPONIN I (HIGH SENSITIVITY)
Troponin I (High Sensitivity): 6 ng/L (ref <18)
Troponin I (High Sensitivity): 6 ng/L (ref <18)

## 2022-03-21 LAB — BRAIN NATRIURETIC PEPTIDE: B Natriuretic Peptide: 259.6 pg/mL — ABNORMAL HIGH (ref 0.0–100.0)

## 2022-03-21 MED ORDER — PREDNISOLONE ACETATE 1 % OP SUSP
1.0000 [drp] | Freq: Two times a day (BID) | OPHTHALMIC | Status: DC
  Administered 2022-03-21 – 2022-03-23 (×4): 1 [drp] via OPHTHALMIC
  Filled 2022-03-21: qty 1

## 2022-03-21 MED ORDER — SODIUM CHLORIDE 0.9 % IV BOLUS
1000.0000 mL | Freq: Once | INTRAVENOUS | Status: AC
  Administered 2022-03-21: 1000 mL via INTRAVENOUS

## 2022-03-21 MED ORDER — ACETAMINOPHEN 325 MG PO TABS
650.0000 mg | ORAL_TABLET | Freq: Four times a day (QID) | ORAL | Status: DC | PRN
  Administered 2022-03-21 – 2022-03-22 (×3): 650 mg via ORAL
  Filled 2022-03-21 (×3): qty 2

## 2022-03-21 MED ORDER — LORAZEPAM 2 MG/ML IJ SOLN: 0.5000 mg | Freq: Four times a day (QID) | INTRAMUSCULAR | Status: DC | PRN

## 2022-03-21 MED ORDER — ONDANSETRON HCL 4 MG/2ML IJ SOLN: 4.0000 mg | Freq: Four times a day (QID) | INTRAMUSCULAR | Status: DC | PRN

## 2022-03-21 MED ORDER — IOHEXOL 350 MG/ML SOLN
60.0000 mL | Freq: Once | INTRAVENOUS | Status: AC | PRN
  Administered 2022-03-21: 60 mL via INTRAVENOUS

## 2022-03-21 MED ORDER — SODIUM CHLORIDE 0.9 % IV BOLUS: 1000.0000 mL | Freq: Once | INTRAVENOUS | Status: DC

## 2022-03-21 MED ORDER — ATORVASTATIN CALCIUM 20 MG PO TABS
20.0000 mg | ORAL_TABLET | Freq: Every day | ORAL | Status: DC
  Administered 2022-03-21 – 2022-03-22 (×2): 20 mg via ORAL
  Filled 2022-03-21 (×2): qty 1

## 2022-03-21 MED ORDER — HYDRALAZINE HCL 20 MG/ML IJ SOLN: 5.0000 mg | Freq: Three times a day (TID) | INTRAMUSCULAR | Status: DC | PRN

## 2022-03-21 MED ORDER — PILOCARPINE HCL 5 MG PO TABS
5.0000 mg | ORAL_TABLET | Freq: Three times a day (TID) | ORAL | Status: DC
  Administered 2022-03-21 – 2022-03-23 (×5): 5 mg via ORAL
  Filled 2022-03-21 (×7): qty 1

## 2022-03-21 MED ORDER — METHYLPREDNISOLONE SODIUM SUCC 125 MG IJ SOLR
80.0000 mg | Freq: Every day | INTRAMUSCULAR | Status: DC
  Administered 2022-03-21: 80 mg via INTRAVENOUS
  Filled 2022-03-21: qty 2

## 2022-03-21 MED ORDER — LEVOTHYROXINE SODIUM 100 MCG PO TABS
100.0000 ug | ORAL_TABLET | Freq: Every day | ORAL | Status: DC
  Administered 2022-03-22 – 2022-03-23 (×2): 100 ug via ORAL
  Filled 2022-03-21 (×2): qty 1

## 2022-03-21 MED ORDER — LIDOCAINE 5 % EX PTCH
2.0000 | MEDICATED_PATCH | Freq: Every day | CUTANEOUS | Status: DC | PRN
  Administered 2022-03-21: 2 via TRANSDERMAL
  Filled 2022-03-21 (×2): qty 2

## 2022-03-21 MED ORDER — IPRATROPIUM-ALBUTEROL 0.5-2.5 (3) MG/3ML IN SOLN
3.0000 mL | Freq: Four times a day (QID) | RESPIRATORY_TRACT | Status: DC
  Administered 2022-03-21 – 2022-03-22 (×3): 3 mL via RESPIRATORY_TRACT
  Filled 2022-03-21 (×3): qty 3

## 2022-03-21 MED ORDER — ENOXAPARIN SODIUM 30 MG/0.3ML IJ SOSY
30.0000 mg | PREFILLED_SYRINGE | INTRAMUSCULAR | Status: DC
  Administered 2022-03-21 – 2022-03-22 (×2): 30 mg via SUBCUTANEOUS
  Filled 2022-03-21 (×2): qty 0.3

## 2022-03-21 MED ORDER — FUROSEMIDE 10 MG/ML IJ SOLN
20.0000 mg | Freq: Once | INTRAMUSCULAR | Status: AC
  Administered 2022-03-21: 20 mg via INTRAVENOUS
  Filled 2022-03-21: qty 4

## 2022-03-21 MED ORDER — SENNOSIDES-DOCUSATE SODIUM 8.6-50 MG PO TABS: 1.0000 | ORAL_TABLET | Freq: Every evening | ORAL | Status: DC | PRN

## 2022-03-21 MED ORDER — ACETAMINOPHEN 500 MG PO TABS
1000.0000 mg | ORAL_TABLET | Freq: Once | ORAL | Status: AC
  Administered 2022-03-21: 1000 mg via ORAL
  Filled 2022-03-21: qty 2

## 2022-03-21 MED ORDER — ACETAMINOPHEN 650 MG RE SUPP: 650.0000 mg | Freq: Four times a day (QID) | RECTAL | Status: DC | PRN

## 2022-03-21 MED ORDER — ONDANSETRON HCL 4 MG PO TABS: 4.0000 mg | ORAL_TABLET | Freq: Four times a day (QID) | ORAL | Status: DC | PRN

## 2022-03-21 MED ORDER — SODIUM CHLORIDE 0.9 % IV SOLN: INTRAVENOUS | Status: AC

## 2022-03-21 NOTE — Assessment & Plan Note (Addendum)
Patient would benefit from rheumatology consultation evaluation outpatient, per outpatient pulmonology note, patient has been referred to rheumatology Pilocarpine 5 mg p.o. 3 times daily resumed Prednisone ophthalmic solution, 1 drop in the left eye twice daily resumed

## 2022-03-21 NOTE — Assessment & Plan Note (Addendum)
Etiology workup in progress Check procalcitonin Check COVID/today/influenza B/RSV PCR and 20 pathogen respiratory panel Solu-Medrol 80 mg IV daily, 2 doses ordered with first dose on day of admission Strict I's and O's Admit to telemetry cardiac, inpatient

## 2022-03-21 NOTE — Assessment & Plan Note (Signed)
Levothyroxine 100 mcg daily before breakfast resumed 

## 2022-03-21 NOTE — H&P (Signed)
History and Physical   Denise [name] [id number] DOB: 09/18/1945 DOA: 03/21/2022  PCP: Kerri Perches, PA-C  Outpatient Specialists: Dr. Jill Poling pulmonology Patient coming from: Home via [place]  I have personally briefly reviewed patient's old medical records in [place].  Chief Concern: Shortness of breath  HPI: [name] is a 77 year old female with history of interstitial lung disease with pulmonary fibrosis, pulmonary hypertension, restrictive lung disease hypothyroid, hyperlipidemia, connective tissue disease and Sjogren's diagnosis, who presents to the emergency department for chief concerns of shortness of breath.  Vitals in the ED showed temperature 98.2, respiration rate of 24, heart rate 80, blood pressure 104/60, SpO2 of 97% on 3 L nasal cannula.  Serum sodium is 133, potassium 4.0, chloride 91, bicarb 25, BUN of 23, serum creatinine 1.41/eGFR 39, nonfasting blood glucose 122, WBC 9.8, hemoglobin 11.4, platelets of 351.  BNP was elevated to 59.6.  High sensitive troponin is 6.  UA was negative for leukocytes and nitrates.  CTA PE: Was read as suggestive of acute congestive heart failure superimposed on a background of advanced pulmonary fibrosis.  No evidence of acute PE.  Significantly enlarged pulmonary artery consistent with pulmonary arterial hypertension.  Aortic and coronary calcifications.  Cardiomegaly.  Chronic congestive/reactive mediastinal and bilateral hilar lymphadenopathy.  Aortic atherosclerosis.  CT abdomen pelvis with contrast: No acute abdominal pelvic findings.  Hepatic nodular surface contour, suggestive of cirrhosis.  Colonic diverticulosis without evidence of acute diverticulitis.  ED treatment: Acetaminophen 1000 mg p.o. one-time dose, furosemide 20 mg IV one-time dose, sodium chloride 1 L bolus. ------------------------------ At bedside, patietn is able to tell me her name. She does not know the current  year.  She states she feels better currently.  She reports her chest has been hurting with breathing.  She states this has been going on for about 15 days.  She denies known sick contacts. She denies trauma to her person, mva. She reports her breathing is better after receiving the breathing treatments.  She reports she does not have breathing treatments at home including inhalers or nebulizers.  She reports she wears oxygen regularly and has been compliant with her home prescribed medications.  She reports poor p.o. intake for the last 15 days.  She reports she has been taking the meloxicam that has been prescribed to her.  She denies dysuria, hematuria, diarrhea.  Social history: She lives with her daughter, Denise [name]. She denies tobacco use, etoh.   ROS: Constitutional: no weight change, no fever ENT/Mouth: no sore throat, no rhinorrhea Eyes: no eye pain, no vision changes Cardiovascular: no chest pain, + dyspnea,  no edema, no palpitations Respiratory: no cough, no sputum, no wheezing Gastrointestinal: no nausea, no vomiting, no diarrhea, no constipation Genitourinary: no urinary incontinence, no dysuria, no hematuria Musculoskeletal: no arthralgias, no myalgias Skin: no skin lesions, no pruritus, Neuro: + weakness, no loss of consciousness, no syncope Psych: no anxiety, no depression, + decrease appetite Heme/Lymph: no bruising, no bleeding  ED Course: Discussed with emergency medicine provider, patient requiring hospitalization for chief concerns of pulmonary congestion complicated by advanced pulmonary fibrosis and interstitial lung disease.  Assessment/Plan  Principal Problem:   Dyspnea on exertion Active Problems:   AKI (acute kidney injury) ([place])   Sjogren's disease (HCC)   Hypothyroidism   Pulmonary fibrosis (HCC)   Chronic respiratory failure with hypoxia (HCC)   Essential hypertension   Hyponatremia   Severe pulmonary hypertension (HCC)   Assessment and Plan:  *  Dyspnea on exertion Etiology workup in progress Check procalcitonin Check COVID/today/influenza B/RSV PCR and 20 pathogen respiratory panel Solu-Medrol 80 mg IV daily, 2 doses ordered with first dose on day of admission Strict I's and O's Admit to telemetry cardiac, inpatient  AKI (acute kidney injury) ([place]) Presumed secondary to intravascular depletion complicated by meloxicam use Hold meloxicam 7.5 mg daily on admission and avoid nephrotoxic agents Status post furosemide 20 mg IV one-time dose per EDP and sodium chloride 1 L bolus per EDP Strict I's and O's Repeat BMP in a.m.  Sjogren's disease ([place]) Patient would benefit from rheumatology consultation evaluation outpatient, per outpatient pulmonology note, patient has been referred to rheumatology Pilocarpine 5 mg p.o. 3 times daily resumed Prednisone ophthalmic solution, 1 drop in the left eye twice daily resumed  Hypothyroidism Levothyroxine 100 mcg daily before breakfast resumed  Severe pulmonary hypertension ([place]) CPAP nightly ordered  Hyponatremia Mild and asymptomatic  Essential hypertension Hydralazine 5 mg IV every 8 hours as needed for SBP greater than 175, 5 days ordered  Chronic respiratory failure with hypoxia (HCC) Continue O2 saturation to maintain SpO2 greater than 92%  Pulmonary fibrosis ([place]) Routine consultation placed to pulmonologist via epic order and timed staff message for consideration of antifibrotic therapy  Chart reviewed.   DVT prophylaxis: Enoxaparin Code Status: Full code Diet: Heart healthy Family Communication: Updated daughter [name] at bedside Disposition Plan: Pending clinical course, guarded prognosis Consults called: Pulmonology Admission status: Telemetry cardiac, inpatient  Past Medical History:  Diagnosis Date   Hypertension    Pulmonary fibrosis ([place])    Pulmonary hypertension ([place])    a. TTE 10/19:  EF of 65-70%, mild LVH, no RWMA, normal LV diastolic function, mildly  dilated LA, RVSF cavity size normal with normal RVSF, mildly dilated RA, PASP 45 mmHg, dilated IVC consistent with elevated CVP, trivial pericardial effusion was noted   Sjogren's disease ([place])    Thyroid disease    Past Surgical History:  Procedure Laterality Date   RIGHT HEART CATH Right 12/18/2021   Procedure: RIGHT HEART CATH;  Surgeon: Jolaine Artist, MD;  Location: [facility];  Service: Cardiovascular;  Laterality: Right;   Social History:  reports that she has quit smoking. Her smoking use included cigarettes. She has never used smokeless tobacco. She reports that she does not drink alcohol and does not use drugs.  Allergies  Allergen Reactions   Oxycodone Itching   Family History  Problem Relation Age of Onset   Hypertension Daughter    Family history: Family history reviewed and not pertinent  Prior to Admission medications   Medication Sig Start Date End Date Taking? Authorizing Provider  meloxicam (MOBIC) 7.5 MG tablet Take 7.5 mg by mouth daily. 02/26/22  Yes [provider]  albuterol (PROVENTIL) (2.5 MG/3ML) 0.083% nebulizer solution Take 3 mLs (2.5 mg total) by nebulization every 6 (six) hours as needed for wheezing or shortness of breath. 11/04/20   Flora Lipps, MD  atorvastatin (LIPITOR) 20 MG tablet Take 20 mg by mouth daily.    [provider]  furosemide (LASIX) 20 MG tablet Take 1 tablet (20 mg total) by mouth every Monday, Wednesday, and Friday. 12/19/21 12/19/22  Bensimhon, Shaune Pascal, MD  guaiFENesin-codeine 100-10 MG/5ML syrup Take 5 mLs by mouth every 4 (four) hours as needed for cough. 11/01/20   Danford, Suann Larry, MD  levothyroxine (SYNTHROID, LEVOTHROID) 100 MCG tablet Take 1 tablet (100 mcg total) by mouth daily at 6 (six) AM. 11/09/17  Epifanio Lesches, MD  pilocarpine (SALAGEN) 5 MG tablet Take 5 mg by mouth 3 (three) times daily.    [provider]  prednisoLONE acetate (PRED FORTE) 1 % ophthalmic suspension  Place 1 drop into the left eye 2 (two) times daily. 11/05/21   [provider]  Respiratory Therapy Supplies (FLUTTER) DEVI 1 each by Does not apply route 4 (four) times daily. 12/07/17   Flora Lipps, MD  senna-docusate (SENOKOT-S) 8.6-50 MG tablet Take 1 tablet by mouth at bedtime.    [provider]  VENTOLIN HFA 108 (90 Base) MCG/ACT inhaler Inhale 1-2 puffs into the lungs every 6 (six) hours as needed for wheezing or shortness of breath. 12/01/21   [provider]   Physical Exam: Vitals:   03/21/22 1246 03/21/22 1600 03/21/22 1700 03/21/22 1800  BP: 104/60 (!) 108/58 136/79 (!) 105/51  Pulse: 80 71 85 79  Resp:  '20 17 18  '$ Temp:  98.2 F (36.8 C)  98 F (36.7 C)  SpO2: 97% 99% 100% 100%   Constitutional: appears frail, NAD, calm, comfortable Eyes: PERRL, lids and conjunctivae normal ENMT: Mucous membranes are moist. Posterior pharynx clear of any exudate or lesions. Age-appropriate dentition. Hearing appropriate Neck: normal, supple, no masses, no thyromegaly Respiratory: Generalized decreased lung sounds bilaterally, end expiratory wheezing, no crackles. Normal respiratory effort. No accessory muscle use.  Cardiovascular: Regular rate and rhythm, no murmurs / rubs / gallops. No extremity edema. 2+ pedal pulses. No carotid bruits.  Abdomen: no tenderness, no masses palpated, no hepatosplenomegaly. Bowel sounds positive.  Musculoskeletal: no clubbing / cyanosis. No joint deformity upper and lower extremities. Good ROM, no contractures, no atrophy. Normal muscle tone.  Skin: no rashes, lesions, ulcers. No induration Neurologic: Sensation intact. Strength 5/5 in all 4.  Psychiatric: Normal judgment and insight. Alert and oriented x 3. Normal mood.   EKG: independently reviewed, showing sinus rhythm with rate of 87 frequent PVCs, QTc 488  Chest x-ray on Admission: I personally reviewed and I agree with radiologist reading as below.  CT Abdomen Pelvis W  Contrast  Result Date: 03/21/2022 CLINICAL DATA:  Abdominal pain, acute, nonlocalized EXAM: CT ABDOMEN AND PELVIS WITH CONTRAST TECHNIQUE: Multidetector CT imaging of the abdomen and pelvis was performed using the standard protocol following bolus administration of intravenous contrast. RADIATION DOSE REDUCTION: This exam was performed according to the departmental dose-optimization program which includes automated exposure control, adjustment of the mA and/or kV according to patient size and/or use of iterative reconstruction technique. CONTRAST:  61m OMNIPAQUE IOHEXOL 350 MG/ML SOLN COMPARISON:  10/31/2012 FINDINGS: Lower chest: See dedicated CT of the chest. Hepatobiliary: Subtly nodular hepatic surface contour. No focal liver lesion is identified. Gallbladder is partially contracted. No hyperdense gallstone. No biliary dilatation. Pancreas: Unremarkable. No pancreatic ductal dilatation or surrounding inflammatory changes. Spleen: Normal in size without focal abnormality. Adrenals/Urinary Tract: Unremarkable adrenal glands. Kidneys enhance symmetrically without focal lesion, stone, or hydronephrosis. Ureters are nondilated. Urinary bladder is decompressed, limiting its evaluation. Stomach/Bowel: Stomach is within normal limits. Appendix appears normal. No evidence of bowel wall thickening, distention, or inflammatory changes. Scattered colonic diverticulosis. Vascular/Lymphatic: Aortic atherosclerosis. No enlarged abdominal or pelvic lymph nodes. Reproductive: Uterus and bilateral adnexa are unremarkable. Other: No free fluid. No abdominopelvic fluid collection. No pneumoperitoneum. No abdominal wall hernia. Musculoskeletal: No acute or significant osseous findings. IMPRESSION: 1. No acute abdominopelvic findings. 2. Subtly nodular hepatic surface contour, suggestive of cirrhosis. Correlate with liver function tests. 3. Colonic diverticulosis without evidence of acute  diverticulitis. 4. Aortic atherosclerosis  (ICD10-I70.0). Electronically Signed   By: Davina Poke D.O.   On: 03/21/2022 15:10   DG Humerus Right  Result Date: 03/21/2022 CLINICAL DATA:  Right shoulder pain after falling 1 month previously EXAM: RIGHT HUMERUS - 2+ VIEW COMPARISON:  Concurrently obtained radiographs of the right shoulder FINDINGS: There is no evidence of fracture or other focal bone lesions. Soft tissues are unremarkable. Diffuse interstitial prominence throughout the visualized right lung. IMPRESSION: Negative. Pulmonary edema. Electronically Signed   By: Jacqulynn Cadet M.D.   On: 03/21/2022 15:10   DG Shoulder Right  Result Date: 03/21/2022 CLINICAL DATA:  Persistent right shoulder pain after falling 1 month previously EXAM: RIGHT SHOULDER - 2+ VIEW COMPARISON:  Concurrently obtained radiographs of the right humerus FINDINGS: There is no evidence of fracture or dislocation. There is no evidence of arthropathy or other focal bone abnormality. Soft tissues are unremarkable. Mild degenerative changes of the acromioclavicular joint. Diffuse interstitial prominence throughout the lungs consistent with pulmonary edema superimposed on pulmonary fibrosis. IMPRESSION: 1. No evidence of fracture or malalignment. 2. 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 3. Pulmonary edema. Electronically Signed   By: Jacqulynn Cadet M.D.   On: 03/21/2022 15:09   CT Angio Chest PE W/Cm &/Or Wo Cm  Result Date: 03/21/2022 CLINICAL DATA:  Short of breath, left-sided rib pain. Possible pulmonary embolus. EXAM: CT ANGIOGRAPHY CHEST WITH CONTRAST TECHNIQUE: Multidetector CT imaging of the chest was performed using the standard protocol during bolus administration of intravenous contrast. Multiplanar CT image reconstructions and MIPs were obtained to evaluate the vascular anatomy. RADIATION DOSE REDUCTION: This exam was performed according to the departmental dose-optimization program which includes automated exposure control,  adjustment of the mA and/or kV according to patient size and/or use of iterative reconstruction technique. CONTRAST:  82m OMNIPAQUE IOHEXOL 350 MG/ML SOLN COMPARISON:  Prior CT scan of the chest 12/02/2021 FINDINGS: Cardiovascular: Excellent opacification of the pulmonary arteries to the segmental level. No evidence of pulmonary embolus. Significantly enlarged main pulmonary artery at 3.6 cm. The aortic root is normal in caliber. Ectatic ascending thoracic aorta without true aneurysmal dilation. Calcifications present throughout the thoracic aorta and coronary arteries. Cardiomegaly. No pericardial effusion. Mediastinum/Nodes: Similar appearance of nonspecific mediastinal and bilateral hilar lymphadenopathy compared to prior imaging. Likely chronic/reactive/congestive in nature. Lungs/Pleura: Interlobular septal thickening. Extensive ground-glass attenuation airspace opacity throughout all lobes of both lungs. Diffuse bronchial wall thickening. Bilateral bibasilar honeycombing suggests pulmonary fibrosis. Upper Abdomen: No acute abnormality. See concurrently obtained dedicated CT scan of the abdomen and pelvis. Musculoskeletal: No acute fracture or aggressive appearing lytic or blastic osseous lesion. Review of the MIP images confirms the above findings. IMPRESSION: 1. CT findings are most suggestive of acute congestive heart failure superimposed on a background of advanced pulmonary fibrosis. 2. No evidence of acute pulmonary embolus. 3. Significantly enlarged main pulmonary artery at 3.6 cm in diameter consistent with pulmonary arterial hypertension. 4. Aortic and coronary artery calcifications. 5. Cardiomegaly. 6. Chronic congestive/reactive mediastinal and bilateral hilar lymphadenopathy. Aortic Atherosclerosis (ICD10-I70.0). Electronically Signed   By: HJacqulynn CadetM.D.   On: 03/21/2022 15:06   DG Chest 2 View  Result Date: 03/21/2022 CLINICAL DATA:  Left rib pain. Chronic oxygen requirement chest  pain. EXAM: CHEST - 2 VIEW COMPARISON:  Chest radiograph 12/18/2021 FINDINGS: Monitoring leads overlie the patient. Stable cardiac and mediastinal contours. Aortic atherosclerosis. Redemonstrated coarse interstitial opacities bilaterally, similar to prior exam. Possible small left pleural effusion. No pneumothorax. Thoracic spine degenerative  changes. IMPRESSION: Similar appearance of coarse interstitial opacities bilaterally consistent with pulmonary fibrosis. No definite acute superimposed process. Electronically Signed   By: Lovey Newcomer M.D.   On: 03/21/2022 13:28    Labs on Admission: I have personally reviewed following labs  CBC: Recent Labs  Lab 03/21/22 1329  WBC 9.8  HGB 11.4*  HCT 36.3  MCV 88.5  PLT [id number]   Basic Metabolic Panel: Recent Labs  Lab 03/21/22 1329  NA 133*  K 4.0  CL 91*  CO2 25  GLUCOSE 122*  BUN 23  CREATININE 1.41*  CALCIUM 9.2   GFR: CrCl cannot be calculated (Unknown ideal weight.).  Urine analysis:    Component Value Date/Time   COLORURINE AMBER (A) 03/21/2022 1412   APPEARANCEUR HAZY (A) 03/21/2022 1412   LABSPEC 1.021 03/21/2022 1412   PHURINE 5.0 03/21/2022 1412   GLUCOSEU NEGATIVE 03/21/2022 1412   HGBUR NEGATIVE 03/21/2022 1412   BILIRUBINUR NEGATIVE 03/21/2022 1412   KETONESUR 5 (A) 03/21/2022 1412   PROTEINUR 30 (A) 03/21/2022 1412   NITRITE NEGATIVE 03/21/2022 1412   LEUKOCYTESUR NEGATIVE 03/21/2022 1412   This document was prepared using Dragon Voice Recognition software and may include unintentional dictation errors.  Dr. Tobie Poet [facility]  If 7PM-7AM, please contact overnight-coverage provider If 7AM-7PM, please contact day coverage provider [email or web address]  03/21/2022, 6:16 PM

## 2022-03-21 NOTE — ED Provider Notes (Signed)
[facility] Provider Note    Event Date/Time   First MD Initiated Contact with Patient 03/21/22 1331     (approximate)   History   Shortness of Breath and Chest Pain Formal Spanish translating service utilized  HPI  Denise [name] is a 77 y.o. 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  Patient endorses 15 days of left-sided chest pain.  Sharp stabbing pain to the left lower chest wall that is nonradiating and constant.  Also complaining of abdominal pain.  Severe pain the first 2 days and has been having ongoing pain since that time.  No progressively worsening shortness of breath at this time.  Does endorse an ongoing cough which is her chronic cough and not worsening.  No fever or chills.  Fall approximately 1 month ago when she landed on her right side.  Ongoing right shoulder and arm pain since that time.  Denies any head injury.  States that she has had progressively worsening generalized weakness over the past couple of weeks.  Patient was ambulating with a cane but now is having more difficulty secondary to generalized weakness.  Poor p.o. intake and decreased urine output.  No prior history of DVT or PE.  Not on anticoagulation.  Denies any vomiting or diarrhea.  No constipation.  Followed by pulmonology.     Physical Exam   Triage Vital Signs: ED Triage Vitals  Enc Vitals Group     BP 03/21/22 1246 104/60     Pulse Rate 03/21/22 1246 80     Resp --      Temp --      Temp src --      SpO2 03/21/22 1246 97 %     Weight --      Height --      Head Circumference --      Peak Flow --      Pain Score 03/21/22 1247 9     Pain Loc --      Pain Edu? --      Excl. in [place]? --     Most recent vital signs: Vitals:   03/21/22 1246  BP:  104/60  Pulse: 80  SpO2: 97%    Physical Exam Constitutional:      Comments: Thin female, daughter at bedside  HENT:     Head: Atraumatic.     Comments: Dry mucous membranes Eyes:     Conjunctiva/sclera: Conjunctivae normal.     Pupils: Pupils are equal, round, and reactive to light.  Cardiovascular:     Rate and Rhythm: Regular rhythm.     Heart sounds: No murmur heard. Pulmonary:     Breath sounds: Rhonchi and rales present.     Comments: 3 L nasal cannula with tachypnea.  Coarse breath sounds throughout all lung fields.  No wheezing. Abdominal:     General: There is no distension.     Tenderness: There is abdominal tenderness (Diffuse abdominal tenderness to palpation with no rebound or guarding.  No CVA ttp.).  Musculoskeletal:     Right shoulder: Tenderness present.     Cervical back: Normal range of motion.     Right lower leg: No edema.     Left lower leg: No edema.     Comments: Pain with active and passive range  of motion to the right shoulder.  Underlying bony tenderness.  Skin:    General: Skin is warm.     Capillary Refill: Capillary refill takes 2 to 3 seconds.  Neurological:     General: No focal deficit present.     Mental Status: She is alert. Mental status is at baseline.     IMPRESSION / MDM / ASSESSMENT AND PLAN / ED COURSE  I reviewed the triage vital signs and the nursing notes.  Differential diagnosis including progression of interstitial lung disease, pneumonia, anemia, pulmonary embolism, ACS, dehydration, UTI.  EKG  I, Nathaniel Man, the attending physician, personally viewed and interpreted this ECG.   Rate: Normal  Rhythm: Normal sinus  Axis: Normal  Intervals: Normal  ST&T Change: Flattening of T waves to the septal leads No significant change when compared to prior EKG  No tachycardic or bradycardic dysrhythmias while on cardiac telemetry.  RADIOLOGY I independently reviewed imaging, my interpretation of imaging: Chest x-ray  without significant change when compared to prior chest x-rays, consistent with pulmonary interstitial fibrosis with scarring.  CT angiography and CT abdomen and pelvis with contrast -severe interstitial lung disease.  Read as signs of pulmonary edema with signs of heart failure.  CT abdomen and pelvis without acute findings.  LABS (all labs ordered are listed, but only abnormal results are displayed) Labs interpreted as -    Labs Reviewed  CBC - Abnormal; Notable for the following components:      Result Value   Hemoglobin 11.4 (*)    RDW 15.7 (*)    All other components within normal limits  BRAIN NATRIURETIC PEPTIDE - Abnormal; Notable for the following components:   B Natriuretic Peptide 259.6 (*)    All other components within normal limits  BASIC METABOLIC PANEL - Abnormal; Notable for the following components:   Sodium 133 (*)    Chloride 91 (*)    Glucose, Bld 122 (*)    Creatinine, Ser 1.41 (*)    GFR, Estimated 39 (*)    All other components within normal limits  URINALYSIS, W/ REFLEX TO CULTURE (INFECTION SUSPECTED) - Abnormal; Notable for the following components:   Color, Urine AMBER (*)    APPearance HAZY (*)    Ketones, ur 5 (*)    Protein, ur 30 (*)    Bacteria, UA RARE (*)    All other components within normal limits  TROPONIN I (HIGH SENSITIVITY)  TROPONIN I (HIGH SENSITIVITY)    TREATMENT  1 L of IV fluids, IV Lasix  MDM  Patient appears clinically dehydrated with dry mucosal membranes, given 1 L of IV fluids and will reevaluate.  Improvement of her blood pressure following a liter of fluids.  Findings concerning for heart failure exacerbation on CT scan.  Given 1 dose of IV Lasix.  Clinical picture concerning for progression of interstitial lung disease, pulmonary edema and pulmonary hypertension.  Consulted hospitalist for admission.     PROCEDURES:  Critical Care performed: No  Procedures  Patient's presentation is most consistent with acute  presentation with potential threat to life or bodily function.   MEDICATIONS ORDERED IN ED: Medications  furosemide (LASIX) injection 20 mg (has no administration in time range)  sodium chloride 0.9 % bolus 1,000 mL (0 mLs Intravenous Stopped 03/21/22 1517)  iohexol (OMNIPAQUE) 350 MG/ML injection 60 mL (60 mLs Intravenous Contrast Given 03/21/22 1441)  acetaminophen (TYLENOL) tablet 1,000 mg (1,000 mg Oral Given 03/21/22 1509)    FINAL CLINICAL IMPRESSION(S) /  ED DIAGNOSES   Final diagnoses:  Chest pain, unspecified type  Weakness  Dehydration     Rx / DC Orders   ED Discharge Orders     None        Note:  This document was prepared using Dragon voice recognition software and may include unintentional dictation errors.   Nathaniel Man, MD 03/21/22 1538

## 2022-03-21 NOTE — Progress Notes (Signed)
PHARMACIST - PHYSICIAN COMMUNICATION  CONCERNING:  Enoxaparin (Lovenox) for DVT Prophylaxis    RECOMMENDATION: Patient was prescribed enoxaprin '40mg'$  q24 hours for VTE prophylaxis.   Filed Weights   03/21/22 2039  Weight: 60.1 kg (132 lb 9.6 oz)    Body mass index is 25.05 kg/m.  Estimated Creatinine Clearance: 29.1 mL/min (A) (by C-G formula based on SCr of 1.41 mg/dL (H)).  Patient is candidate for enoxaparin '30mg'$  every 24 hours based on CrCl <53m/min or Weight <45kg  DESCRIPTION: [facility]  Patient is now receiving enoxaparin 30 mg every 24 hours   HLorin Picket PharmD Clinical Pharmacist  03/21/2022 8:46 PM

## 2022-03-21 NOTE — Assessment & Plan Note (Signed)
-   Hydralazine 5 mg IV every 8 hours as needed for SBP greater than 175, 5 days ordered 

## 2022-03-21 NOTE — ED Notes (Signed)
Pt eating dinner at this time. Will obtain covid swab and give duoneb after patient has finished

## 2022-03-21 NOTE — Assessment & Plan Note (Signed)
-   CPAP nightly ordered 

## 2022-03-21 NOTE — Assessment & Plan Note (Addendum)
Presumed secondary to intravascular depletion complicated by meloxicam use Hold meloxicam 7.5 mg daily on admission and avoid nephrotoxic agents Status post furosemide 20 mg IV one-time dose per EDP and sodium chloride 1 L bolus per EDP Strict I's and O's Repeat BMP in a.m.

## 2022-03-21 NOTE — Hospital Course (Addendum)
[name] is a 76 year old female with history of interstitial lung disease with pulmonary fibrosis, pulmonary hypertension, restrictive lung disease hypothyroid, hyperlipidemia, connective tissue disease and Sjogren's diagnosis, who presents to the emergency department for chief concerns of shortness of breath.  Vitals in the ED showed temperature 98.2, respiration rate of 24, heart rate 80, blood pressure 104/60, SpO2 of 97% on 3 L nasal cannula.  Serum sodium is 133, potassium 4.0, chloride 91, bicarb 25, BUN of 23, serum creatinine 1.41/eGFR 39, nonfasting blood glucose 122, WBC 9.8, hemoglobin 11.4, platelets of 351.  BNP was elevated to 59.6.  High sensitive troponin is 6.  UA was negative for leukocytes and nitrates.  CTA PE: Was read as suggestive of acute congestive heart failure superimposed on a background of advanced pulmonary fibrosis.  No evidence of acute PE.  Significantly enlarged pulmonary artery consistent with pulmonary arterial hypertension.  Aortic and coronary calcifications.  Cardiomegaly.  Chronic congestive/reactive mediastinal and bilateral hilar lymphadenopathy.  Aortic atherosclerosis.  CT abdomen pelvis with contrast: No acute abdominal pelvic findings.  Hepatic nodular surface contour, suggestive of cirrhosis.  Colonic diverticulosis without evidence of acute diverticulitis.  ED treatment: Acetaminophen 1000 mg p.o. one-time dose, furosemide 20 mg IV one-time dose, sodium chloride 1 L bolus.

## 2022-03-21 NOTE — Assessment & Plan Note (Signed)
Continue O2 saturation to maintain SpO2 greater than 92%

## 2022-03-21 NOTE — Assessment & Plan Note (Signed)
-   Mild and asymptomatic 

## 2022-03-21 NOTE — ED Notes (Signed)
Called for transport for patient 

## 2022-03-21 NOTE — Assessment & Plan Note (Signed)
Routine consultation placed to pulmonologist via epic order and timed staff message for consideration of antifibrotic therapy

## 2022-03-21 NOTE — ED Triage Notes (Signed)
Pt presents via POV c/o SOB and left sided rib pain x15 days. Pt wears chronic 02. Reports seen MD and prescribed Meloxicam x15 days ago.

## 2022-03-22 DIAGNOSIS — R0600 Dyspnea, unspecified: Secondary | ICD-10-CM | POA: Diagnosis not present

## 2022-03-22 DIAGNOSIS — J8410 Pulmonary fibrosis, unspecified: Secondary | ICD-10-CM | POA: Diagnosis not present

## 2022-03-22 DIAGNOSIS — R0609 Other forms of dyspnea: Secondary | ICD-10-CM | POA: Diagnosis not present

## 2022-03-22 LAB — CBC
HCT: 35.1 % — ABNORMAL LOW (ref 36.0–46.0)
Hemoglobin: 11.1 g/dL — ABNORMAL LOW (ref 12.0–15.0)
MCH: 28 pg (ref 26.0–34.0)
MCHC: 31.6 g/dL (ref 30.0–36.0)
MCV: 88.4 fL (ref 80.0–100.0)
Platelets: 331 10*3/uL (ref 150–400)
RBC: 3.97 MIL/uL (ref 3.87–5.11)
RDW: 15.5 % (ref 11.5–15.5)
WBC: 6.7 10*3/uL (ref 4.0–10.5)
nRBC: 0 % (ref 0.0–0.2)

## 2022-03-22 LAB — RESPIRATORY PANEL BY PCR

## 2022-03-22 LAB — BASIC METABOLIC PANEL
Anion gap: 9 (ref 5–15)
BUN: 22 mg/dL (ref 8–23)
CO2: 22 mmol/L (ref 22–32)
Calcium: 7.8 mg/dL — ABNORMAL LOW (ref 8.9–10.3)
Chloride: 105 mmol/L (ref 98–111)
Creatinine, Ser: 1.18 mg/dL — ABNORMAL HIGH (ref 0.44–1.00)
GFR, Estimated: 48 mL/min — ABNORMAL LOW (ref >60)
Glucose, Bld: 150 mg/dL — ABNORMAL HIGH (ref 70–99)
Potassium: 4 mmol/L (ref 3.5–5.1)
Sodium: 136 mmol/L (ref 135–145)

## 2022-03-22 MED ORDER — SODIUM CHLORIDE 0.9 % IV SOLN
500.0000 mg | INTRAVENOUS | Status: DC
  Administered 2022-03-22 – 2022-03-23 (×2): 500 mg via INTRAVENOUS
  Filled 2022-03-22 (×2): qty 5

## 2022-03-22 MED ORDER — BUDESONIDE 0.5 MG/2ML IN SUSP
0.5000 mg | Freq: Two times a day (BID) | RESPIRATORY_TRACT | Status: DC
  Administered 2022-03-22 – 2022-03-23 (×3): 0.5 mg via RESPIRATORY_TRACT
  Filled 2022-03-22 (×3): qty 2

## 2022-03-22 MED ORDER — GUAIFENESIN ER 600 MG PO TB12
600.0000 mg | ORAL_TABLET | Freq: Two times a day (BID) | ORAL | Status: DC
  Administered 2022-03-22 – 2022-03-23 (×2): 600 mg via ORAL
  Filled 2022-03-22 (×2): qty 1

## 2022-03-22 MED ORDER — IPRATROPIUM-ALBUTEROL 0.5-2.5 (3) MG/3ML IN SOLN
3.0000 mL | Freq: Three times a day (TID) | RESPIRATORY_TRACT | Status: DC
  Administered 2022-03-22 – 2022-03-23 (×3): 3 mL via RESPIRATORY_TRACT
  Filled 2022-03-22 (×3): qty 3

## 2022-03-22 MED ORDER — FUROSEMIDE 10 MG/ML IJ SOLN
20.0000 mg | Freq: Two times a day (BID) | INTRAMUSCULAR | Status: DC
  Administered 2022-03-22 – 2022-03-23 (×2): 20 mg via INTRAVENOUS
  Filled 2022-03-22 (×2): qty 2

## 2022-03-22 MED ORDER — SODIUM CHLORIDE 0.9 % IV SOLN
2.0000 g | INTRAVENOUS | Status: DC
  Administered 2022-03-22 – 2022-03-23 (×2): 2 g via INTRAVENOUS
  Filled 2022-03-22: qty 20
  Filled 2022-03-22: qty 2

## 2022-03-22 MED ORDER — METHYLPREDNISOLONE SODIUM SUCC 40 MG IJ SOLR
40.0000 mg | Freq: Two times a day (BID) | INTRAMUSCULAR | Status: DC
  Administered 2022-03-22 – 2022-03-23 (×3): 40 mg via INTRAVENOUS
  Filled 2022-03-22 (×3): qty 1

## 2022-03-22 NOTE — Progress Notes (Signed)
[facility] Progress Note  Patient: Denise [name]    [id number]  DOA: 03/21/2022     Date of Service: the patient was seen and examined on 03/22/2022  Chief Complaint  Patient presents with   Shortness of Breath   Chest Pain   Brief hospital course: [name] is a 77 year old female with history of interstitial lung disease with pulmonary fibrosis, pulmonary hypertension, restrictive lung disease hypothyroid, hyperlipidemia, connective tissue disease and Sjogren's diagnosis, who presents to the emergency department for chief concerns of shortness of breath.   Vitals in the ED showed temperature 98.2, respiration rate of 24, heart rate 80, blood pressure 104/60, SpO2 of 97% on 3 L nasal cannula.   Serum sodium is 133, potassium 4.0, chloride 91, bicarb 25, BUN of 23, serum creatinine 1.41/eGFR 39, nonfasting blood glucose 122, WBC 9.8, hemoglobin 11.4, platelets of 351.   BNP was elevated to 59.6.  High sensitive troponin is 6.   UA was negative for leukocytes and nitrates.   CTA PE: Was read as suggestive of acute congestive heart failure superimposed on a background of advanced pulmonary fibrosis.  No evidence of acute PE.  Significantly enlarged pulmonary artery consistent with pulmonary arterial hypertension.  Aortic and coronary calcifications.  Cardiomegaly.  Chronic congestive/reactive mediastinal and bilateral hilar lymphadenopathy.  Aortic atherosclerosis.   CT abdomen pelvis with contrast: No acute abdominal pelvic findings.  Hepatic nodular surface contour, suggestive of cirrhosis.  Colonic diverticulosis without evidence of acute diverticulitis.   ED treatment: Acetaminophen 1000 mg p.o. one-time dose, furosemide 20 mg IV one-time dose, sodium chloride 1 L bolus.   Assessment and Plan: Principal Problem:   Dyspnea on exertion Active Problems:   AKI (acute kidney injury) ([place])   Sjogren's disease (HCC)   Hypothyroidism    Pulmonary fibrosis (HCC)   Chronic respiratory failure with hypoxia (HCC)   Essential hypertension   Hyponatremia   Severe pulmonary hypertension (HCC)   * Dyspnea on exertion Etiology workup in progress procalcitonin <0.1 negative Negative COVID/influenza B/RSV PCR and 20 pathogen respiratory panel Solu-Medrol 80 mg IV daily, 2 doses ordered with first dose on day of admission Strict I's and O's Started Mucinex twice daily Pulmonary consult appreciated   Pulmonary fibrosis ([place]) Routine consultation placed to pulmonologist via epic order and timed staff message for consideration of antifibrotic therapy   AKI (acute kidney injury) ([place]) Presumed secondary to intravascular depletion complicated by meloxicam use Hold meloxicam 7.5 mg daily on admission and avoid nephrotoxic agents S/p furosemide 20 mg IV one-time dose per EDP and sodium chloride 1 L bolus per EDP Strict I's and O's Cr 1.41--1.18 3/10 Lasix 20 mg IV twice daily ordered Repeat BMP in a.m.    Sjogren's disease ([place]) Patient would benefit from rheumatology consultation evaluation outpatient, per outpatient pulmonology note, patient has been referred to rheumatology Pilocarpine 5 mg p.o. 3 times daily resumed Prednisone ophthalmic solution, 1 drop in the left eye twice daily resumed   Hypothyroidism Levothyroxine 100 mcg daily before breakfast resumed   Severe pulmonary hypertension ([place]) CPAP nightly ordered   Hyponatremia Mild and asymptomatic   Essential hypertension Hydralazine 5 mg IV every 8 hours as needed for SBP greater than 175, 5 days ordered   Chronic respiratory failure with hypoxia  Continue O2 saturation to maintain SpO2 greater than 92%     Body mass index is 25.05 kg/m.  Interventions:   Diet: Heart healthy diet DVT Prophylaxis: Subcutaneous Lovenox  Advance goals of care discussion: Full code  Family Communication: family was present at bedside, at the time of interview.  The  pt provided permission to discuss medical plan with the family. Opportunity was given to ask question and all questions were answered satisfactorily.   Disposition:  Pt is from Home, admitted with Resp failure, sob, still has SOB, which precludes a safe discharge. Discharge to Home, when clinically stable.  Subjective: No significant events overnight, patient is still having mild cough with small amount of phlegm production, breathing has improved, currently patient is back to her baseline 3 L oxygen.  Denies any chest pain or palpitations, no any other active issues   Physical Exam: General: NAD, lying comfortably Appear in no distress, affect appropriate Eyes: PERRLA ENT: Oral Mucosa Clear, moist  Neck: no JVD,  Cardiovascular: S1 and S2 Present, no Murmur,  Respiratory: good respiratory effort, Bilateral Air entry equal, mild crackles bilateral, no significant wheezing appreciated Abdomen: Bowel Sound present, Soft and no tenderness,  Skin: no rashes Extremities: no Pedal edema, no calf tenderness Neurologic: without any new focal findings Gait not checked due to patient safety concerns  Vitals:   03/22/22 1354 03/22/22 1400 03/22/22 1500 03/22/22 1600  BP:  116/65 (!) 111/58 (!) 108/58  Pulse: 75 99 84 78  Resp: 16 20 (!) 21 20  Temp:    98.6 F (37 C)  TempSrc:    Oral  SpO2: 96% 98% 95% 98%  Weight:        Intake/Output Summary (Last 24 hours) at 03/22/2022 1724 Last data filed at 03/22/2022 1638 Gross per 24 hour  Intake 1065.29 ml  Output 850 ml  Net 215.29 ml   Filed Weights   03/21/22 2039  Weight: 60.1 kg    Data Reviewed: I have personally reviewed and interpreted daily labs, tele strips, imagings as discussed above. I reviewed all nursing notes, pharmacy notes, vitals, pertinent old records I have discussed plan of care as described above with RN and patient/family.  CBC: Recent Labs  Lab 03/21/22 1329 03/22/22 0404  WBC 9.8 6.7  HGB 11.4* 11.1*   HCT 36.3 35.1*  MCV 88.5 88.4  PLT 351 [id number]   Basic Metabolic Panel: Recent Labs  Lab 03/21/22 1329 03/22/22 0404  NA 133* 136  K 4.0 4.0  CL 91* 105  CO2 25 22  GLUCOSE 122* 150*  BUN 23 22  CREATININE 1.41* 1.18*  CALCIUM 9.2 7.8*    Studies: No results found.  Scheduled Meds:  atorvastatin  20 mg Oral QHS   budesonide (PULMICORT) nebulizer solution  0.5 mg Nebulization BID   enoxaparin (LOVENOX) injection  30 mg Subcutaneous Q24H   ipratropium-albuterol  3 mL Nebulization TID   levothyroxine  100 mcg Oral QAC breakfast   methylPREDNISolone (SOLU-MEDROL) injection  40 mg Intravenous Q12H   pilocarpine  5 mg Oral TID   prednisoLONE acetate  1 drop Left Eye BID   Continuous Infusions:  azithromycin 500 mg (03/22/22 1053)   cefTRIAXone (ROCEPHIN)  IV 2 g (03/22/22 1013)   PRN Meds: acetaminophen **OR** acetaminophen, hydrALAZINE, lidocaine, LORazepam, ondansetron **OR** ondansetron (ZOFRAN) IV, senna-docusate  Time spent: 35 minutes  Author: Val Riles. MD [facility] 03/22/2022 5:24 PM  To reach On-call, see care teams to locate the attending and reach out to them via [email or web address]. If 7PM-7AM, please contact night-coverage If you still have difficulty reaching the attending provider, please page the [facility] (Director on Call) for [facility] on amion for  assistance.

## 2022-03-22 NOTE — Plan of Care (Signed)
CT chest reviewed  B/l interstitial lung disease with GGO, underlying fibrosis  Recommend therapy with IV abx IV steroids BD therapy Oxygen as needed  Patient can follow up as outpatient with [facility]

## 2022-03-22 NOTE — Progress Notes (Signed)
Cpap not initiated at this time. Pt has no hx of OSA or cpap usage. Family was apprehensive about benefits of initiating cpap

## 2022-03-23 DIAGNOSIS — J8410 Pulmonary fibrosis, unspecified: Secondary | ICD-10-CM | POA: Diagnosis not present

## 2022-03-23 DIAGNOSIS — R0609 Other forms of dyspnea: Secondary | ICD-10-CM | POA: Diagnosis not present

## 2022-03-23 DIAGNOSIS — R0600 Dyspnea, unspecified: Secondary | ICD-10-CM | POA: Diagnosis not present

## 2022-03-23 LAB — CBC
HCT: 35 % — ABNORMAL LOW (ref 36.0–46.0)
Hemoglobin: 11 g/dL — ABNORMAL LOW (ref 12.0–15.0)
MCH: 27.5 pg (ref 26.0–34.0)
MCHC: 31.4 g/dL (ref 30.0–36.0)
MCV: 87.5 fL (ref 80.0–100.0)
Platelets: 354 10*3/uL (ref 150–400)
RBC: 4 MIL/uL (ref 3.87–5.11)
RDW: 15.7 % — ABNORMAL HIGH (ref 11.5–15.5)
WBC: 10.9 10*3/uL — ABNORMAL HIGH (ref 4.0–10.5)
nRBC: 0 % (ref 0.0–0.2)

## 2022-03-23 LAB — BASIC METABOLIC PANEL
Anion gap: 5 (ref 5–15)
BUN: 28 mg/dL — ABNORMAL HIGH (ref 8–23)
CO2: 24 mmol/L (ref 22–32)
Calcium: 8.2 mg/dL — ABNORMAL LOW (ref 8.9–10.3)
Chloride: 103 mmol/L (ref 98–111)
Creatinine, Ser: 1.28 mg/dL — ABNORMAL HIGH (ref 0.44–1.00)
GFR, Estimated: 44 mL/min — ABNORMAL LOW (ref >60)
Glucose, Bld: 141 mg/dL — ABNORMAL HIGH (ref 70–99)
Potassium: 4.2 mmol/L (ref 3.5–5.1)
Sodium: 132 mmol/L — ABNORMAL LOW (ref 135–145)

## 2022-03-23 LAB — MAGNESIUM: Magnesium: 2.1 mg/dL (ref 1.7–2.4)

## 2022-03-23 LAB — PHOSPHORUS: Phosphorus: 4 mg/dL (ref 2.5–4.6)

## 2022-03-23 MED ORDER — IPRATROPIUM-ALBUTEROL 0.5-2.5 (3) MG/3ML IN SOLN: 3.0000 mL | Freq: Two times a day (BID) | RESPIRATORY_TRACT | Status: DC

## 2022-03-23 MED ORDER — LEVOFLOXACIN 500 MG PO TABS: 500.0000 mg | ORAL_TABLET | Freq: Every day | ORAL | 0 refills | Status: AC

## 2022-03-23 MED ORDER — ACETAMINOPHEN 325 MG PO TABS: 650.0000 mg | ORAL_TABLET | Freq: Three times a day (TID) | ORAL | Status: AC | PRN

## 2022-03-23 MED ORDER — PREDNISONE 10 MG PO TABS: ORAL_TABLET | ORAL | 0 refills | Status: AC

## 2022-03-23 NOTE — Plan of Care (Signed)

## 2022-03-23 NOTE — Evaluation (Signed)
Occupational Therapy Evaluation Patient Details Name: Denise [name] MRN: [id number] DOB: 15-May-1945 Today's Date: 03/23/2022   History of Present Illness Pt is a 77 year old female with history of interstitial lung disease with pulmonary fibrosis, pulmonary hypertension, restrictive lung disease hypothyroid, hyperlipidemia, connective tissue disease and Sjogren's disease, who presents to the emergency department for chief concerns of shortness of breath.  MD assessment includes: dyspnea on exertion, AKI, hypothyroidism, severe pulmonary hypertension, hyponatremia, and hronic respiratory failure with hypoxia.   Clinical Impression   Chart reviewed, pt greeted in room and agreeable to OT evaluation. Family present. Pt appears to be performing ADLs and functional mobility at baseline. At this time, pt does not demonstrate any acute OT needs. Pt/family in agreement. Please reconsult if there is a change in functional status.    Recommendations for follow up therapy are one component of a multi-disciplinary discharge planning process, led by the attending physician.  Recommendations may be updated based on patient status, additional functional criteria and insurance authorization.   Follow Up Recommendations  No OT follow up     Assistance Recommended at Discharge Intermittent Supervision/Assistance  Patient can return home with the following Assistance with cooking/housework;Assist for transportation    Functional Status Assessment  Patient has not had a recent decline in their functional status  Equipment Recommendations  None recommended by OT    Recommendations for Other Services       Precautions / Restrictions Precautions Precautions: None Restrictions Weight Bearing Restrictions: No      Mobility Bed Mobility               General bed mobility comments: NT, pt received/left in recliner    Transfers Overall transfer level: Independent Equipment used:  None               General transfer comment: STS from recliner and toilet      Balance Overall balance assessment: No apparent balance deficits (not formally assessed)         ADL either performed or assessed with clinical judgement   ADL Overall ADL's : Modified independent;At baseline           General ADL Comments: Pt at baseline level of function in ADLs (Mod I due to decreased endurance and chronic O2 requirement). Pt completed functional mobility to the bathroom without an AD and toilet transfer from regular height toilet independently.     Vision Patient Visual Report: No change from baseline       Perception     Praxis      Pertinent Vitals/Pain Pain Assessment Pain Assessment: No/denies pain     Hand Dominance Right   Extremity/Trunk Assessment Upper Extremity Assessment Upper Extremity Assessment: Overall WFL for tasks assessed   Lower Extremity Assessment Lower Extremity Assessment: Overall WFL for tasks assessed       Communication Communication Communication: Prefers language other than English;Interpreter utilized   Cognition Arousal/Alertness: Awake/alert Behavior During Therapy: WFL for tasks assessed/performed Overall Cognitive Status: Within Functional Limits for tasks assessed           General Comments  Pt received on 2L O2 via [place], SpO2 >90% at rest. SpO2 dropping to mid 80s with activity and O2 titrated up to 3L. SpO2 improved to >90% with seated rest break and pursed lip breathing.    Exercises Other Exercises Other Exercises: OT provided education re: role of OT, OT POC, post acute recs, sitting up for all meals, EOB/OOB mobility with assistance, home/fall safety.  Shoulder Instructions      Home Living Family/patient expects to be discharged to:: Private residence Living Arrangements: Children Available Help at Discharge: Family;Available 24 hours/day Type of Home: House Home Access: Level entry     Home  Layout: One level               Home Equipment: Cane - single point   Additional Comments: Daughter and SIL present and requested to interpret for patient      Prior Functioning/Environment Prior Level of Function : Independent/Modified Independent             Mobility Comments: Ind with amb without an AD household distances, uses a w/c for community access, no fall history ADLs Comments: Mod I for ADLs, family provides transportation        OT Problem List:        OT Treatment/Interventions:      OT Goals(Current goals can be found in the care plan section) Acute Rehab OT Goals Patient Stated Goal: return home OT Goal Formulation: All assessment and education complete, DC therapy  OT Frequency:      Co-evaluation              AM-PAC OT "6 Clicks" Daily Activity     Outcome Measure Help from another person eating meals?: None Help from another person taking care of personal grooming?: None Help from another person toileting, which includes using toliet, bedpan, or urinal?: None Help from another person bathing (including washing, rinsing, drying)?: None Help from another person to put on and taking off regular upper body clothing?: None Help from another person to put on and taking off regular lower body clothing?: None 6 Click Score: 24   End of Session Equipment Utilized During Treatment: Gait belt;Oxygen Nurse Communication: Mobility status  Activity Tolerance: Patient tolerated treatment well Patient left: in chair;with call bell/phone within reach;with chair alarm set;with family/visitor present  OT Visit Diagnosis: Other abnormalities of gait and mobility (R26.89)                Time: [id number] OT Time Calculation (min): 14 min Charges:  OT General Charges $OT Visit: 1 Visit OT Evaluation $OT Eval Low Complexity: 1 Low  [facility] MS, OTR/L ascom [phone]  03/23/22, 4:55 PM

## 2022-03-23 NOTE — Discharge Summary (Signed)
[facility] Discharge Summary   Patient: Denise [name] [id number]  PCP: Kerri Perches, PA-C  Date of admission: 03/21/2022   Date of discharge:  03/23/2022     Discharge Diagnoses:  Principal Problem:   Dyspnea on exertion Active Problems:   AKI (acute kidney injury) ([place])   Sjogren's disease ([place])   Hypothyroidism   Pulmonary fibrosis ([place])   Chronic respiratory failure with hypoxia ([place])   Essential hypertension   Hyponatremia   Severe pulmonary hypertension ([place])   Admitted From: Home Disposition:  Home   Recommendations for Outpatient Follow-up:  PCP: in 1 wk F/u Pulmo in 1 wk Follow up LABS/TEST:  BMP   Diet recommendation: Cardiac diet  Activity: The patient is advised to gradually reintroduce usual activities, as tolerated  Discharge Condition: stable  Code Status: Full code   History of present illness: As per the H and P dictated on admission Hospital Course:  [name] is a 77 year old female with history of interstitial lung disease with pulmonary fibrosis, pulmonary hypertension, restrictive lung disease hypothyroid, hyperlipidemia, connective tissue disease and Sjogren's diagnosis, who presents to the emergency department for chief concerns of shortness of breath. Vitals in the ED showed temperature 98.2, respiration rate of 24, heart rate 80, blood pressure 104/60, SpO2 of 97% on 3 L nasal cannula. [id number] sodium is 133, potassium 4.0, chloride 91, bicarb 25, BUN of 23, serum creatinine 1.41/eGFR 39, nonfasting blood glucose 122, WBC 9.8, hemoglobin 11.4, platelets of 351. BNP was elevated to 59.6.  High sensitive troponin is 6. UA was negative for leukocytes and nitrates. CTA PE: Was read as suggestive of acute congestive heart failure superimposed on a background of advanced pulmonary fibrosis.  No evidence of acute PE.  Significantly enlarged pulmonary artery consistent with pulmonary arterial hypertension.   Aortic and coronary calcifications.  Cardiomegaly.  Chronic congestive/reactive mediastinal and bilateral hilar lymphadenopathy.  Aortic atherosclerosis. CT abdomen pelvis with contrast: No acute abdominal pelvic findings.  Hepatic nodular surface contour, suggestive of cirrhosis.  Colonic diverticulosis without evidence of acute diverticulitis. ED treatment: Acetaminophen 1000 mg p.o. one-time dose, furosemide 20 mg IV one-time dose, sodium chloride 1 L bolus.   Assessment and Plan: # Dyspnea on exertion most likely due to worsening of pulmonary fibrosis and pulmonary edema, procalcitonin <0.1 negative and  COVID/influenza B/RSV PCR and 20 pathogen respiratory panel negative. S/p Solu-Medrol 40 mg IV every 12 hourly, transition to oral prednisone on discharge tapering dose 40 mg for 1 day 30 mg for 1 day, 20 mg for 1 day and 10 mg for 1 day.  Continue albuterol as needed.  Patient was on ceftriaxone and azithromycin, empiric antibiotics for suspected bacterial infection.  Transition to Levaquin 40 mg p.o. daily for 5 days.  Pulmonary consulted.  Follow with PCP and pulmonologist as an outpatient. # Pulmonary fibrosis: Pulmonary consulted, continue prednisone tapering dose, transition to oral antibiotics.  Follow-up with pulmonologist as an outpatient. # AKI (acute kidney injury): Presumed secondary to intravascular depletion complicated by meloxicam use.  Discontinue meloxicam.  Use Tylenol as needed for pain control. S/p furosemide 20 mg IV one-time dose per EDP and sodium chloride 1 L bolus per EDP. Cr 1.41--1.18--1.28 slightly elevated due to dehydration secondary to diuresis. S/p Lasix 20 mg IV BID x 2 doses given. Repeat BMP in 1 wk. continue home dose Lasix 20 mg 3 times a week. # Sjogren's disease: Patient would benefit from rheumatology consultation evaluation outpatient, per outpatient pulmonology note,  patient has been referred to rheumatology. Pilocarpine 5 mg p.o. 3 times daily  resumed Prednisone ophthalmic solution, 1 drop in the left eye twice daily resumed # Hypothyroidism: Levothyroxine 100 mcg daily before breakfast resumed # Severe pulmonary hypertension: CPAP nightly ordered # Hyponatremia, Mild and asymptomatic, repeat BMP after 1 week # History of essential hypertension, blood pressure remained soft, well-controlled.  Patient is not on any medication at home.  Recommend to monitor BP at home and follow with PCP.  # Chronic respiratory failure with hypoxia, Continue O2 saturation to maintain SpO2 greater than 92%.  Case manager was consulted to arrange oxygen concentrator portable for travel by air.  [facility] was contacted and they will do all the set up when patient will be traveling.   Body mass index is 25.05 kg/m.  Nutrition Interventions:   Patient was seen by physical therapy, who recommended no therapy needed on discharge,  On the day of the discharge the patient's vitals were stable, and no other acute medical condition were reported by patient. the patient was felt safe to be discharge at Home .  Consultants: Pulmo Procedures: none  Discharge Exam: General: Appear in no distress, no Rash; Oral Mucosa Clear, moist. Cardiovascular: S1 and S2 Present, no Murmur, Respiratory: normal respiratory effort, Bilateral Air entry present and no Crackles, no wheezes Abdomen: Bowel Sound present, Soft and no tenderness, no hernia Extremities: no Pedal edema, no calf tenderness Neurology: alert and oriented to time, place, and person affect appropriate.  Filed Weights   03/21/22 2039  Weight: 60.1 kg   Vitals:   03/23/22 0842 03/23/22 1224  BP: 121/63 120/75  Pulse: 79 81  Resp: 16 18  Temp: 98.5 F (36.9 C) 98.5 F (36.9 C)  SpO2: 100% 97%    DISCHARGE MEDICATION: Allergies as of 03/23/2022       Reactions   Oxycodone Itching        Medication List     STOP taking these medications    meloxicam 7.5 MG tablet Commonly known  as: MOBIC       TAKE these medications    acetaminophen 325 MG tablet Commonly known as: TYLENOL Take 2 tablets (650 mg total) by mouth every 8 (eight) hours as needed for mild pain, fever, headache or moderate pain (or Fever >/= 101).   albuterol (2.5 MG/3ML) 0.083% nebulizer solution Commonly known as: PROVENTIL Take 3 mLs (2.5 mg total) by nebulization every 6 (six) hours as needed for wheezing or shortness of breath.   Ventolin HFA 108 (90 Base) MCG/ACT inhaler Generic drug: albuterol Inhale 1-2 puffs into the lungs every 6 (six) hours as needed for wheezing or shortness of breath.   atorvastatin 20 MG tablet Commonly known as: LIPITOR Take 20 mg by mouth daily.   Flutter Devi 1 each by Does not apply route 4 (four) times daily.   furosemide 20 MG tablet Commonly known as: Lasix Take 1 tablet (20 mg total) by mouth every Monday, Wednesday, and Friday.   guaiFENesin-codeine 100-10 MG/5ML syrup Take 5 mLs by mouth every 4 (four) hours as needed for cough.   levofloxacin 500 MG tablet Commonly known as: Levaquin Take 1 tablet (500 mg total) by mouth daily for 5 days.   levothyroxine 100 MCG tablet Commonly known as: SYNTHROID Take 1 tablet (100 mcg total) by mouth daily at 6 (six) AM.   pilocarpine 5 MG tablet Commonly known as: SALAGEN Take 5 mg by mouth 3 (three) times daily.   prednisoLONE  acetate 1 % ophthalmic suspension Commonly known as: PRED FORTE Place 1 drop into the left eye 2 (two) times daily.   predniSONE 10 MG tablet Commonly known as: DELTASONE Take 4 tablets (40 mg total) by mouth daily for 1 day, THEN 3 tablets (30 mg total) daily for 1 day, THEN 2 tablets (20 mg total) daily for 1 day, THEN 1 tablet (10 mg total) daily for 1 day. Start taking on: March 23, 2022   senna-docusate 8.6-50 MG tablet Commonly known as: Senokot-S Take 1 tablet by mouth at bedtime.               Durable Medical Equipment  (From admission, onward)            Start     Ordered   03/23/22 1107  For home use only DME oxygen  Once       Comments: Patient needs portable oxygen concentrator. POC for J84.112  Question Answer Comment  Length of Need Lifetime   Mode or (Route) Nasal cannula   Liters per Minute 3   Frequency Continuous (stationary and portable oxygen unit needed)   Oxygen conserving device Yes   Oxygen delivery system Gas      03/23/22 1108           Allergies  Allergen Reactions   Oxycodone Itching   Discharge Instructions     Call MD for:  difficulty breathing, headache or visual disturbances   Complete by: As directed    Call MD for:  temperature >100.4   Complete by: As directed    Diet - low sodium heart healthy   Complete by: As directed    Discharge instructions   Complete by: As directed    Follow-up with PCP in 1 week, repeat BMP after 1 week to check renal functions Follow-up with pulmonologist for further management of pulmonary fibrosis as an outpatient.  Patient has severe pulmonary hypertension and moderate TR, RV systolic failure as well.   Increase activity slowly   Complete by: As directed        The results of significant diagnostics from this hospitalization (including imaging, microbiology, ancillary and laboratory) are listed below for reference.    Significant Diagnostic Studies: CT Abdomen Pelvis W Contrast  Result Date: 03/21/2022 CLINICAL DATA:  Abdominal pain, acute, nonlocalized EXAM: CT ABDOMEN AND PELVIS WITH CONTRAST TECHNIQUE: Multidetector CT imaging of the abdomen and pelvis was performed using the standard protocol following bolus administration of intravenous contrast. RADIATION DOSE REDUCTION: This exam was performed according to the departmental dose-optimization program which includes automated exposure control, adjustment of the mA and/or kV according to patient size and/or use of iterative reconstruction technique. CONTRAST:  89m OMNIPAQUE IOHEXOL 350 MG/ML SOLN  COMPARISON:  10/31/2012 FINDINGS: Lower chest: See dedicated CT of the chest. Hepatobiliary: Subtly nodular hepatic surface contour. No focal liver lesion is identified. Gallbladder is partially contracted. No hyperdense gallstone. No biliary dilatation. Pancreas: Unremarkable. No pancreatic ductal dilatation or surrounding inflammatory changes. Spleen: Normal in size without focal abnormality. Adrenals/Urinary Tract: Unremarkable adrenal glands. Kidneys enhance symmetrically without focal lesion, stone, or hydronephrosis. Ureters are nondilated. Urinary bladder is decompressed, limiting its evaluation. Stomach/Bowel: Stomach is within normal limits. Appendix appears normal. No evidence of bowel wall thickening, distention, or inflammatory changes. Scattered colonic diverticulosis. Vascular/Lymphatic: Aortic atherosclerosis. No enlarged abdominal or pelvic lymph nodes. Reproductive: Uterus and bilateral adnexa are unremarkable. Other: No free fluid. No abdominopelvic fluid collection. No pneumoperitoneum. No abdominal wall hernia. Musculoskeletal: No  acute or significant osseous findings. IMPRESSION: 1. No acute abdominopelvic findings. 2. Subtly nodular hepatic surface contour, suggestive of cirrhosis. Correlate with liver function tests. 3. Colonic diverticulosis without evidence of acute diverticulitis. 4. Aortic atherosclerosis (ICD10-I70.0). Electronically Signed   By: Davina Poke D.O.   On: 03/21/2022 15:10   DG Humerus Right  Result Date: 03/21/2022 CLINICAL DATA:  Right shoulder pain after falling 1 month previously EXAM: RIGHT HUMERUS - 2+ VIEW COMPARISON:  Concurrently obtained radiographs of the right shoulder FINDINGS: There is no evidence of fracture or other focal bone lesions. Soft tissues are unremarkable. Diffuse interstitial prominence throughout the visualized right lung. IMPRESSION: Negative. Pulmonary edema. Electronically Signed   By: Jacqulynn Cadet M.D.   On: 03/21/2022 15:10   DG  Shoulder Right  Result Date: 03/21/2022 CLINICAL DATA:  Persistent right shoulder pain after falling 1 month previously EXAM: RIGHT SHOULDER - 2+ VIEW COMPARISON:  Concurrently obtained radiographs of the right humerus FINDINGS: There is no evidence of fracture or dislocation. There is no evidence of arthropathy or other focal bone abnormality. Soft tissues are unremarkable. Mild degenerative changes of the acromioclavicular joint. Diffuse interstitial prominence throughout the lungs consistent with pulmonary edema superimposed on pulmonary fibrosis. IMPRESSION: 1. No evidence of fracture or malalignment. 2. 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 3. Pulmonary edema. Electronically Signed   By: Jacqulynn Cadet M.D.   On: 03/21/2022 15:09   CT Angio Chest PE W/Cm &/Or Wo Cm  Result Date: 03/21/2022 CLINICAL DATA:  Short of breath, left-sided rib pain. Possible pulmonary embolus. EXAM: CT ANGIOGRAPHY CHEST WITH CONTRAST TECHNIQUE: Multidetector CT imaging of the chest was performed using the standard protocol during bolus administration of intravenous contrast. Multiplanar CT image reconstructions and MIPs were obtained to evaluate the vascular anatomy. RADIATION DOSE REDUCTION: This exam was performed according to the departmental dose-optimization program which includes automated exposure control, adjustment of the mA and/or kV according to patient size and/or use of iterative reconstruction technique. CONTRAST:  75m OMNIPAQUE IOHEXOL 350 MG/ML SOLN COMPARISON:  Prior CT scan of the chest 12/02/2021 FINDINGS: Cardiovascular: Excellent opacification of the pulmonary arteries to the segmental level. No evidence of pulmonary embolus. Significantly enlarged main pulmonary artery at 3.6 cm. The aortic root is normal in caliber. Ectatic ascending thoracic aorta without true aneurysmal dilation. Calcifications present throughout the thoracic aorta and coronary arteries. Cardiomegaly. No  pericardial effusion. Mediastinum/Nodes: Similar appearance of nonspecific mediastinal and bilateral hilar lymphadenopathy compared to prior imaging. Likely chronic/reactive/congestive in nature. Lungs/Pleura: Interlobular septal thickening. Extensive ground-glass attenuation airspace opacity throughout all lobes of both lungs. Diffuse bronchial wall thickening. Bilateral bibasilar honeycombing suggests pulmonary fibrosis. Upper Abdomen: No acute abnormality. See concurrently obtained dedicated CT scan of the abdomen and pelvis. Musculoskeletal: No acute fracture or aggressive appearing lytic or blastic osseous lesion. Review of the MIP images confirms the above findings. IMPRESSION: 1. CT findings are most suggestive of acute congestive heart failure superimposed on a background of advanced pulmonary fibrosis. 2. No evidence of acute pulmonary embolus. 3. Significantly enlarged main pulmonary artery at 3.6 cm in diameter consistent with pulmonary arterial hypertension. 4. Aortic and coronary artery calcifications. 5. Cardiomegaly. 6. Chronic congestive/reactive mediastinal and bilateral hilar lymphadenopathy. Aortic Atherosclerosis (ICD10-I70.0). Electronically Signed   By: HJacqulynn CadetM.D.   On: 03/21/2022 15:06   DG Chest 2 View  Result Date: 03/21/2022 CLINICAL DATA:  Left rib pain. Chronic oxygen requirement chest pain. EXAM: CHEST - 2 VIEW COMPARISON:  Chest radiograph 12/18/2021 FINDINGS:  Monitoring leads overlie the patient. Stable cardiac and mediastinal contours. Aortic atherosclerosis. Redemonstrated coarse interstitial opacities bilaterally, similar to prior exam. Possible small left pleural effusion. No pneumothorax. Thoracic spine degenerative changes. IMPRESSION: Similar appearance of coarse interstitial opacities bilaterally consistent with pulmonary fibrosis. No definite acute superimposed process. Electronically Signed   By: Lovey Newcomer M.D.   On: 03/21/2022 13:28     Microbiology: Recent Results (from the past 240 hour(s))  Resp panel by RT-PCR (RSV, Flu A&B, Covid) Anterior Nasal Swab     Status: None   Collection Time: 03/21/22  5:31 PM   Specimen: Anterior Nasal Swab  Result Value Ref Range Status   SARS Coronavirus 2 by RT PCR NEGATIVE NEGATIVE Final    Comment: (NOTE) SARS-CoV-2 target nucleic acids are NOT DETECTED.  The SARS-CoV-2 RNA is generally detectable in upper respiratory specimens during the acute phase of infection. The lowest concentration of SARS-CoV-2 viral copies this assay can detect is 138 copies/mL. A negative result does not preclude SARS-Cov-2 infection and should not be used as the sole basis for treatment or other patient management decisions. A negative result may occur with  improper specimen collection/handling, submission of specimen other than nasopharyngeal swab, presence of viral mutation(s) within the areas targeted by this assay, and inadequate number of viral copies(<138 copies/mL). A negative result must be combined with clinical observations, patient history, and epidemiological information. The expected result is Negative.  Fact Sheet for Patients:  EntrepreneurPulse.com.au  Fact Sheet for Healthcare Providers:  IncredibleEmployment.be  This test is no t yet approved or cleared by the [place] FDA and  has been authorized for detection and/or diagnosis of SARS-CoV-2 by FDA under an Emergency Use Authorization (EUA). This EUA will remain  in effect (meaning this test can be used) for the duration of the COVID-19 declaration under Section 564(b)(1) of the Act, 21 U.S.C.section 360bbb-3(b)(1), unless the authorization is terminated  or revoked sooner.       Influenza A by PCR NEGATIVE NEGATIVE Final   Influenza B by PCR NEGATIVE NEGATIVE Final    Comment: (NOTE) The Xpert Xpress SARS-CoV-2/FLU/RSV plus assay is intended as an aid in the diagnosis of  influenza from Nasopharyngeal swab specimens and should not be used as a sole basis for treatment. Nasal washings and aspirates are unacceptable for Xpert Xpress SARS-CoV-2/FLU/RSV testing.  Fact Sheet for Patients: EntrepreneurPulse.com.au  Fact Sheet for Healthcare Providers: IncredibleEmployment.be  This test is not yet approved or cleared by the [place] FDA and has been authorized for detection and/or diagnosis of SARS-CoV-2 by FDA under an Emergency Use Authorization (EUA). This EUA will remain in effect (meaning this test can be used) for the duration of the COVID-19 declaration under Section 564(b)(1) of the Act, 21 U.S.C. section 360bbb-3(b)(1), unless the authorization is terminated or revoked.     Resp Syncytial Virus by PCR NEGATIVE NEGATIVE Final    Comment: (NOTE) Fact Sheet for Patients: EntrepreneurPulse.com.au  Fact Sheet for Healthcare Providers: IncredibleEmployment.be  This test is not yet approved or cleared by the [place] FDA and has been authorized for detection and/or diagnosis of SARS-CoV-2 by FDA under an Emergency Use Authorization (EUA). This EUA will remain in effect (meaning this test can be used) for the duration of the COVID-19 declaration under Section 564(b)(1) of the Act, 21 U.S.C. section 360bbb-3(b)(1), unless the authorization is terminated or revoked.  Performed at [facility], [address] [place], [place] [address]   Respiratory (~20 pathogens) panel by  PCR     Status: None   Collection Time: 03/21/22  5:31 PM   Specimen: Anterior Nasal Swab; Respiratory  Result Value Ref Range Status   Adenovirus NOT DETECTED NOT DETECTED Final   Coronavirus 229E NOT DETECTED NOT DETECTED Final    Comment: (NOTE) The Coronavirus on the Respiratory Panel, DOES NOT test for the novel  Coronavirus (2019 nCoV)    Coronavirus HKU1 NOT DETECTED NOT  DETECTED Final   Coronavirus NL63 NOT DETECTED NOT DETECTED Final   Coronavirus OC43 NOT DETECTED NOT DETECTED Final   Metapneumovirus NOT DETECTED NOT DETECTED Final   Rhinovirus / Enterovirus NOT DETECTED NOT DETECTED Final   Influenza A NOT DETECTED NOT DETECTED Final   Influenza B NOT DETECTED NOT DETECTED Final   Parainfluenza Virus 1 NOT DETECTED NOT DETECTED Final   Parainfluenza Virus 2 NOT DETECTED NOT DETECTED Final   Parainfluenza Virus 3 NOT DETECTED NOT DETECTED Final   Parainfluenza Virus 4 NOT DETECTED NOT DETECTED Final   Respiratory Syncytial Virus NOT DETECTED NOT DETECTED Final   Bordetella pertussis NOT DETECTED NOT DETECTED Final   Bordetella Parapertussis NOT DETECTED NOT DETECTED Final   Chlamydophila pneumoniae NOT DETECTED NOT DETECTED Final   Mycoplasma pneumoniae NOT DETECTED NOT DETECTED Final    Comment: Performed at [facility], [address], [place], [place] [address]     Labs: CBC: Recent Labs  Lab 03/21/22 1329 03/22/22 0404 03/23/22 0521  WBC 9.8 6.7 10.9*  HGB 11.4* 11.1* 11.0*  HCT 36.3 35.1* 35.0*  MCV 88.5 88.4 87.5  PLT 351 331 [id number]   Basic Metabolic Panel: Recent Labs  Lab 03/21/22 1329 03/22/22 0404 03/23/22 0521  NA 133* 136 132*  K 4.0 4.0 4.2  CL 91* 105 103  CO2 '25 22 24  '$ GLUCOSE 122* 150* 141*  BUN 23 22 28*  CREATININE 1.41* 1.18* 1.28*  CALCIUM 9.2 7.8* 8.2*  MG  --   --  2.1  PHOS  --   --  4.0   Liver Function Tests: No results for input(s): "AST", "ALT", "ALKPHOS", "BILITOT", "PROT", "ALBUMIN" in the last 168 hours. No results for input(s): "LIPASE", "AMYLASE" in the last 168 hours. No results for input(s): "AMMONIA" in the last 168 hours. Cardiac Enzymes: No results for input(s): "CKTOTAL", "CKMB", "CKMBINDEX", "TROPONINI" in the last 168 hours. BNP (last 3 results) Recent Labs    12/04/21 0427 03/21/22 1329  BNP 1,433.3* 259.6*   CBG: No results for input(s): "GLUCAP" in the last 168  hours.  Time spent: 35 minutes  Signed:  Val Riles  [facility]  03/23/2022 4:36 PM

## 2022-03-23 NOTE — Evaluation (Signed)
Physical Therapy Evaluation Patient Details Name: Denise [name] MRN: [id number] DOB: 05-02-1945 Today's Date: 03/23/2022  History of Present Illness  Pt is a 77 year old female with history of interstitial lung disease with pulmonary fibrosis, pulmonary hypertension, restrictive lung disease hypothyroid, hyperlipidemia, connective tissue disease and Sjogren's disease, who presents to the emergency department for chief concerns of shortness of breath.  MD assessment includes: dyspnea on exertion, AKI, hypothyroidism, severe pulmonary hypertension, hyponatremia, and hronic respiratory failure with hypoxia.   Clinical Impression  Pt was pleasant and motivated to participate during the session and put forth good effort throughout. Pt Ind with all functional tasks and demonstrated good control and stability with transfers and gait without an AD.  Pt's O2 qualification results as follows:    SpO2 on 2LO2/min at rest: 93% SpO2 on room air at rest = 84% SpO2 on room air while ambulating = NT secondary to 84% at rest on room air SpO2 on 2 liters of O2 while ambulating = 82% SpO2 on 3 liters of O2 while ambulating = 90% Pt left on 2LO2/min at end of session with SpO2 93%, nursing notified.    Pt and family agree that pt is at or very near baseline level functionally with no DME or follow up PT needs.  Will complete PT orders at this time but will reassess pt pending a change in status upon receipt of new PT orders.         Recommendations for follow up therapy are one component of a multi-disciplinary discharge planning process, led by the attending physician.  Recommendations may be updated based on patient status, additional functional criteria and insurance authorization.  Follow Up Recommendations No PT follow up      Assistance Recommended at Discharge Intermittent Supervision/Assistance  Patient can return home with the following  Assist for transportation;Assistance with  cooking/housework    Equipment Recommendations None recommended by PT  Recommendations for Other Services       Functional Status Assessment Patient has not had a recent decline in their functional status     Precautions / Restrictions Precautions Precautions: None Restrictions Weight Bearing Restrictions: No      Mobility  Bed Mobility Overal bed mobility: Independent                  Transfers Overall transfer level: Independent                 General transfer comment: Good eccentric and concentric control and stability with transfers from various surfaces    Ambulation/Gait Ambulation/Gait assistance: Independent Gait Distance (Feet): 60 Feet x 2 Assistive device: None Gait Pattern/deviations: Step-through pattern, Decreased step length - right, Decreased step length - left Gait velocity: decreased     General Gait Details: Slow cadence limited somewhat by this PT having to manage telemetry and O2 tank with patient during gait, steady without LOB  Stairs            Wheelchair Mobility    Modified Rankin (Stroke Patients Only)       Balance Overall balance assessment: No apparent balance deficits (not formally assessed)                                           Pertinent Vitals/Pain Pain Assessment Pain Assessment: No/denies pain    Home Living Family/patient expects to be discharged to:: Private residence  Living Arrangements: Children Available Help at Discharge: Family;Available 24 hours/day Type of Home: House Home Access: Level entry       Home Layout: One level Home Equipment: Cane - single point Additional Comments: Daughter and SIL present and requested to interpret for patient    Prior Function               Mobility Comments: Ind with amb without an AD household distances, uses a w/c for community access, no fall history ADLs Comments: Ind with ADLs     Hand Dominance   Dominant Hand:  Right    Extremity/Trunk Assessment   Upper Extremity Assessment Upper Extremity Assessment: Overall WFL for tasks assessed    Lower Extremity Assessment Lower Extremity Assessment: Overall WFL for tasks assessed       Communication   Communication: Prefers language other than [place];Interpreter utilized  Cognition Arousal/Alertness: Awake/alert Behavior During Therapy: WFL for tasks assessed/performed Overall Cognitive Status: Within Functional Limits for tasks assessed                                          General Comments      Exercises     Assessment/Plan    PT Assessment Patient does not need any further PT services  PT Problem List         PT Treatment Interventions      PT Goals (Current goals can be found in the Care Plan section)  Acute Rehab PT Goals PT Goal Formulation: All assessment and education complete, DC therapy    Frequency       Co-evaluation               AM-PAC PT "6 Clicks" Mobility  Outcome Measure Help needed turning from your back to your side while in a flat bed without using bedrails?: None Help needed moving from lying on your back to sitting on the side of a flat bed without using bedrails?: None Help needed moving to and from a bed to a chair (including a wheelchair)?: None Help needed standing up from a chair using your arms (e.g., wheelchair or bedside chair)?: None Help needed to walk in hospital room?: None Help needed climbing 3-5 steps with a railing? : None 6 Click Score: 24    End of Session Equipment Utilized During Treatment: Gait belt;Oxygen Activity Tolerance: Patient tolerated treatment well Patient left: in chair;with call bell/phone within reach;with chair alarm set;with family/visitor present Nurse Communication: Mobility status;Other (comment) (results of above O2 qualification test) PT Visit Diagnosis: Difficulty in walking, not elsewhere classified (R26.2)    Time:  [id number] PT Time Calculation (min) (ACUTE ONLY): 37 min   Charges:   PT Evaluation $PT Eval Moderate Complexity: 1 Mod PT Treatments $Therapeutic Activity: 8-22 mins       D. Royetta Asal PT, DPT 03/23/22, 3:53 PM

## 2022-03-23 NOTE — Progress Notes (Signed)
At rest in bed, patient's oxygen saturation was 100% on 3 Liters nasal cannula. At rest in bed without oxygen, patient's oxygen saturation was 92%. Patient uses wheelchair at baseline so an ambulation test is not able to be completed.

## 2022-03-23 NOTE — TOC Progression Note (Addendum)
Transition of Care [facility]) - Progression Note    Patient Details  Name: Denise [name] MRN: [id number] Date of Birth: 1945/10/14  [facility]) CM/SW Contact  Laurena Slimmer, RN Phone Number: 03/23/2022, 11:28 AM  Clinical Narrative:     Damaris Schooner with Caryl Pina from [place].  Request for portable concentrator sent.  Portable tank  en route for delivery.  Concentrator will be delivered to patient's home.  Per Caryl Pina sats requested. MD and nurse notified.            Expected Discharge Plan and Services                                               Social Determinants of Health (SDOH) Interventions SDOH Screenings   Food Insecurity: No Food Insecurity (12/03/2021)  Housing: Low Risk  (12/03/2021)  Transportation Needs: No Transportation Needs (12/03/2021)  Utilities: Not At Risk (12/03/2021)  Tobacco Use: Medium Risk (03/21/2022)    Readmission Risk Interventions     No data to display

## 2022-03-23 NOTE — TOC Transition Note (Signed)
Transition of Care [facility]) - CM/SW Discharge Note   Patient Details  Name: Denise [name] MRN: [id number] Date of Birth: 01-06-1946  [facility]) CM/SW Contact:  Laurena Slimmer, RN Phone Number: 03/23/2022, 4:37 PM   Clinical Narrative:    Per Caryl Pina at [facility] oxygen delivered to room Home set will delivered once patient discharges Spoke with family at bedside Advised DME is FAA compliant per Caryl Pina at [facility].   TOC signing off.          Patient Goals and CMS Choice      Discharge Placement                         Discharge Plan and Services Additional resources added to the After Visit Summary for                                       Social Determinants of Health (SDOH) Interventions SDOH Screenings   Food Insecurity: No Food Insecurity (12/03/2021)  Housing: Low Risk  (12/03/2021)  Transportation Needs: No Transportation Needs (12/03/2021)  Utilities: Not At Risk (12/03/2021)  Tobacco Use: Medium Risk (03/21/2022)     Readmission Risk Interventions     No data to display

## 2022-05-08 ENCOUNTER — Telehealth: Payer: Self-pay

## 2022-05-08 NOTE — Telephone Encounter (Signed)
Called pt to schedule recall x follow up appt. Per daughter [place] is currently in [place] Daughter Jearld Adjutant) will call when ready to schedule appt.
